# Patient Record
Sex: Male | Born: 1945 | Race: White | Hispanic: No | Marital: Married | Smoking: Never smoker
Health system: Southern US, Community
[De-identification: ages and names within clinical notes are randomized; demographics above are authoritative.]

## PROBLEM LIST (undated history)

## (undated) DIAGNOSIS — G4730 Sleep apnea, unspecified: Secondary | ICD-10-CM

## (undated) DIAGNOSIS — M1990 Unspecified osteoarthritis, unspecified site: Secondary | ICD-10-CM

## (undated) DIAGNOSIS — I499 Cardiac arrhythmia, unspecified: Secondary | ICD-10-CM

## (undated) DIAGNOSIS — K219 Gastro-esophageal reflux disease without esophagitis: Secondary | ICD-10-CM

## (undated) DIAGNOSIS — F419 Anxiety disorder, unspecified: Secondary | ICD-10-CM

## (undated) DIAGNOSIS — I480 Paroxysmal atrial fibrillation: Secondary | ICD-10-CM

## (undated) DIAGNOSIS — I10 Essential (primary) hypertension: Secondary | ICD-10-CM

## (undated) DIAGNOSIS — E119 Type 2 diabetes mellitus without complications: Secondary | ICD-10-CM

## (undated) DIAGNOSIS — Z86718 Personal history of other venous thrombosis and embolism: Secondary | ICD-10-CM

## (undated) DIAGNOSIS — IMO0001 Reserved for inherently not codable concepts without codable children: Secondary | ICD-10-CM

## (undated) HISTORY — PX: KNEE SURGERY: SHX244

## (undated) HISTORY — PX: 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 SWITCH): SHX6667

## (undated) HISTORY — PX: ROTATOR CUFF REPAIR: SHX139

## (undated) HISTORY — PX: CARDIAC CATHETERIZATION: SHX172

## (undated) HISTORY — PX: PENILE PROSTHESIS IMPLANT: SHX240

## (undated) HISTORY — PX: CHOLECYSTECTOMY: SHX55

---

## 2005-08-13 ENCOUNTER — Other Ambulatory Visit: Payer: Self-pay

## 2005-08-13 ENCOUNTER — Observation Stay: Payer: Self-pay | Admitting: Internal Medicine

## 2006-03-17 ENCOUNTER — Other Ambulatory Visit: Payer: Self-pay

## 2006-03-24 ENCOUNTER — Ambulatory Visit: Payer: Self-pay | Admitting: Specialist

## 2006-05-10 ENCOUNTER — Ambulatory Visit: Payer: Self-pay | Admitting: Specialist

## 2010-06-04 ENCOUNTER — Emergency Department: Payer: Self-pay | Admitting: Emergency Medicine

## 2012-05-16 ENCOUNTER — Ambulatory Visit: Payer: Self-pay | Admitting: Urology

## 2012-05-16 LAB — BASIC METABOLIC PANEL
Calcium, Total: 8.5 mg/dL (ref 8.5–10.1)
Chloride: 105 mmol/L (ref 98–107)
EGFR (African American): >60
EGFR (Non-African Amer.): >60
Glucose: 321 mg/dL — ABNORMAL HIGH (ref 65–99)
Osmolality: 293 (ref 275–301)

## 2012-05-16 LAB — HEMOGLOBIN: HGB: 15.3 g/dL (ref 13.0–18.0)

## 2012-05-20 ENCOUNTER — Encounter (HOSPITAL_COMMUNITY): Payer: Self-pay | Admitting: *Deleted

## 2012-05-20 ENCOUNTER — Emergency Department (HOSPITAL_COMMUNITY)
Admission: EM | Admit: 2012-05-20 | Discharge: 2012-05-20 | Disposition: A | Discharge status: Home / Self Care | Payer: BC Managed Care – PPO | Attending: Emergency Medicine | Admitting: Emergency Medicine

## 2012-05-20 DIAGNOSIS — R109 Unspecified abdominal pain: Secondary | ICD-10-CM

## 2012-05-20 DIAGNOSIS — E162 Hypoglycemia, unspecified: Secondary | ICD-10-CM

## 2012-05-20 DIAGNOSIS — N289 Disorder of kidney and ureter, unspecified: Secondary | ICD-10-CM | POA: Insufficient documentation

## 2012-05-20 DIAGNOSIS — I959 Hypotension, unspecified: Secondary | ICD-10-CM | POA: Insufficient documentation

## 2012-05-20 DIAGNOSIS — E119 Type 2 diabetes mellitus without complications: Secondary | ICD-10-CM | POA: Insufficient documentation

## 2012-05-20 DIAGNOSIS — M129 Arthropathy, unspecified: Secondary | ICD-10-CM | POA: Insufficient documentation

## 2012-05-20 HISTORY — DX: Type 2 diabetes mellitus without complications: E11.9

## 2012-05-20 HISTORY — DX: Unspecified osteoarthritis, unspecified site: M19.90

## 2012-05-20 LAB — CBC
HCT: 44.7 % (ref 39.0–52.0)
Hemoglobin: 15.4 g/dL (ref 13.0–17.0)
MCH: 31 pg (ref 26.0–34.0)
MCHC: 34.5 g/dL (ref 30.0–36.0)
MCV: 89.9 fL (ref 78.0–100.0)
Platelets: 197 10*3/uL (ref 150–400)
RBC: 4.97 MIL/uL (ref 4.22–5.81)
RDW: 13.3 % (ref 11.5–15.5)
WBC: 14.1 10*3/uL — ABNORMAL HIGH (ref 4.0–10.5)

## 2012-05-20 LAB — COMPREHENSIVE METABOLIC PANEL
Albumin: 4.1 g/dL (ref 3.5–5.2)
BUN: 19 mg/dL (ref 6–23)
Creatinine, Ser: 1.87 mg/dL — ABNORMAL HIGH (ref 0.50–1.35)
GFR calc Af Amer: 42 mL/min — ABNORMAL LOW (ref >90)
GFR calc non Af Amer: 36 mL/min — ABNORMAL LOW (ref >90)
Total Bilirubin: 0.6 mg/dL (ref 0.3–1.2)
Total Protein: 6.8 g/dL (ref 6.0–8.3)

## 2012-05-20 LAB — TROPONIN I: Troponin I: <0.3 ng/mL (ref <0.30)

## 2012-05-20 LAB — LIPASE, BLOOD: Lipase: 44 U/L (ref 11–59)

## 2012-05-20 LAB — POCT I-STAT TROPONIN I

## 2012-05-20 NOTE — ED Provider Notes (Signed)
  Physical Exam  BP 114/65  Pulse 85  Temp(Src) 97.1 F (36.2 C) (Oral)  Resp 15  Ht 5\' 8"  (1.727 m)  Wt 271 lb (122.925 kg)  BMI 41.22 kg/m2  SpO2 97%  Physical Exam  ED Course  Procedures  MDM Pt reevaluated - feeling good, has normal VS, labs show no elevation or rise in troponin.  Stable for d/c.        Vida Roller, MD 05/20/12 [phone]

## 2012-05-20 NOTE — ED Provider Notes (Addendum)
D6-year-old male with history of diabetes states that at about 11:30 AM, he had symptoms of hypoglycemia with dizziness and sweating. However, at the same time, he also had upper abdominal cramping pain which was mild. He rated the pain at 4/10. He took several glucose tablets and felt better but blood pressure at the scene was reported to be 82/45. Blood sugar was 238 after his glucose tablets. He has been pain-free since then. He denies chest pain, heaviness, tightness, pressure. Not nausea, vomiting. He has not had any recurrence of pain since his sugar has come back up. On exam, vital signs are significant for tachycardia with heart rate of 101. Oxygen saturation is 95% which is normal. Lungs are clear and heart has regular rate rhythm. Abdomen is soft and nontender without masses or hepatosplenomegaly. Extremities have no cyanosis or edema. He has received a liter of fluid and is blood pressures been stable as far. ECG is unremarkable. He will have cardiac markers checked and repeated in 3 hours and discharged if all negative.   Date: 05/20/2012  Rate: 100  Rhythm: normal sinus rhythm  QRS Axis: left  Intervals: normal  ST/T Wave abnormalities: nonspecific T wave changes  Conduction Disutrbances:none  Narrative Interpretation: Left axis deviation, nonspecific T-wave flattening. No prior ECG available for comparison.  Old EKG Reviewed: none available  Dustin Booze, MD 05/20/12 1338  He has continued to be pain-free in the ED. Troponin is negative and will be repeated. Mild renal insufficiency is noted on metabolic panel. He is not aware of prior history of elevated creatinine so he is referred back to PCP for further evaluation regarding this.  Results for orders placed during the hospital encounter of 05/20/12  CBC      Result Value Range   WBC 14.1 (*) 4.0 - 10.5 K/uL   RBC 4.97  4.22 - 5.81 MIL/uL   Hemoglobin 15.4  13.0 - 17.0 g/dL   HCT 82.9  56.2 - 13.0 %   MCV 89.9  78.0 - 100.0 fL    MCH 31.0  26.0 - 34.0 pg   MCHC 34.5  30.0 - 36.0 g/dL   RDW 86.5  78.4 - 69.6 %   Platelets 197  150 - 400 K/uL  LIPASE, BLOOD      Result Value Range   Lipase 44  11 - 59 U/L  COMPREHENSIVE METABOLIC PANEL      Result Value Range   Sodium 140  135 - 145 mEq/L   Potassium 4.3  3.5 - 5.1 mEq/L   Chloride 104  96 - 112 mEq/L   CO2 22  19 - 32 mEq/L   Glucose, Bld 249 (*) 70 - 99 mg/dL   BUN 19  6 - 23 mg/dL   Creatinine, Ser 2.95 (*) 0.50 - 1.35 mg/dL   Calcium 9.3  8.4 - 28.4 mg/dL   Total Protein 6.8  6.0 - 8.3 g/dL   Albumin 4.1  3.5 - 5.2 g/dL   AST 21  0 - 37 U/L   ALT 19  0 - 53 U/L   Alkaline Phosphatase 76  39 - 117 U/L   Total Bilirubin 0.6  0.3 - 1.2 mg/dL   GFR calc non Af Amer 36 (*) >90 mL/min   GFR calc Af Amer 42 (*) >90 mL/min  CG4 I-STAT (LACTIC ACID)      Result Value Range   Lactic Acid, Venous 1.31  0.5 - 2.2 mmol/L  POCT I-STAT TROPONIN I  Result Value Range   Troponin i, poc 0.01  0.00 - 0.08 ng/mL   Comment 3            Medical screening examination/treatment/procedure(s) were conducted as a shared visit with non-physician practitioner(s) and myself.  I personally evaluated the patient during the encounter    Dustin Booze, MD 05/20/12 [phone]

## 2012-05-20 NOTE — ED Notes (Signed)
Denies abd pain presently. States it was generalized mid abd area, very brief prior to taking instant glucose. Reports was feeling dizzy, lightheaded but now feels fine. Pt with no voiced complaints.  "I feel like I could run a marathon now".

## 2012-05-20 NOTE — ED Notes (Signed)
C/o sudden onset mid abd pain, felt like blood sugar was low, took 2 instant glucose tabs. Upon EMS arrival CBG 238, BP 82/45, HR 100-120 skin clammy, A&OX4. Denies abd pain presently

## 2012-05-20 NOTE — ED Notes (Signed)
Pt undressed, in gown, on monitor, continuous pulse oximetry and blood pressure cuff 

## 2012-05-20 NOTE — ED Provider Notes (Signed)
History     CSN: [id number]  Arrival date & time 05/20/12  1234   First MD Initiated Contact with Patient 05/20/12 1239      Chief Complaint  Patient presents with  . Abdominal Pain  . Hypoglycemia    (Consider location/radiation/quality/duration/timing/severity/associated sxs/prior treatment) Patient is a 67 y.o. male presenting with abdominal pain. The history is provided by the patient. No language interpreter was used.  Abdominal Pain Pain location:  Generalized Pain quality: dull   Pain radiates to:  Does not radiate Pain severity:  Mild Onset quality:  Gradual Timing:  Constant Progression:  Improving Chronicity:  New Context: diet changes   Relieved by: 2 instant glucose tabs. Associated symptoms: no belching, no chest pain, no chills, no diarrhea, no nausea and no vomiting   Pt brought in via EMS for abdominal pain and low blood glucose.  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  States he began experiencing sudden stomach pain around 1100 this morning.  Believed his blood glucose was low so took 2 glucose tabs.  Upon EMS arrival CBG 238, BP 82/45, HR 100-120, skin clammy. Pt denied abdominal pain upon arrival to ED.  States he feels much better "i can run a marathon." PMH: DM.    Past Medical History  Diagnosis Date  . Diabetes mellitus without complication   . Arthritis     History reviewed. No pertinent past surgical history.  No family history on file.  History  Substance Use Topics  . Smoking status: Not on file  . Smokeless tobacco: Not on file  . Alcohol Use: Not on file      Review of Systems  Constitutional: Negative for chills.  Cardiovascular: Negative for chest pain.  Gastrointestinal: Positive for abdominal pain. Negative for nausea, vomiting and diarrhea.    Allergies  Review of patient's allergies indicates no known allergies.  Home Medications   Current Outpatient Rx  Name  Route  Sig  Dispense   Refill  . Dextrose, Diabetic Use, (GLUCOSE PO)   Oral   Take 2 tablets by mouth as needed (for low blood sugar).            BP 111/57  Pulse 79  Temp(Src) 97.1 F (36.2 C) (Oral)  Resp 16  Ht 5\' 8"  (1.727 m)  Wt 271 lb (122.925 kg)  BMI 41.22 kg/m2  SpO2 97%  Physical Exam  Nursing note and vitals reviewed. Constitutional: He appears well-developed and well-nourished. No distress.  Morbidly obese male resting comfortably on exam bed  HENT:  Head: Normocephalic and atraumatic.  Eyes: Conjunctivae are normal.  Neck: Normal range of motion.  Cardiovascular: Normal rate and regular rhythm.   Pulmonary/Chest: Effort normal and breath sounds normal. No respiratory distress. He has no wheezes. He has no rales. He exhibits no tenderness.  Abdominal: Soft. Bowel sounds are normal. He exhibits no distension and no mass. There is tenderness ( mild TTP 3in left of umbilicous ). There is no rebound and no guarding.  Musculoskeletal: Normal range of motion.  Neurological: He is alert.  Skin: Skin is warm and dry. He is not diaphoretic.    ED Course  Procedures (including critical care time)  Labs Reviewed  CBC - Abnormal; Notable for the following:    WBC 14.1 (*)    All other components within normal limits  COMPREHENSIVE METABOLIC PANEL - Abnormal; Notable for the following:    Glucose, Bld 249 (*)  Creatinine, Ser 1.87 (*)    GFR calc non Af Amer 36 (*)    GFR calc Af Amer 42 (*)    All other components within normal limits  LIPASE, BLOOD  TROPONIN I  CG4 I-STAT (LACTIC ACID)  POCT I-STAT TROPONIN I  POCT I-STAT TROPONIN I   No results found.   1. Hypoglycemia   2. Hypotension   3. Abdominal pain   4. Renal insufficiency       MDM  Pt with DM II brought in via EMS for sudden onset abdominal pain. Pt felt his blood sugar was low so took 2 instant glucose tabs.  States by the time EMS arrived he was feeling better.  EMS arrival CBG-238, BP 82/45, HR 100-120 skin  clammy.    Consulted with Dr. Preston Fleeting.  Concerned about initial BP from EMS.  Will observe in ED and obtain labs.   EKG is neg for STEMI  10:31 AM Pt requesting something to eat and drink.  States he still feels just fine and ready to get out of here once we let him know everything is "okay"   Told pt we will be getting another lab (troponin) at 1700.    CBC: leukocytosis  Cr: 1.87  Filed Vitals:   05/20/12 1730  BP: 111/57  Pulse: 79  Temp:   Resp: 16   Will sign out to Dr. Hyacinth Meeker.  Pt will likely be discharge home, pending 2nd troponin.    Junius Finner, PA-C 05/21/12 1031

## 2012-05-22 ENCOUNTER — Ambulatory Visit: Payer: Self-pay | Admitting: Urology

## 2012-10-21 ENCOUNTER — Inpatient Hospital Stay: Payer: Self-pay | Admitting: Internal Medicine

## 2012-10-21 LAB — CBC
HCT: 51.9 % (ref 40.0–52.0)
MCH: 30.6 pg (ref 26.0–34.0)
MCHC: 33.5 g/dL (ref 32.0–36.0)
MCV: 91 fL (ref 80–100)
Platelet: 183 10*3/uL (ref 150–440)
RDW: 14.3 % (ref 11.5–14.5)

## 2012-10-21 LAB — COMPREHENSIVE METABOLIC PANEL
Chloride: 108 mmol/L — ABNORMAL HIGH (ref 98–107)
Creatinine: 2.35 mg/dL — ABNORMAL HIGH (ref 0.60–1.30)
EGFR (Non-African Amer.): 28 — ABNORMAL LOW
Glucose: 209 mg/dL — ABNORMAL HIGH (ref 65–99)
Osmolality: 281 (ref 275–301)
Potassium: 4.7 mmol/L (ref 3.5–5.1)
SGOT(AST): 39 U/L — ABNORMAL HIGH (ref 15–37)
SGPT (ALT): 33 U/L (ref 12–78)
Total Protein: 7.9 g/dL (ref 6.4–8.2)

## 2012-10-21 LAB — URINALYSIS, COMPLETE
Ketone: NEGATIVE
Leukocyte Esterase: NEGATIVE
Ph: 5 (ref 4.5–8.0)
RBC,UR: 1 /HPF (ref 0–5)
Specific Gravity: 1.018 (ref 1.003–1.030)

## 2012-10-21 LAB — TROPONIN I
Troponin-I: 0.03 ng/mL
Troponin-I: 0.05 ng/mL

## 2012-10-22 LAB — LIPID PANEL
Ldl Cholesterol, Calc: 77 mg/dL (ref 0–100)
Triglycerides: 166 mg/dL (ref 0–200)

## 2012-10-22 LAB — BASIC METABOLIC PANEL
Anion Gap: 6 — ABNORMAL LOW (ref 7–16)
Calcium, Total: 7.9 mg/dL — ABNORMAL LOW (ref 8.5–10.1)
Chloride: 110 mmol/L — ABNORMAL HIGH (ref 98–107)
EGFR (Non-African Amer.): 52 — ABNORMAL LOW
Osmolality: 288 (ref 275–301)
Sodium: 140 mmol/L (ref 136–145)

## 2012-10-22 LAB — TROPONIN I: Troponin-I: 0.06 ng/mL — ABNORMAL HIGH

## 2012-10-22 LAB — CBC WITH DIFFERENTIAL/PLATELET
Basophil #: 0.1 10*3/uL (ref 0.0–0.1)
Basophil %: 0.6 %
Eosinophil %: 2.5 %
Lymphocyte #: 0.8 10*3/uL — ABNORMAL LOW (ref 1.0–3.6)
Lymphocyte %: 9.6 %
MCHC: 33.5 g/dL (ref 32.0–36.0)
Monocyte #: 0.9 x10 3/mm (ref 0.2–1.0)
Neutrophil #: 6.7 10*3/uL — ABNORMAL HIGH (ref 1.4–6.5)
Platelet: 143 10*3/uL — ABNORMAL LOW (ref 150–440)
RBC: 4.97 10*6/uL (ref 4.40–5.90)
RDW: 14 % (ref 11.5–14.5)
WBC: 8.7 10*3/uL (ref 3.8–10.6)

## 2013-07-14 ENCOUNTER — Emergency Department: Payer: Self-pay | Admitting: Emergency Medicine

## 2013-07-14 LAB — COMPREHENSIVE METABOLIC PANEL
ALBUMIN: 4.7 g/dL (ref 3.4–5.0)
ALK PHOS: 82 U/L
Anion Gap: 7 (ref 7–16)
BUN: 33 mg/dL — ABNORMAL HIGH (ref 7–18)
Bilirubin,Total: 0.5 mg/dL (ref 0.2–1.0)
CALCIUM: 9.7 mg/dL (ref 8.5–10.1)
CO2: 23 mmol/L (ref 21–32)
CREATININE: 2.34 mg/dL — AB (ref 0.60–1.30)
Chloride: 109 mmol/L — ABNORMAL HIGH (ref 98–107)
EGFR (African American): 33 — ABNORMAL LOW
GFR CALC NON AF AMER: 28 — AB
GLUCOSE: 204 mg/dL — AB (ref 65–99)
Osmolality: 291 (ref 275–301)
POTASSIUM: 4.4 mmol/L (ref 3.5–5.1)
SGOT(AST): 40 U/L — ABNORMAL HIGH (ref 15–37)
SGPT (ALT): 38 U/L (ref 12–78)
SODIUM: 139 mmol/L (ref 136–145)
TOTAL PROTEIN: 8.1 g/dL (ref 6.4–8.2)

## 2013-07-14 LAB — CBC
HCT: 45.4 % (ref 40.0–52.0)
HGB: 15.1 g/dL (ref 13.0–18.0)
MCH: 30.8 pg (ref 26.0–34.0)
MCHC: 33.4 g/dL (ref 32.0–36.0)
MCV: 92 fL (ref 80–100)
Platelet: 212 10*3/uL (ref 150–440)
RBC: 4.92 10*6/uL (ref 4.40–5.90)
RDW: 13.3 % (ref 11.5–14.5)
WBC: 11.7 10*3/uL — AB (ref 3.8–10.6)

## 2013-07-14 LAB — CK: CK, Total: 481 U/L — ABNORMAL HIGH

## 2013-07-24 DIAGNOSIS — E785 Hyperlipidemia, unspecified: Secondary | ICD-10-CM | POA: Insufficient documentation

## 2013-07-24 DIAGNOSIS — E119 Type 2 diabetes mellitus without complications: Secondary | ICD-10-CM | POA: Insufficient documentation

## 2013-07-24 DIAGNOSIS — I10 Essential (primary) hypertension: Secondary | ICD-10-CM | POA: Insufficient documentation

## 2013-08-25 ENCOUNTER — Emergency Department: Payer: Self-pay | Admitting: Internal Medicine

## 2013-08-25 LAB — COMPREHENSIVE METABOLIC PANEL
ALBUMIN: 3.9 g/dL (ref 3.4–5.0)
AST: 11 U/L — AB (ref 15–37)
Alkaline Phosphatase: 86 U/L
Anion Gap: 9 (ref 7–16)
BUN: 34 mg/dL — ABNORMAL HIGH (ref 7–18)
Bilirubin,Total: 0.3 mg/dL (ref 0.2–1.0)
CHLORIDE: 102 mmol/L (ref 98–107)
Calcium, Total: 8.9 mg/dL (ref 8.5–10.1)
Co2: 24 mmol/L (ref 21–32)
Creatinine: 1.63 mg/dL — ABNORMAL HIGH (ref 0.60–1.30)
EGFR (African American): 50 — ABNORMAL LOW
EGFR (Non-African Amer.): 44 — ABNORMAL LOW
GLUCOSE: 415 mg/dL — AB (ref 65–99)
OSMOLALITY: 295 (ref 275–301)
Potassium: 4.2 mmol/L (ref 3.5–5.1)
SGPT (ALT): 31 U/L (ref 12–78)
SODIUM: 135 mmol/L — AB (ref 136–145)
Total Protein: 7.4 g/dL (ref 6.4–8.2)

## 2013-08-25 LAB — URINALYSIS, COMPLETE
BACTERIA: NONE SEEN
BILIRUBIN, UR: NEGATIVE
BLOOD: NEGATIVE
Glucose,UR: >=500 mg/dL (ref 0–75)
Ketone: NEGATIVE
Leukocyte Esterase: NEGATIVE
NITRITE: NEGATIVE
PH: 6 (ref 4.5–8.0)
PROTEIN: NEGATIVE
RBC, UR: NONE SEEN /HPF (ref 0–5)
Specific Gravity: 1.024 (ref 1.003–1.030)
Squamous Epithelial: NONE SEEN
WBC UR: <1 /HPF (ref 0–5)

## 2013-08-25 LAB — CBC WITH DIFFERENTIAL/PLATELET
Basophil #: 0.1 10*3/uL (ref 0.0–0.1)
Basophil %: 0.5 %
EOS ABS: 0 10*3/uL (ref 0.0–0.7)
Eosinophil %: 0.1 %
HCT: 46.7 % (ref 40.0–52.0)
HGB: 15.3 g/dL (ref 13.0–18.0)
Lymphocyte #: 1.6 10*3/uL (ref 1.0–3.6)
Lymphocyte %: 12.4 %
MCH: 30.7 pg (ref 26.0–34.0)
MCHC: 32.8 g/dL (ref 32.0–36.0)
MCV: 94 fL (ref 80–100)
Monocyte #: 0.7 x10 3/mm (ref 0.2–1.0)
Monocyte %: 5.7 %
NEUTROS PCT: 81.3 %
Neutrophil #: 10.5 10*3/uL — ABNORMAL HIGH (ref 1.4–6.5)
Platelet: 196 10*3/uL (ref 150–440)
RBC: 4.99 10*6/uL (ref 4.40–5.90)
RDW: 13.3 % (ref 11.5–14.5)
WBC: 13 10*3/uL — AB (ref 3.8–10.6)

## 2013-09-20 ENCOUNTER — Ambulatory Visit: Payer: Self-pay | Admitting: Internal Medicine

## 2013-10-16 ENCOUNTER — Ambulatory Visit: Payer: Self-pay | Admitting: Oncology

## 2013-10-27 ENCOUNTER — Inpatient Hospital Stay: Payer: Self-pay | Admitting: Specialist

## 2013-10-27 LAB — CBC WITH DIFFERENTIAL/PLATELET
BASOS ABS: 0.1 10*3/uL (ref 0.0–0.1)
Basophil %: 0.8 %
EOS ABS: 0.1 10*3/uL (ref 0.0–0.7)
Eosinophil %: 1 %
HCT: 45.3 % (ref 40.0–52.0)
HGB: 14.8 g/dL (ref 13.0–18.0)
Lymphocyte #: 1.2 10*3/uL (ref 1.0–3.6)
Lymphocyte %: 11 %
MCH: 30.8 pg (ref 26.0–34.0)
MCHC: 32.6 g/dL (ref 32.0–36.0)
MCV: 94 fL (ref 80–100)
MONO ABS: 0.8 x10 3/mm (ref 0.2–1.0)
MONOS PCT: 6.6 %
Neutrophil #: 9.1 10*3/uL — ABNORMAL HIGH (ref 1.4–6.5)
Neutrophil %: 80.6 %
Platelet: 160 10*3/uL (ref 150–440)
RBC: 4.79 10*6/uL (ref 4.40–5.90)
RDW: 13.8 % (ref 11.5–14.5)
WBC: 11.3 10*3/uL — AB (ref 3.8–10.6)

## 2013-10-27 LAB — BASIC METABOLIC PANEL
ANION GAP: 9 (ref 7–16)
BUN: 24 mg/dL — AB (ref 7–18)
CALCIUM: 8.9 mg/dL (ref 8.5–10.1)
CHLORIDE: 110 mmol/L — AB (ref 98–107)
CO2: 22 mmol/L (ref 21–32)
CREATININE: 1.32 mg/dL — AB (ref 0.60–1.30)
EGFR (African American): >60
GFR CALC NON AF AMER: 56 — AB
Glucose: 229 mg/dL — ABNORMAL HIGH (ref 65–99)
Osmolality: 293 (ref 275–301)
POTASSIUM: 4.1 mmol/L (ref 3.5–5.1)
Sodium: 141 mmol/L (ref 136–145)

## 2013-10-27 LAB — PROTIME-INR
INR: 1.1
Prothrombin Time: 13.9 secs (ref 11.5–14.7)

## 2013-10-27 LAB — TROPONIN I
TROPONIN-I: 0.9 ng/mL — AB
Troponin-I: 0.79 ng/mL — ABNORMAL HIGH
Troponin-I: 0.7 ng/mL — ABNORMAL HIGH

## 2013-10-27 LAB — APTT: Activated PTT: 29.3 secs (ref 23.6–35.9)

## 2013-10-27 LAB — CK-MB
CK-MB: 6.4 ng/mL — AB (ref 0.5–3.6)
CK-MB: 6 ng/mL — ABNORMAL HIGH (ref 0.5–3.6)
CK-MB: 6 ng/mL — ABNORMAL HIGH (ref 0.5–3.6)

## 2013-10-27 LAB — HEPARIN LEVEL (UNFRACTIONATED): Anti-Xa(Unfractionated): 0.24 IU/mL — ABNORMAL LOW (ref 0.30–0.70)

## 2013-10-28 LAB — BASIC METABOLIC PANEL
Anion Gap: 5 — ABNORMAL LOW (ref 7–16)
BUN: 20 mg/dL — ABNORMAL HIGH (ref 7–18)
CREATININE: 1.23 mg/dL (ref 0.60–1.30)
Calcium, Total: 8.7 mg/dL (ref 8.5–10.1)
Chloride: 110 mmol/L — ABNORMAL HIGH (ref 98–107)
Co2: 24 mmol/L (ref 21–32)
EGFR (African American): >60
Glucose: 224 mg/dL — ABNORMAL HIGH (ref 65–99)
OSMOLALITY: 287 (ref 275–301)
Potassium: 4.1 mmol/L (ref 3.5–5.1)
Sodium: 139 mmol/L (ref 136–145)

## 2013-10-28 LAB — URINALYSIS, COMPLETE
Bacteria: NONE SEEN
Bilirubin,UR: NEGATIVE
Blood: NEGATIVE
Ketone: NEGATIVE
Leukocyte Esterase: NEGATIVE
Nitrite: NEGATIVE
PROTEIN: NEGATIVE
Ph: 5 (ref 4.5–8.0)
RBC,UR: <1 /HPF (ref 0–5)
Specific Gravity: 1.02 (ref 1.003–1.030)
Squamous Epithelial: NONE SEEN

## 2013-10-28 LAB — CBC WITH DIFFERENTIAL/PLATELET
BASOS PCT: 0.6 %
Basophil #: 0.1 10*3/uL (ref 0.0–0.1)
EOS ABS: 0.2 10*3/uL (ref 0.0–0.7)
EOS PCT: 2.4 %
HCT: 43.4 % (ref 40.0–52.0)
HGB: 14.4 g/dL (ref 13.0–18.0)
Lymphocyte #: 1.5 10*3/uL (ref 1.0–3.6)
Lymphocyte %: 15.4 %
MCH: 31.2 pg (ref 26.0–34.0)
MCHC: 33.2 g/dL (ref 32.0–36.0)
MCV: 94 fL (ref 80–100)
Monocyte #: 0.7 x10 3/mm (ref 0.2–1.0)
Monocyte %: 7.2 %
NEUTROS PCT: 74.4 %
Neutrophil #: 7 10*3/uL — ABNORMAL HIGH (ref 1.4–6.5)
PLATELETS: 152 10*3/uL (ref 150–440)
RBC: 4.63 10*6/uL (ref 4.40–5.90)
RDW: 13.5 % (ref 11.5–14.5)
WBC: 9.5 10*3/uL (ref 3.8–10.6)

## 2013-10-28 LAB — PROTIME-INR
INR: 1.1
Prothrombin Time: 14 secs (ref 11.5–14.7)

## 2013-10-28 LAB — LIPID PANEL
Cholesterol: 153 mg/dL (ref 0–200)
HDL Cholesterol: 31 mg/dL — ABNORMAL LOW (ref 40–60)
Ldl Cholesterol, Calc: 79 mg/dL (ref 0–100)
Triglycerides: 213 mg/dL — ABNORMAL HIGH (ref 0–200)
VLDL Cholesterol, Calc: 43 mg/dL — ABNORMAL HIGH (ref 5–40)

## 2013-10-28 LAB — HEPARIN LEVEL (UNFRACTIONATED): ANTI-XA(UNFRACTIONATED): 0.48 [IU]/mL (ref 0.30–0.70)

## 2013-10-28 LAB — TSH: THYROID STIMULATING HORM: 2.55 u[IU]/mL

## 2013-10-28 LAB — HEMOGLOBIN A1C: Hemoglobin A1C: 7.8 % — ABNORMAL HIGH (ref 4.2–6.3)

## 2013-10-29 LAB — HEPARIN LEVEL (UNFRACTIONATED): ANTI-XA(UNFRACTIONATED): 0.51 [IU]/mL (ref 0.30–0.70)

## 2013-10-29 LAB — CBC WITH DIFFERENTIAL/PLATELET
BASOS PCT: 0.3 %
Basophil #: 0 10*3/uL (ref 0.0–0.1)
EOS ABS: 0.2 10*3/uL (ref 0.0–0.7)
EOS PCT: 1.9 %
HCT: 43.9 % (ref 40.0–52.0)
HGB: 14.3 g/dL (ref 13.0–18.0)
Lymphocyte #: 1.6 10*3/uL (ref 1.0–3.6)
Lymphocyte %: 15.6 %
MCH: 30.8 pg (ref 26.0–34.0)
MCHC: 32.7 g/dL (ref 32.0–36.0)
MCV: 94 fL (ref 80–100)
Monocyte #: 0.7 x10 3/mm (ref 0.2–1.0)
Monocyte %: 7.1 %
NEUTROS ABS: 7.7 10*3/uL — AB (ref 1.4–6.5)
Neutrophil %: 75.1 %
Platelet: 168 10*3/uL (ref 150–440)
RBC: 4.65 10*6/uL (ref 4.40–5.90)
RDW: 13.5 % (ref 11.5–14.5)
WBC: 10.3 10*3/uL (ref 3.8–10.6)

## 2013-10-29 LAB — PROTIME-INR
INR: 1.2
Prothrombin Time: 15.2 secs — ABNORMAL HIGH (ref 11.5–14.7)

## 2013-10-30 LAB — CBC WITH DIFFERENTIAL/PLATELET
BASOS ABS: 0.3 10*3/uL — AB (ref 0.0–0.1)
BASOS PCT: 3.3 %
Eosinophil #: 0.2 10*3/uL (ref 0.0–0.7)
Eosinophil %: 2.2 %
HCT: 44.4 % (ref 40.0–52.0)
HGB: 14.7 g/dL (ref 13.0–18.0)
LYMPHS ABS: 1.3 10*3/uL (ref 1.0–3.6)
LYMPHS PCT: 13.1 %
MCH: 31.2 pg (ref 26.0–34.0)
MCHC: 33.2 g/dL (ref 32.0–36.0)
MCV: 94 fL (ref 80–100)
MONO ABS: 0.8 x10 3/mm (ref 0.2–1.0)
MONOS PCT: 8.2 %
Neutrophil #: 7.3 10*3/uL — ABNORMAL HIGH (ref 1.4–6.5)
Neutrophil %: 73.2 %
Platelet: 165 10*3/uL (ref 150–440)
RBC: 4.72 10*6/uL (ref 4.40–5.90)
RDW: 13.5 % (ref 11.5–14.5)
WBC: 10 10*3/uL (ref 3.8–10.6)

## 2013-10-30 LAB — CANCER ANTIGEN 19-9: CA 19-9: 21 U/mL (ref 0–35)

## 2013-10-30 LAB — TSH: Thyroid Stimulating Horm: 3.41 u[IU]/mL

## 2013-10-30 LAB — CEA: CEA: 2.5 ng/mL (ref 0.0–4.7)

## 2013-10-30 LAB — PSA: PSA: 1.5 ng/mL (ref 0.0–4.0)

## 2013-11-15 ENCOUNTER — Ambulatory Visit: Payer: Self-pay | Admitting: Oncology

## 2013-12-16 ENCOUNTER — Ambulatory Visit: Payer: Self-pay | Admitting: Oncology

## 2013-12-26 ENCOUNTER — Ambulatory Visit: Payer: Self-pay | Admitting: Specialist

## 2014-02-25 ENCOUNTER — Ambulatory Visit: Payer: Self-pay | Admitting: Specialist

## 2014-02-25 LAB — CBC WITH DIFFERENTIAL/PLATELET
BASOS ABS: 0.1 10*3/uL (ref 0.0–0.1)
Basophil %: 0.5 %
EOS PCT: 2.1 %
Eosinophil #: 0.2 10*3/uL (ref 0.0–0.7)
HCT: 49.7 % (ref 40.0–52.0)
HGB: 16.1 g/dL (ref 13.0–18.0)
LYMPHS ABS: 1.7 10*3/uL (ref 1.0–3.6)
LYMPHS PCT: 15.5 %
MCH: 30.1 pg (ref 26.0–34.0)
MCHC: 32.4 g/dL (ref 32.0–36.0)
MCV: 93 fL (ref 80–100)
Monocyte #: 0.7 x10 3/mm (ref 0.2–1.0)
Monocyte %: 6.7 %
NEUTROS PCT: 75.2 %
Neutrophil #: 8.3 10*3/uL — ABNORMAL HIGH (ref 1.4–6.5)
PLATELETS: 258 10*3/uL (ref 150–440)
RBC: 5.36 10*6/uL (ref 4.40–5.90)
RDW: 14 % (ref 11.5–14.5)
WBC: 11.1 10*3/uL — ABNORMAL HIGH (ref 3.8–10.6)

## 2014-02-25 LAB — BASIC METABOLIC PANEL
Anion Gap: 9 (ref 7–16)
BUN: 17 mg/dL (ref 7–18)
CALCIUM: 9.6 mg/dL (ref 8.5–10.1)
CO2: 25 mmol/L (ref 21–32)
Chloride: 106 mmol/L (ref 98–107)
Creatinine: 1.12 mg/dL (ref 0.60–1.30)
EGFR (African American): >60
GLUCOSE: 103 mg/dL — AB (ref 65–99)
Osmolality: 281 (ref 275–301)
POTASSIUM: 3.9 mmol/L (ref 3.5–5.1)
Sodium: 140 mmol/L (ref 136–145)

## 2014-03-06 ENCOUNTER — Ambulatory Visit: Payer: Self-pay | Admitting: Specialist

## 2014-04-26 ENCOUNTER — Emergency Department: Payer: Self-pay | Admitting: Emergency Medicine

## 2014-04-28 ENCOUNTER — Emergency Department: Payer: Self-pay | Admitting: Emergency Medicine

## 2014-06-07 NOTE — H&P (Signed)
PATIENT NAME:  Dustin Estrada, Dustin Estrada MR#:  [id number] DATE OF BIRTH:  [birth year]  DATE OF ADMISSION:  05/22/2012  CHIEF COMPLAINT: Erectile dysfunction.   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 He has used Viagra in the past, but it is no longer effective. He has reviewed all current available treatment options and prefers placement of a penile prosthesis. He comes in today for placement of the AMS 3-piece inflatable penile prosthesis.   PAST MEDICAL HISTORY: No drug allergies.   CURRENT MEDICATIONS: AcipHex, AndroGel, Humalog insulin, and multivitamins.   PREVIOUS SURGICAL PROCEDURES: Include left knee surgery in 2006, left ankle surgery in 1996, and left Achilles tendon repair 2002.   SOCIAL HISTORY: He denied tobacco or alcohol use.   FAMILY HISTORY: Remarkable for diabetes.   PAST AND CURRENT MEDICAL CONDITIONS: 1. Diabetes.  2. GERD.  REVIEW OF SYSTEMS: The patient denied chest pain, shortness of breath, stroke, or hypertension.   PHYSICAL EXAMINATION:  GENERAL: Obese white male in no acute distress.  HEENT: Sclerae were clear. Pupils were equally round to light and accommodation. Extraocular movements were intact.  NECK: Supple. No palpable cervical adenopathy. No audible carotid bruits.  LUNGS: Clear to auscultation.  HEART: Regular rhythm and rate without audible murmurs.  ABDOMEN: Soft, nontender abdomen.  GENITOURINARY: The patient had a buried penis. He was circumcised. Testes smooth and nontender, 20 mL in size each.  RECTAL: 35 gram smooth nontender prostate.  NEUROMUSCULAR: Alert and oriented x 3.   IMPRESSION:  1. Erectile dysfunction.  2. Diabetes.  3. Obesity.   PLAN: Placement of AMS 3-piece inflatable penile prosthesis.    ____________________________ Otelia Limes. Yves Dill, MD mrw:es D: 05/16/2012 13:23:00 ET T: 05/16/2012 14:09:35 ET JOB#: [id number]  cc: Otelia Limes.  Yves Dill, MD, <Dictator> Royston Cowper MD ELECTRONICALLY SIGNED 05/17/2012 12:30

## 2014-06-07 NOTE — Discharge Summary (Signed)
PATIENT NAME:  Dustin Estrada, Dustin Estrada MR#:  [id number] DATE OF BIRTH:  18-Mar-1945  DATE OF ADMISSION:  10/21/2012 DATE OF DISCHARGE:  10/22/2012  PRIMARY CARE PHYSICIAN:  Dr. Brynda Greathouse.   DISCHARGE DIAGNOSES: 1.  Acute renal failure secondary to dehydration.  2.  Diabetes mellitus type 2.   3.  Hypertension.  4.  Hyperlipidemia.   DISCHARGE MEDICATIONS:  1.  Pantoprazole40   mg p.o. daily.  2.  Tradjenta  5 mg p.o. daily.  3.  AndroGel 20.25 mg transdermal gel once a day.  4.  Humulin 70/30 45 units b.i.d.  5.  Lantus 20 units at bedtime this was confirmed that the patient does take both.  6.  Simvastatin 20 mg daily.  7.  The patient was advised to take lisinopril until Tuesday, so he is advised to stop until tomorrow.     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 The patient was doing auctioning outside in hot weather, suddenly felt dizzy. Initially thought he had hypoglycemia, but sugars were fine and because of chest pain, he felt that he needed to come to ER and the patient had to go for chest x-Blayden and he felt severely dizzy upon standing up and the patient admitted for chest pain and also acute renal failure. The patient's troponins were negative. Chest was pain was thought to be secondary to musculoskeletal.  1.  EKG: Normal sinus rhythm. No ST-T changes.  2.  Acute renal failure.  BUN 22, creatinine 2.37 on admission. The patient thought to have dehydration due to hot weather and not drinking enough water. He was started on IV fluids and the patient received normal saline. He got a liter in the Emergency Room and was started at  150 mL an hour.  The patient's lisinopril was also stopped. His creatinine today is 1.41 down from 2.35. Advised him to stop the lisinopril until tomorrow and then restart it.  3.  Diabetes mellitus type 2. He takes 70/30 and also Lantus. Hemoglobin A1c is 8.1. Advised him to follow up with his  doctor, Dr. Brynda Greathouse. Patient's chest x-rays showed some atelectasis, but he has no cough and  urine cultures are negative. Initially white count 13.6, thought to be the cause oreactive leucocytosis. Repeat white count was 8.7. Discharge  blood pressure is 147/74 and pulse is 70. The patient's blood pressure initially was 86/50. The hypotension caused him to have some dizziness and the patient's symptoms improved with the fluids and also holding the lisinopril.   TIME SPENT ON PATIENT:  More than 30 minutes. ____________________________ Epifanio Lesches, MD sk:cc D: 10/22/2012 21:25:18 ET T: 10/22/2012 21:49:30 ET JOB#: [id number]  cc: Epifanio Lesches, MD, <Dictator> Mikeal Hawthorne. Brynda Greathouse, MD  Epifanio Lesches MD ELECTRONICALLY SIGNED 11/05/2012 16:04

## 2014-06-07 NOTE — Op Note (Signed)
PATIENT NAME:  Dustin Estrada, Dustin Estrada MR#:  [id number] DATE OF BIRTH:  [birth year]  DATE OF PROCEDURE:  05/22/2012  PREOPERATIVE DIAGNOSIS:  Erectile dysfunction.   POSTOPERATIVE DIAGNOSES:  1.  Erectile dysfunction. 2.  Buried penis.   PROCEDURES: 1.  Placement of inflatable penile prosthesis.  2.  Scrotoplasty.  SURGEON: Maryan Puls, MD   ANESTHETIST: Dr. Marcello Moores.  ANESTHETIC METHOD: General.   INDICATIONS: See the dictated history and physical. After informed consent, the patient requests the above procedure.   OPERATIVE SUMMARY: After adequate general anesthesia had been obtained, the perineum was prepped and draped in the usual fashion. A 16-French Foley catheter was inserted in sterile fashion. A Scott retractor was placed. A transverse penoscrotal incision was made and carried down sharply through the skin and also sharply through Dartos fascia.  Corpora cavernosum and urethra were fully exposed. Skin hooks were placed. At this point, an incision was made in the left corpus cavernosum. Left corpus cavernosum was sequentially dilated up to 13 mm with the Hegar dilators. Left corpus cavernosum was irrigated with GU irrigant. The procedure was then repeated on the right side in an identical fashion. The Scott retractor was then removed. The proximal corporal length was noted to be 14 cm with a distal length of 4 cm. This was equal bilaterally. Therefore, the 12 cm cylinders with 6 cm rear tip extenders was selected. The 65 mL reservoir was selected. The right inguinal ring was palpated just above the pubic symphysis on the right side and transversalis fascia punctured with curved Mayo scissors. Space of Retzius was developed using finger dissection. Space of Retzius was then irrigated with GU irrigant. The 65 mL reservoir was then placed into this space. Reservoir was filled to 65 mL and no back pressure was noted. 60 mL was then left in the reservoir. The cylinders were then inserted using the  [facility] inserter. Surrogate reservoir test was performed and the patient was noted to have excellent rigid state as well as flaccid state without SST deformity or buckling. The corporotomies were then closed with running 2-0 Vicryl suture. The prosthesis was then again cycled and again excellent flaccid and rigid states were noted. A pocket was created in the right lower hemiscrotum using blunt dissection for the pump. The pump space was irrigated with GU irrigant and the pump was then anchored into position with a Babcock clamp. The cylinder assembly was then connected to the reservoir with a straight connector. The prosthesis was again cycled and excellent function was noted. Throughout the procedure the surgical field was intermittently irrigated with GU irrigant. The Dartos fascia was then reapproximated with a 2-0 Vicryl suture. The subcutaneous fascia was reapproximated with interrupted 4-0 Vicryl suture. The skin was reapproximated vertically in scrotoplasty fashion. There was some tension in the ventral foreskin so a relaxing foreskin incision was made and this was closed horizontally with 3-0 chromic. At this point, Vaseline gauze and sterile dressing were applied. Fluffs and scrotal supporter were applied. Sponge, needle and instrument counts were noted be correct. The procedure was then terminated and the patient was transferred to the recovery room in stable condition.  ____________________________ Otelia Limes. Yves Dill, MD mrw:sb D: 05/22/2012 10:27:36 ET T: 05/22/2012 11:02:44 ET JOB#: [id number]  cc: Otelia Limes. Yves Dill, MD, <Dictator> Royston Cowper MD ELECTRONICALLY SIGNED 05/22/2012 13:19

## 2014-06-07 NOTE — H&P (Signed)
PATIENT NAME:  Dustin Estrada, Dustin Estrada MR#:  [id number] DATE OF BIRTH:  1945-12-16  DATE OF ADMISSION:  10/21/2012  PRIMARY CARE PHYSICIAN: Dr. Nicky Pugh.   REFERRING PHYSICIAN:  Dr. Ferman Hamming.    CHIEF COMPLAINT:  Dizziness and left arm pain.   HISTORY OF PRESENT ILLNESS: A 69 year old male who was in his usual health and taking insulin injections for his diabetes until this morning. He took his insulin as scheduled and went to work.  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 His blood sugar level was fine and his troponin level was also 0.03 but he was found having acute renal failure and he had severe dizziness while standing up to give urine sample and while standing up to go to x-Chaim department. He was found having tachycardia and hypotension when arriving to the ER. So he was for admission for further work-up of his acute renal failure and severe dehydration. On further questioning, the patient denies any complaint, but he admits that he did not drink enough liquids today. He denies any similar complaint in the past of chest pain or arm pain and there is no pain in the chest currently but the pain is in the arm where it was in the morning.  REVIEW OF SYSTEMS:  CONSTITUTIONAL: Negative for fever, fatigue, weakness. Pain in the left arm which was present in the morning but currently the pain is gone.  No weight loss or weight gain.  EYES: No blurring, double vision or discharge.  EARS, NOSE, THROAT: No tinnitus, ear pain or hearing loss.  RESPIRATORY: No cough, wheezing, hemoptysis or shortness of breath.  CARDIOVASCULAR: No chest pain, orthopnea edema, arrhythmia, but was feeling dizzy.  GASTROINTESTINAL: No nausea, vomiting, diarrhea or abdominal pain.  GENITOURINARY: No dysuria, hematuria or increased frequency of urination.  ENDOCRINE: No increased  sweating. No heat or cold intolerance.  SKIN: No acne or rashes or lesions on the skin.  MUSCULOSKELETAL: No pain swelling on the joints.  NEUROLOGICAL: No numbness, weakness, dysarthria or tremors.  PSYCHIATRIC: No anxiety, insomnia or bipolar disorder.   PAST MEDICAL HISTORY: Diabetes.   PAST SURGICAL HISTORY: Penile implant for erectile dysfunction in April 2014 and had hip and knee surgeries in remote past.   SOCIAL HISTORY: He denies smoking. No drinking alcohol and no illegal drug use. He is a Teacher, music and works in Education officer, community.    FAMILY HISTORY: Coronary artery disease in multiple family members on mother's side and father had some kind of cancer. He does not know but he was undergoing radiation in the past, that is all he knows.  Father died long ago.     HOME MEDICATIONS:  The patient only take insulin which is 2 types, one he is taking 20 units 2 times a day and the other is 45 units 2 times a day. He does not remember the name of those insulins.    PHYSICAL EXAMINATION: VITAL SIGNS:  On presentation, temperature 97.1, pulse rate 101, respiration was 22, with 98% on room air oxygen saturation. His blood pressure was 86/50 on presentation.  After IV fluid his blood pressure on lying down came up to 135/63 and pulse rate 76, but during my examination, I  made him stand up and checked his vitals on standing up.  His pressure dropped to 100/57 and his  pulse came up to 94.  GENERAL: The patient is obese, fully alert and oriented to time, place and person and does not have any acute distress at this time. He is cooperative with history taking and physical examination.  HEAD AND NECK: Atraumatic. Conjunctivae pink. Oral mucosa dry.  NECK: Supple. No JVD.  RESPIRATORY: Bilateral clear and equal air entry.  CARDIOVASCULAR: S1, S2 present, regular. No murmur.  ABDOMEN: Soft, nontender.  Bowel sounds present. No organomegaly.  SKIN: No rashes.  LEGS: No edema.  NEUROLOGICAL: Power 5 out of 5.  Follows commands. No tremor or rigidity.  PSYCHIATRIC: Does not appear in any acute psychiatric illness at this time.   LABORATORY RESULTS:  Glucose 209, BUN 22, creatinine 2.35. In the past, in 05/2012, creatinine was 1.08. Sodium 136, potassium is not reported, chloride 108, CO2 21, calcium is 9.6. Total protein 7.9, albumin 4.4, bilirubin 0.8, alkaline phosphate 96. His troponin is 0.03. Total white cell count 13.6, hemoglobin 17.4, and platelets 183, MCV 91. Urinalysis is grossly negative. X-Rishan of the chest is done but not reviewed yet. I reviewed the chest x-Danthony.  There is not any congestion or infiltrate. Official report still pending.   EKG: Normal sinus rhythm with some sinus arrhythmia.   ASSESSMENT AND PLAN: A 69 year old male with past medical history of diabetes, who presented after standing long hours in the heat without drinking enough water, feeling left upper arm pain and found having severe dehydration, renal failure and orthostatic drop in vital.  1.  Acute renal failure. We will give IV fluid hydration. The patient is already feeling better. We will continue following renal function.  2.  Orthostatic drop in vitals.  The patient is feeling better now compared to the time he came. We will give IV fluids and continue monitoring. Most likely it appears due to dehydration that happened secondary to standing in hot weather without drinking enough water for long time.  3.  Chest pain. Had complaint of left upper arm pain and mild, dull pain in the chest, which is gone now. Most likely it was due to dehydration and stress, but we will check his troponin 3 times.  The times first troponin is negative.  4.  Elevated white cell count. Most likely it is due to stress. Will continue following chest x-Bradlee and urinalysis is negative. No fever.  5.  Diabetes. We will give him insulin sliding scale coverage.   Total Time Spent: 50 minutes.   CODE STATUS:      ____________________________ Ceasar Lund Anselm Jungling, MD [id number] D: 10/21/2012 14:00:18 ET T: 10/21/2012 14:26:19 ET JOB#: [id number]  cc: Ceasar Lund. Anselm Jungling, MD, <Dictator> Vaughan Basta MD ELECTRONICALLY SIGNED 10/21/2012 15:50

## 2014-06-08 NOTE — H&P (Signed)
PATIENT NAME:  Dustin Estrada, Dustin Estrada MR#:  [id number] DATE OF BIRTH:  06/21/1945  DATE OF ADMISSION:  10/27/2013  PRIMARY CARE PHYSICIAN: Jaquelyn Bitter B. Brynda Greathouse, MD  PRIMARY CARDIOLOGY: Dwayne D. Callwood, MD  PULMONARY: Herbon E. Raul Del, MD  CHIEF COMPLAINT: Near-syncope and shortness of breath.   HISTORY OF PRESENT ILLNESS: The patient is a 69 year old Caucasian male with past medical history of insulin-requiring diabetes mellitus, on Lantus, who has been experiencing intermittent episodes of shortness of breath for the past several months. Slowly, it has been getting worse. He was referred to cardiology, Dr. Clayborn Bigness. The patient was seen by Dr. Clayborn Bigness and had cardiac catheterization done on August 6th, which has revealed no blockages. The patient was also seen by his pulmonologist, Dr. Raul Del, as an outpatient. Today, the patient was feeling lightheaded and short of breath and came into the ED. CT angiogram of the chest was done which has revealed right submassive pulmonary embolism with right-sided strain. The ER physician has discussed this with on-call vascular surgeon, Dr. Delana Meyer, who has recommended medical management at this time. The patient is hemodynamically stable, and he is started on a heparin drip per protocol. Hospitalist team is called to admit the patient. During my examination, the patient denies any chest pain. The patient's troponin is elevated, which is probably from the underlying pulmonary embolism. The patient's wife and sister are at bedside. He denies any hormonal use or immobility in the past several months. He did not have any past medical history of pulmonary embolism or DVT.   PAST MEDICAL HISTORY:  1. Insulin-requiring diabetes mellitus.  2. Hyperlipidemia.  PAST SURGICAL HISTORY: 1. Penile implant for erectile dysfunction in August 2014.  2. Hip and knee surgery.  3. Recent history of cardiac catheterization in August, which was normal.   ALLERGIES: No known drug  allergies.   PSYCHOSOCIAL HISTORY: Lives at home with wife. Remote history of smoking as a teenager, but denies any current history of smoking, alcohol or illicit drug usage.   FAMILY HISTORY: Father had history of lymphoma, and diabetes runs in his family.   HOME MEDICATIONS:  1. Lantus 50 units twice a day.  2. Lisinopril 20 mg p.o. once daily.  3. Simvastatin 20 mg p.o. once daily. 4. Pantoprazole 20 mg p.o. once daily.  5. Ibuprofen 1 tablet p.o. 4 times a day.  REVIEW OF SYSTEMS: CONSTITUTIONAL: Denies any fever or fatigue. Denies any weakness.  EYES: Denies blurry vision, double vision, redness.  ENT: Denies epistaxis, discharge, tinnitus.  RESPIRATION: Denies cough, COPD. Has intermittent episodes of shortness of breath for the past several months.  CARDIOVASCULAR: Denies any chest pain, orthopnea, palpitations.  GASTROINTESTINAL: Denies nausea, vomiting, diarrhea, abdominal pain.  GENITOURINARY: No dysuria or hematuria.  ENDOCRINE: Denies polyuria, nocturia, thyroid problems. Has a chronic history of insulin-requiring diabetes mellitus. HEMATOLOGIC AND LYMPHATIC: No anemia, easy bruising or bleeding.  INTEGUMENTARY: No acne, rash, lesions.  MUSCULOSKELETAL: No joint pain in the neck and back. Denies any gout.  NEUROLOGIC: Denies vertigo, ataxia, dementia, TIA or CVA. PSYCHIATRIC: No ADD or OCD.   PHYSICAL EXAMINATION: VITAL SIGNS: Temperature 97.7, pulse 100, respirations 18, blood pressure 150/92, pulse oximetry is 96%.  GENERAL APPEARANCE: Not under acute distress. Moderately built and nourished.  HEENT: Normocephalic, atraumatic. Pupils are equally reactive to light and accommodation. No scleral icterus. No conjunctival injection. No sinus tenderness. No postnasal drip. Moist mucous membranes.  NECK: Supple. No JVD. No thyromegaly. Range of motion is intact.  LUNGS: Clear to auscultation  bilaterally. No accessory muscle usage. No anterior chest wall tenderness on  palpation.  CARDIAC: S1 and S2 normal. Regular rate and rhythm. No murmurs.  GASTROINTESTINAL: Soft. Bowel sounds are positive in all 4 quadrants. Nontender, nondistended. No hepatosplenomegaly. No masses. NEUROLOGICAL: Awake, alert and oriented x3. Cranial nerves II through XII are grossly intact. Motor and sensory are intact. Reflexes are 2+.  EXTREMITIES: The left knee surgery scar is healing well. He has minimal edema from that, but no erythema or calf tenderness. No cyanosis. No clubbing.  MUSCULOSKELETAL: No joint effusion, tenderness or erythema.  PSYCHIATRIC: Normal mood and affect.   LABORATORY AND IMAGING STUDIES: CT angiogram of the chest positive for acute PE with CT evidence for right heart strain, consistent with at least submassive, intermediate risk pulmonary embolism, presence of right heart strain has been associated with increased risk of morbidity and mortality. Cholelithiasis. CPK-MB 6.4. Troponin 0.79. WBC 11.3, hemoglobin, hematocrit and platelets are normal. PT, INR at 13.9 and 1.1, activated PTT is 29.3. Glucose 229, BUN 24, creatinine 1.32, sodium and potassium are normal, CO2 22, chloride 110, GFR greater than 60, serum osmolality 293, calcium 8.9. Current EKG: Normal sinus rhythm, incomplete right bundle branch block, left ventricular hypertrophy.   ASSESSMENT AND PLAN: A 69 year old male presenting to the ED with a chief complaint of dizziness associated with shortness of breath. Will be admitted with the following assessment and plan.   1. Near-syncope with chronic history of intermittent episodes of shortness of breath: for more than 3 months. This is most likely from acute right side submassive pulmonary embolism. Will admit him to telemetry. Heparin drip is initiated per protocol. Will start the patient on Coumadin. Will get a coagulation workup. Hematology/oncology consult is placed. Pulmonology consult is placed to Dr. Raul Del as requested by the family.  2. Elevated  troponin. This is probably from underlying acute pulmonary embolism. Recent cardiac catheterization in August was normal. Will just monitor troponin trend.  3. Acute kidney injury, probably prerenal. Will provide IV fluids.  4. Insulin-requiring diabetes mellitus. Resume home dose of Lantus at 50 units subcutaneous q.12 hours and provide insulin sliding scale. The patient is following up with endocrinology as an outpatient and also with diabetes clinic.  5. Hyperlipidemia. Continue statin, which is his home medication.  6. Will provide gastrointestinal prophylaxis.  7. Deep vein thrombosis prophylaxis with heparin drip.   CODE STATUS: The patient is full code. Wife is the medical power of attorney.   Diagnosis and plan of care were discussed in detail with the patient and his wife and sister at bedside. They all verbalized understanding of the plan.   TOTAL TIME SPENT: 50 minutes.  ____________________________ Nicholes Mango, MD ag:lb D: 10/27/2013 14:29:36 ET T: 10/27/2013 14:46:15 ET JOB#: [id number]  cc: Nicholes Mango, MD, <Dictator> Mikeal Hawthorne. Brynda Greathouse, MD Herbon E. Raul Del, MD Ahmed Prima, MD Nicholes Mango MD ELECTRONICALLY SIGNED 10/27/2013 15:41

## 2014-06-08 NOTE — Discharge Summary (Signed)
PATIENT NAME:  Dustin Estrada, Dustin Estrada MR#:  [id number] DATE OF BIRTH:  March 24, 1945  DATE OF ADMISSION:  10/27/2013 DATE OF DISCHARGE:  10/30/2013  For a detailed note, please take a look at the history and physical done on admission by Dr. Nicholes Mango, MD   DIAGNOSES AT DISCHARGE:  1.  Acute pulmonary embolism.  2.  Hypertension.  3.  Hyperlipidemia.  4.  Diabetes.    DIET: The patient is being discharged on a low-sodium, low-fat, carbohydrate-controlled diet.   ACTIVITY: As tolerated.   FOLLOWUP: Dr. Brynda Greathouse in the next 1-2 weeks. Also follow up with Dr. Delight Hoh in the next 2 weeks.   DISCHARGE MEDICATIONS: Protonix 20 mg daily, Simvastatin 20 mg daily, lisinopril 20 mg daily, Lantus 50 units b.i.d. Xarelto 15 mg b.i.d. x 20 days and then Xarelto 20 mg daily thereafter.   [place] COURSE: Katha Cabal, MD from vascular surgery, Javier Docker. Ubaldo Glassing, MD from cardiology, Kathlene November. Grayland Ormond, MD from hematology/oncology.  PERTINENT STUDIES DONE DURING HOSPITAL COURSE: A CT scan of the chest done with contrast showing positive for acute PE with CT evidence of right heart strain, consistently with at least submassive PE. Doppler of the lower extremities showing nonocclusive thrombus of the right mid popliteal vein and right proximal femoral vein.   HOSPITAL COURSE: This is a 69 year old male who presented to the hospital with a presyncopal episode and was noted to have an acute pulmonary embolism.  1.  Acute pulmonary embolism. This was likely the cause of the patient's worsening shortness of breath and presyncope. The patient's CT scan of the chest with contrast did confirm that. The   patient was noted to have a submassive; initially started on heparin and Coumadin. A vascular surgery consult and hematology/oncology consult were also obtained. The patient did undergo a thrombophilia workup, the results of which are still pending. The patient was seen by vascular surgery;  they did not think that the patient needed an IVC filter at this time, even though the patient had some mild clot burden on his leg. He was switched over from heparin and Coumadin to oral Xarelto, which he is currently tolerating well. He is being discharged on oral Xarelto with close followup with hematology/oncology as an outpatient, along with his primary care physician. He was ambulated and he did not desaturate, and did not qualify for any home oxygen.  2.  Elevated troponin. This was likely in a setting of demand ischemia from the acute pulmonary embolism. He had no acute chest pain. He did have an echocardiogram done, the results of which are still pending, and should be followed up.  3.  Diabetes. The patient's blood sugars remained stable. He will continue his Lantus and sliding scale insulin.  4.  Hypertension. The patient remained hemodynamically stable. He was continued on his metoprolol.  5.  Hyperlipidemia. The patient was maintained on his simvastatin; he would also resume it upon discharge.   CODE STATUS: The patient is a Full Code.   TIME SPENT ON DISCHARGE: 40 minutes    ____________________________ Belia Heman. Verdell Carmine, MD vjs:MT D: 10/30/2013 14:53:05 ET T: 10/30/2013 15:05:40 ET JOB#: [id number]  cc: Belia Heman. Verdell Carmine, MD, <Dictator> Mikeal Hawthorne. Brynda Greathouse, MD Kathlene November. Grayland Ormond, MD  Henreitta Leber MD ELECTRONICALLY SIGNED 11/05/2013 9:42

## 2014-06-08 NOTE — Consult Note (Signed)
   Present Illness 69 yo male with history of recent diagnosis of pulmonary emboli wiht dvt. He is currently being treated wiht heparin and warfarin. He was evaluated by Dr. Clayborn Bigness approximately one month ago with a cardiac cath which revealed normal coronary arteries and normal ef. He was diagnosed with bilateral  pulmonary embolus with chest ct. He was admitted and placed on iv hepairn and is being loaded wiht warfarin. Today was noted ot have transeint LOC during a coughing episode witness by his son. Telemetry during this event revealed sr in the 60's . He recovered after af few seconds. He is currently resting comfortabley and is hemodynamically stable with normal telemtry. Etiology of PE is unclear. He denies any prolonged air or road trips, recent lower extremety trauma of surgeries. He is very acitve. Hypercoagulable workup is underway.   Physical Exam:  GEN no acute distress   HEENT PERRL   NECK No masses   RESP normal resp effort  clear BS   CARD Regular rate and rhythm  Normal, S1, S2   ABD denies tenderness  normal BS   LYMPH negative neck   EXTR negative cyanosis/clubbing, negative edema   SKIN normal to palpation   NEURO cranial nerves intact, motor/sensory function intact   PSYCH A+O to time, place, person   Review of Systems:  Subjective/Chief Complaint shortness of breath with exertion and ocadional coughing   General: Fatigue   Skin: No Complaints   ENT: No Complaints   Eyes: No Complaints   Neck: No Complaints   Respiratory: Frequent cough  Short of breath   Cardiovascular: Dyspnea   Gastrointestinal: No Complaints   Genitourinary: No Complaints   Vascular: No Complaints   Musculoskeletal: No Complaints   Neurologic: No Complaints   Hematologic: No Complaints   Endocrine: No Complaints   Psychiatric: No Complaints   Review of Systems: All other systems were reviewed and found to be negative   Medications/Allergies Reviewed  Medications/Allergies reviewed   Family & Social History:  Family and Social History:  Family History Non-Contributory   Place of Living Home   EKG:  EKG NSR    No Known Allergies:    Impression 69 yo male who is admitted after being diagnosed wiht bilateral pulmonary emboli. He is currently on heparin and being loaded with warfarin. He had a coughing episode today during which he had transient loss of ocnsciousness. His heart rate was greater than 60 during the event and he is currently stable and without sympotms. He had a normal cardiac cath approxmately one month ago. He is currently stable. He has diabetes mellitus. He is being worked up for [employer] state due to lack of other obvious etiology for his dvt/pe. Etiology of the syncopal event is likely cough syncope vs vasovagal. Does not appear to have had a primary arrythma event.   Plan 1. Continue to treat with hepairn/warfarin 2. FOllow on tlemetry 3. Hydration 4. Conitnue hypercoagulable workup 5. Will follow with you   Electronic Signatures: Teodoro Spray (MD)  (Signed 13-Sep-15 16:25)  Authored: General Aspect/Present Illness, History and Physical Exam, Review of System, Family & Social History, EKG , Allergies, Impression/Plan   Last Updated: 13-Sep-15 16:25 by Teodoro Spray (MD)

## 2014-06-08 NOTE — Consult Note (Signed)
Patient with bilateral "submassive" PE with evidence of right heart strain. Full hypercoagulable workup has been initiated. Agree with anticoagulation. Consider vascular consult for embolectomy if no contraindications. Full consult to follow. consult.  Electronic Signatures: Delight Hoh (MD)  (Signed on 12-Sep-15 15:00)  Authored  Last Updated: 12-Sep-15 15:00 by Delight Hoh (MD)

## 2014-06-08 NOTE — Consult Note (Signed)
Brief Consult Note: Diagnosis: bilateral PEright leg DVT.   Patient was seen by consultant.   Discussed with Attending MD.   Comments: The patient has not been on anticoagulation prior to this admission.  Thus far he is tollerating heparin and therefore an IVC filter is not needed urgently  He does have profound pulmonary compromise (he states he becomes short of breath walking across a room) and the CT did show evidence of right heart strain.  Therefore a  filter is still a stong consideration.  I have discussed this with the family and will talk to pulmonary before making a final decision.  Electronic Signatures: Hortencia Pilar (MD)  (Signed 13-Sep-15 23:02)  Authored: Brief Consult Note   Last Updated: 13-Sep-15 23:02 by Hortencia Pilar (MD)

## 2014-06-08 NOTE — Consult Note (Signed)
Note Type Consult   HPI: This 69 year old Male patient presents to the clinic for initial evaluation of  sub-massive bilateral PE, D sub-massive bilateral PE, DVT.  Subjective: Chief Complaint/Diagnosis:   Sub-massive bilateral PE, DVT. HPI:   Patient is a 69 year old male who recently became increasing shortness of breath and had a syncopal episode. Social workup, bilateral submassive pulmonary embolism. His family reports that he had been in and out of the hospital over the past several weeks with problems with diabetes and dehydration. Patient had another vagovagal episode earlier today, currently feels well and is back to baseline. He has no other neurologic complaints. He is good appetite and denies weight loss. He denies any fevers. He denies any chest pain. He denies any nausea, vomiting, constipation, or diarrhea. He has no urinary complaints. Patient otherwise feels well and offers no further specific complaints.   Review of Systems:  Performance Status (ECOG): 0  Pain ?: No complaints (0, none)  Emotional well-being: None  Review of Systems:   As per HPI. Otherwise, 10 point system review was negative.   Allergies:  No Known Allergies:   PFSH: Additional Past Medical and Surgical History: diabetes, hyperlipidemia, EGD, hip and knee surgery.    Family history: Lymphoma, diabetes.     Social history: Distant tobacco use approximately 50 years. No report of alcohol use.   Home Medications: Medication Instructions Last Modified Date/Time  pantoprazole 20 mg oral delayed release tablet 1 tab(s) orally once a day. 12-Sep-15 10:40  simvastatin 20 mg oral tablet 1 tab(s) orally once a day 12-Sep-15 10:40  lisinopril 20 mg oral tablet 1 tab(s) orally once a day 12-Sep-15 10:40  ibuprofen 1 tab(s) orally 4 times a day, As Needed 12-Sep-15 10:40  Lantus Solostar Pen 100 units/mL subcutaneous solution 50 unit(s) subcutaneous 2 times a day 12-Sep-15 10:40  insulin glargine   subcutaneous  slidding scale 12-Sep-15 10:40   Vital Signs:  :: vital signs stable, patient afebrile.   Physical Exam:  General: well developed, well nourished, and in no acute distress  Mental Status: normal affect  Eyes: anicteric sclera  Head, Ears, Nose,Throat: Normocephalic, moist mucous membranes, clear oropharynx without erythema or thrush.  Neck, Thyroid: No palpable lymphadenopathy, thyroid midline without nodules.  Respiratory: clear to auscultation bilaterally  Cardiovascular: regular rate and rhythm, no murmur, rub, or gallop  Gastrointestinal: soft, nondistended, nontender, no organomegaly.  normal active bowel sounds  Musculoskeletal: No edema  Skin: No rash or petechiae noted  Neurological: alert, answering all questions appropriately.  Cranial nerves grossly intact   Lab Results Review:  Lab Results   BMP  :  28-Oct-2013 04:08:  Last Resulted Date/Time.139 K+: 4.1 CO2: 24 Chl: 110(H) BUN: 20(H) Cre: 1.23 Glu:224(H) Calcium: 8.7 .  Time  :  28-Oct-2013 04:08:  Last Resulted Date/Time.14.0 INR: 1.1 .   Medical Imaging Results:   Review Medical Imaging   CT ANGIOGRAPHY Chest with for PE 27-Oct-2013 12:18:00: IMPRESSION:  1. Positive for acute PE with CT evidence of right heart strain  (RV/LV Ratio = 1.57 ) consistent with at least submassive  (intermediate risk) PE. The presence of right heart strain has been  associated with an increased risk of morbidity and mortality.  Critical Value/emergent results were called by telephone at the time  of interpretation on 10/27/2013 at 12:50 pm to Dr. Francene Castle ,  who verbally acknowledged these results.  2. Cholelithiasis.      Electronically Signed    By: Quillian Quince  Vernard Gambles M.D.    On: 10/27/2013 12:51         Verified By: Kandis Cocking, M.D., Color Flow Doppler Low Extrem Bilat (Legs) 27-Oct-2013 16:30:00: IMPRESSION:  1. Nonocclusive thrombus in the right mid popliteal vein and right  proximal femoral vein.  2. No evidence of  deep venous thrombosis in the left lower  extremity.      Electronically Signed    By: Vinnie Langton M.D.    On: 10/27/2013 17:24         Verified By: Etheleen Mayhew, M.D.,  Assessment and Plan: Impression:   Sub-massive bilateral PE, DVT. Plan:   1. PE/DVT: Agree with anticoagulation. Hypercoagulable workup has been ordered and is currently pending. Patient will require a minimum of 6 months of anticoagulation. Plan is to discharge on Xarelto.  Appreciate vascular surgery input. No indication for embolectomy or IVC filter at this time.  Patient will followup in the [place] in approximately 2 weeks to discuss the results hypercoagulable workup. Underlying malignancy is also possibility and tumor markers have been ordered for completeness. Patient's family expressed understanding and were in agreement with this plan. consult, call with questions.  Electronic Signatures: Delight Hoh (MD)  (Signed 13-Sep-15 14:00)  Authored: Note Type, History of Present Illness, CC/HPI, Review of Systems, ALLERGIES, Patient Family Social History, HOME MEDICATIONS, Vital Signs, Physical Exam, Lab Results Review, Rad Results Review, Assessment and Plan   Last Updated: 13-Sep-15 14:00 by Delight Hoh (MD)

## 2014-06-08 NOTE — Consult Note (Signed)
Present Illness The patient is a 69 year old male with past medical history of insulin-requiring diabetes mellitus, on Lantus, who has been experiencing intermittent episodes of shortness of breath and chest pain for the past several months. Over time it has been getting worse.  The patient was seen by Dr. Clayborn Bigness and had cardiac catheterization done on August 6th, which has revealed no blockages. The patient was also seen by his pulmonologist, Dr. Raul Del, as an outpatient. yesterday, the patient was feeling lightheaded and short of breath and came into the ED. CT angiogram of the chest was done which has revealed right submassive pulmonary embolism with right-sided strain.  Duplex ultrasound shows partially occlusive thrombus in the right popliteal vein  PAST MEDICAL HISTORY:  1. Insulin-requiring diabetes mellitus.  2. Hyperlipidemia.  PAST SURGICAL HISTORY: 1. Penile implant for erectile dysfunction in August 2014.  2. Hip and knee surgery.  3. Recent history of cardiac catheterization in August, which was normal.   Home Medications: Medication Instructions Status  lisinopril 20 mg oral tablet 1 tab(s) orally once a day Active  ibuprofen 1 tab(s) orally 4 times a day, As Needed Active  Lantus Solostar Pen 100 units/mL subcutaneous solution 50 unit(s) subcutaneous 2 times a day Active  insulin glargine  subcutaneous  slidding scale Active  simvastatin 20 mg oral tablet 1 tab(s) orally once a day Active  pantoprazole 20 mg oral delayed release tablet 1 tab(s) orally once a day. Active    No Known Allergies:   Case History:  Family History Non-Contributory   Social History negative tobacco, negative ETOH, negative Illicit drugs   Review of Systems:  Fever/Chills No   Cough No   Sputum No   Abdominal Pain No   Diarrhea No   Constipation No   Nausea/Vomiting No   SOB/DOE Yes  with walking   Chest Pain Yes   Telemetry Reviewed NSR   Dysuria No   Physical  Exam:  GEN well developed, well nourished, obese   HEENT hearing intact to voice, moist oral mucosa   NECK supple  trachea midline   RESP normal resp effort  no use of accessory muscles   CARD regular rate  no JVD   ABD denies tenderness  soft   EXTR negative cyanosis/clubbing, negative edema   SKIN No rashes, No ulcers   NEURO cranial nerves intact, follows commands, motor/sensory function intact   PSYCH alert, A+O to time, place, person   Nursing/Ancillary Notes: **Vital Signs.:   13-Sep-15 08:33  Vital Signs Type Routine  Temperature Temperature (F) 98.6  Celsius 37  Pulse Pulse 85  Respirations Respirations 16  Systolic BP Systolic BP 431  Diastolic BP (mmHg) Diastolic BP (mmHg) 86  Mean BP 101  Pulse Ox % Pulse Ox % 94  Pulse Ox Activity Level  At rest  Oxygen Delivery Room Air/ 21 %  Nurse Fingerstick (mg/dL) FSBS (fasting range 65-99 mg/dL) 182   Thyroid:  13-Sep-15 04:08   Thyroid Stimulating Hormone 2.55 (0.45-4.50 (IU = International Unit)  ----------------------- Pregnant patients have  different reference  ranges for TSH:  - - - - - - - - - -  Pregnant, first trimetser:  0.36 - 2.50 uIU/mL)  LabObservation:  12-Sep-15 16:30   OBSERVATION Reason for Test Positive Known Pulmonary Embolus  Cardiology:  12-Sep-15 10:35   Ventricular Rate 100  Atrial Rate 100  P-R Interval 136  QRS Duration 92  QT 392  QTc 505  P Axis 35  R Axis -  50  T Axis 44  ECG interpretation Normal sinus rhythm Incomplete right bundle branch block Left ventricular hypertrophy with repolarization abnormality Prolonged QT Abnormal ECG When compared with ECG of 21-Oct-2012 11:49, Incomplete right bundle branch block is now Present STnow depressed in Anterior leads Nonspecific T wave abnormality now evident in Anterior leads QT has lengthened ----------unconfirmed---------- Confirmed by OVERREAD, NOT ([id number]), editor PEARSON, BARBARA ([id number]) on 10/29/2013 9:06:17 AM  Routine  Chem:  12-Sep-15 10:38   Glucose, Serum  229  BUN  24  Creatinine (comp)  1.32  Sodium, Serum 141  Potassium, Serum 4.1  Chloride, Serum  110  CO2, Serum 22  Calcium (Total), Serum 8.9  Anion Gap 9  Osmolality (calc) 293  eGFR (African American) >60  eGFR (Non-African American)  56 (eGFR values <64mL/min/1.73 m2 may be an indication of chronic kidney disease (CKD). Calculated eGFR is useful in patients with stable renal function. The eGFR calculation will not be reliable in acutely ill patients when serum creatinine is changing rapidly. It is not useful in  patients on dialysis. The eGFR calculation may not be applicable to patients at the low and high extremes of body sizes, pregnant women, and vegetarians.)  Result Comment TROPONIN - RESULTS VERIFIED BY REPEAT TESTING.  - RESULTS CALLED TO VALERIE CHANDLER AT  - 1122 ON 10/27/13.[place] KitchenMarland KitchenRoslyn Heights  - READ-BACK PROCESS PERFORMED.  Result(s) reported on 27 Oct 2013 at 11:04AM.    14:30   Result Comment TROPONIN - RESULTS VERIFIED BY REPEAT TESTING.  - PREVIOUSLY CALLED AT 1122 10/27/13 BY Stephenson  Result(s) reported on 27 Oct 2013 at 03:40PM.    18:37   Result Comment TROPONIN - RESULTS VERIFIED BY REPEAT TESTING.  - ELEVATED TROPONIN PREVIOUSLY CALLED AT  - 1122 10/27/13.PMH  Result(s) reported on 27 Oct 2013 at 07:23PM.  13-Sep-15 04:08   Glucose, Serum  224  BUN  20  Creatinine (comp) 1.23  Sodium, Serum 139  Potassium, Serum 4.1  Chloride, Serum  110  CO2, Serum 24  Calcium (Total), Serum 8.7  Anion Gap  5  Osmolality (calc) 287  eGFR (African American) >60  eGFR (Non-African American) >60 (eGFR values <59mL/min/1.73 m2 may be an indication of chronic kidney disease (CKD). Calculated eGFR is useful in patients with stable renal function. The eGFR calculation will not be reliable in acutely ill patients when serum creatinine is changing rapidly. It is not useful in  patients on dialysis. The eGFR calculation may not be  applicable to patients at the low and high extremes of body sizes, pregnant women, and vegetarians.)  Hemoglobin A1c ([facility])  7.8 (The American Diabetes Association recommends that a primary goal of therapy should be <7% and that physicians should reevaluate the treatment regimen in patients with HbA1c values consistently >8%.)  Cholesterol, Serum 153  Triglycerides, Serum  213  HDL (INHOUSE)  31  VLDL Cholesterol Calculated  43  LDL Cholesterol Calculated 79 (Result(s) reported on 28 Oct 2013 at 04:52AM.)  Cardiac:  12-Sep-15 10:38   Troponin I  0.79 (0.00-0.05 0.05 ng/mL or less: NEGATIVE  Repeat testing in 3-6 hrs  if clinically indicated. >0.05 ng/mL: POTENTIAL  MYOCARDIAL INJURY. Repeat  testing in 3-6 hrs if  clinically indicated. NOTE: An increase or decrease  of 30% or more on serial  testing suggests a  clinically important change)  CPK-MB, Serum  6.4 (Result(s) reported on 27 Oct 2013 at 11:52AM.)    14:30   Troponin I  0.70 (0.00-0.05 0.05 ng/mL or less:  NEGATIVE  Repeat testing in 3-6 hrs  if clinically indicated. >0.05 ng/mL: POTENTIAL  MYOCARDIAL INJURY. Repeat  testing in 3-6 hrs if  clinically indicated. NOTE: An increase or decrease  of 30% or more on serial  testing suggests a  clinically important change)  CPK-MB, Serum  6.0 (Result(s) reported on 27 Oct 2013 at 03:38PM.)    18:37   Troponin I  0.90 (0.00-0.05 0.05 ng/mL or less: NEGATIVE  Repeat testing in 3-6 hrs  if clinically indicated. >0.05 ng/mL: POTENTIAL  MYOCARDIAL INJURY. Repeat  testing in 3-6 hrs if  clinically indicated. NOTE: An increase or decrease  of 30% or more on serial  testing suggests a  clinically important change)  CPK-MB, Serum  6.0 (Result(s) reported on 27 Oct 2013 at 07:21PM.)  Routine UA:  13-Sep-15 08:16   Color (UA) Yellow  Clarity (UA) Clear  Glucose (UA) 150 mg/dL  Bilirubin (UA) Negative  Ketones (UA) Negative  Specific Gravity (UA) 1.020  Blood (UA)  Negative  pH (UA) 5.0  Protein (UA) Negative  Nitrite (UA) Negative  Leukocyte Esterase (UA) Negative (Result(s) reported on 28 Oct 2013 at 08:31AM.)  RBC (UA) <1 /HPF  WBC (UA) 1 /HPF  Bacteria (UA) NONE SEEN  Epithelial Cells (UA) NONE SEEN  Mucous (UA) PRESENT (Result(s) reported on 28 Oct 2013 at 08:31AM.)  Routine Coag:  12-Sep-15 10:38   Prothrombin 13.9  INR 1.1 (INR reference interval applies to patients on anticoagulant therapy. A single INR therapeutic range for coumarins is not optimal for all indications; however, the suggested range for most indications is 2.0 - 3.0. Exceptions to the INR Reference Range may include: Prosthetic heart valves, acute myocardial infarction, prevention of myocardial infarction, and combinations of aspirin and anticoagulant. The need for a higher or lower target INR must be assessed individually. Reference: The Pharmacology and Management of the Vitamin K  antagonists: the seventh ACCP Conference on Antithrombotic and Thrombolytic Therapy. [id number].5400 Sept:126 (3suppl): N9146842. A HCT value >55% may artifactually increase the PT.  In one study,  the increase was an average of 25%. Reference:  "Effect on Routine and Special Coagulation Testing Values of Citrate Anticoagulant Adjustment in Patients with High HCT Values." American Journal of Clinical Pathology 2006;126:400-405.)  Activated PTT (APTT) 29.3 (A HCT value >55% may artifactually increase the APTT. In one study, the increase was an average of 19%. Reference: "Effect on Routine and Special Coagulation Testing Values of Citrate Anticoagulant Adjustment in Patients with High HCT Values." American Journal of Clinical Pathology 2006;126:400-405.)  13-Sep-15 04:08   Prothrombin 14.0  INR 1.1 (INR reference interval applies to patients on anticoagulant therapy. A single INR therapeutic range for coumarins is not optimal for all indications; however, the suggested range for most  indications is 2.0 - 3.0. Exceptions to the INR Reference Range may include: Prosthetic heart valves, acute myocardial infarction, prevention of myocardial infarction, and combinations of aspirin and anticoagulant. The need for a higher or lower target INR must be assessed individually. Reference: The Pharmacology and Management of the Vitamin K  antagonists: the seventh ACCP Conference on Antithrombotic and Thrombolytic Therapy. QQPYP.9509 Sept:126 (3suppl): N9146842. A HCT value >55% may artifactually increase the PT.  In one study,  the increase was an average of 25%. Reference:  "Effect on Routine and Special Coagulation Testing Values of Citrate Anticoagulant Adjustment in Patients with High HCT Values." American Journal of Clinical Pathology 2006;126:400-405.)  Routine Hem:  12-Sep-15 10:38   WBC (CBC)  11.3  RBC (  CBC) 4.79  Hemoglobin (CBC) 14.8  Hematocrit (CBC) 45.3  Platelet Count (CBC) 160  MCV 94  MCH 30.8  MCHC 32.6  RDW 13.8  Neutrophil % 80.6  Lymphocyte % 11.0  Monocyte % 6.6  Eosinophil % 1.0  Basophil % 0.8  Neutrophil #  9.1  Lymphocyte # 1.2  Monocyte # 0.8  Eosinophil # 0.1  Basophil # 0.1 (Result(s) reported on 27 Oct 2013 at 10:56AM.)  13-Sep-15 04:08   WBC (CBC) 9.5  RBC (CBC) 4.63  Hemoglobin (CBC) 14.4  Hematocrit (CBC) 43.4  Platelet Count (CBC) 152  MCV 94  MCH 31.2  MCHC 33.2  RDW 13.5  Neutrophil % 74.4  Lymphocyte % 15.4  Monocyte % 7.2  Eosinophil % 2.4  Basophil % 0.6  Neutrophil #  7.0  Lymphocyte # 1.5  Monocyte # 0.7  Eosinophil # 0.2  Basophil # 0.1 (Result(s) reported on 28 Oct 2013 at 04:52AM.)   [place]:    12-Sep-15 16:30, [place] Color Flow Doppler Low Extrem Bilat (Legs)  [place] Color Flow Doppler Low Extrem Bilat (Legs)   REASON FOR EXAM:    Positive Known Pulmonary Embolus  COMMENTS:       PROCEDURE: [place]  - US DOPPLER LOW EXTR BILATERAL  - Oct 27 2013  4:30PM     CLINICAL DATA:  Known pulmonary  embolism.    EXAM:  BILATERAL LOWER EXTREMITY VENOUS DOPPLER ULTRASOUND    TECHNIQUE:  Gray-scale sonography with graded compression, as well as color  Doppler and duplex ultrasound were performed to evaluate the lower  extremity deep venous systems from the level of the common femoral  vein and including the common femoral, femoral, profunda femoral,  popliteal and calf veins including the posterior tibial, peroneal  and gastrocnemius veins when visible. The superficial great  saphenous vein was also interrogated. Spectral Doppler was utilized  to evaluate flow at restand with distal augmentation maneuvers in  the common femoral, femoral and popliteal veins.    COMPARISON:  No priors.    FINDINGS:  RIGHT LOWER EXTREMITY    Common Femoral Vein: No evidence of thrombus. Normal  compressibility, respiratory phasicityand response to augmentation.    Saphenofemoral Junction: No evidence of thrombus. Normal  compressibility and flow on color Doppler imaging.    Profunda Femoral Vein: No evidence of thrombus. Normal  compressibility and flow on color Doppler imaging.    Femoral Vein: Nonocclusive thrombus.  Incomplete compressibility.    Popliteal Vein: Nonocclusive thrombus.  Incomplete compressibility.    Calf Veins: No evidence of thrombus. Normal compressibility and flow  on color Doppler imaging.    Superficial Great Saphenous Vein: No evidence of thrombus. Normal  compressibility and flow on color Doppler imaging.  Venous Reflux:  None.    Other Findings:  None.    LEFT LOWER EXTREMITY    Common Femoral Vein: No evidence of thrombus. Normal  compressibility, respiratory phasicity and response to augmentation.    Saphenofemoral Junction: No evidence of thrombus. Normal  compressibility and flow on color Doppler imaging.    Profunda Femoral Vein: No evidence of thrombus. Normal  compressibility and flow on color Doppler imaging.  Femoral Vein: No evidence of  thrombus. Normal compressibility,  respiratory phasicity and response to augmentation.    Popliteal Vein: No evidence of thrombus. Normal compressibility,  respiratory phasicity and response to augmentation.    Calf Veins: No evidence of thrombus. Normal compressibility and flow  on color Doppler imaging.    Superficial Great  Saphenous Vein: No evidence of thrombus. Normal  compressibility and flow on color Doppler imaging.    Other Findings:  None.     IMPRESSION:  1. Nonocclusive thrombus in the right mid popliteal vein and right  proximal femoral vein.  2. No evidence of deep venous thrombosis in the left lower  extremity.      Electronically Signed    By: Vinnie Langton M.D.    On: 10/27/2013 17:24         Verified By: Etheleen Mayhew, M.D.,    Impression 1. PE associated with DVT.  The patient has been started on heparin and will need long term anticoagulation.  whether he needs an IVC filter will depend on how well he does on anticoagulation.   2. Elevated troponin. This is probably from underlying acute pulmonary embolism. Recent cardiac catheterization in August was normal. Will just monitor troponin trend.  3. Acute kidney injury, probably prerenal. Will provide IV fluids.  4. Insulin-requiring diabetes mellitus. Resume home dose of Lantus at 50 units subcutaneous q.12 hours and provide insulin sliding scale. The patient is following up with endocrinology as an outpatient and also with diabetes clinic.  5. Hyperlipidemia. Continue statin, which is his home medication.   Electronic Signatures: Hortencia Pilar (MD)  (Signed 14-Sep-15 21:05)  Authored: General Aspect/Present Illness, Home Medications, Allergies, History and Physical Exam, Vital Signs, Labs, Radiology, Impression/Plan   Last Updated: 14-Sep-15 21:05 by Hortencia Pilar (MD)

## 2014-06-16 NOTE — Op Note (Signed)
PATIENT NAME:  Dustin Estrada, Dustin Estrada MR#:  [id number] DATE OF BIRTH:  [birth year]  DATE OF PROCEDURE:  03/06/2014  PREOPERATIVE DIAGNOSES:  1.  Right rotator cuff tear.  2.  Arthritis right acromioclavicular joint.  3.  Severe impingement right shoulder subacromial space.   POSTOPERATIVE DIAGNOSES:  1.  Right rotator cuff tear.  2.  Arthritis right acromioclavicular joint.  3.  Severe impingement right shoulder subacromial space.   OPERATIONS:  1.  Arthroscopic right rotator cuff repair.  2.  Arthroscopic right distal clavicle excision.  3.  Arthroscopic subacromial decompression right shoulder.   SURGEON: Park Breed, M.D.   ANESTHESIA: General endotracheal plus interscalene block.   COMPLICATIONS: None.   ESTIMATED BLOOD LOSS: Minimal.   REPLACEMENT: None.   OPERATIVE PROCEDURE: The patient was brought to the operating room where he underwent satisfactory general endotracheal anesthesia. Interscalene block was not able to be done preoperatively. This was done after the procedure was completed when the patient was asleep. The patient was turned in the left lateral decubitus position and padded appropriately on the beanbag. The right shoulder was prepped and draped in sterile fashion and placed in 12 pounds of traction. Arthroscopy was carried out from posterior, anterior, and lateral portals. The glenohumeral joint showed mild fraying of the labrum. The biceps tendon was intact. The articular surfaces were good. The undersurface of the cuff showed a thin circular area anteriorly. A PDS suture was passed through the spinal needle through this area of the cuff and into the joint. After debriding the frayed soft tissues in the joint itself the arthroscope was redirected into the subacromial space where there was extensive bursitis. This was removed using the motorized resector and the ArthroCare wand. The tendon in the area in question where the PDS suture passed was seen to be severely frayed  and torn on the bursal. The PDS suture was removed. The remaining few fibers of tendon were debrided leave leaving a 1 cm defect in the cuff. The soft tissues were removed from the tuberosity and a burr used to freshen this up gently. The acromioplasty was carried out from a posterior approach using a large burr and the distal clavicle was completely resected from anterior portal for about 10 mm width. Once this was all accomplished a Magnum wire was passed through the rotator cuff using a Perfect Passer. The suture was brought out through the front and then a 5.5 mm tap was introduced in the lateral aspect of the tuberosity and advanced up. This was removed and the sutures were brought out through the lateral portal. The 5.5 mm Speedscrew was then inserted and the Magnum wire sutures were threaded through this device. These were then tightened down and traction was reduced to 5 pounds. The Magnum wire and tendon were advanced laterally as far as possible using the Speedscrew device, which was then removed and the suture was cut. The defect was completely covered. The final debridement was carried out and irrigation was carried out and the stab wounds were closed with 3-0 nylon suture. Dry sterile dressing with TENS pads were applied. Padded sling was applied. Following this the patient was placed in the supine position on the operating table and an interscalene block was carried out. He was then awakened and taken to recovery in good condition.     ____________________________ Park Breed, MD hem:bu D: 03/06/2014 13:23:57 ET T: 03/06/2014 14:37:47 ET JOB#: [id number]  cc: Park Breed, MD, <Dictator> Park Breed MD ELECTRONICALLY  SIGNED 03/08/2014 11:49

## 2014-12-17 DIAGNOSIS — Z86718 Personal history of other venous thrombosis and embolism: Secondary | ICD-10-CM

## 2014-12-17 HISTORY — DX: Personal history of other venous thrombosis and embolism: Z86.718

## 2015-01-15 DIAGNOSIS — Z86711 Personal history of pulmonary embolism: Secondary | ICD-10-CM | POA: Insufficient documentation

## 2015-01-20 ENCOUNTER — Other Ambulatory Visit: Payer: Self-pay | Admitting: Bariatrics

## 2015-01-23 ENCOUNTER — Ambulatory Visit
Admission: RE | Admit: 2015-01-23 | Discharge: 2015-01-23 | Disposition: A | Discharge status: Home / Self Care | Payer: Medicare Other | Source: Ambulatory Visit | Attending: Bariatrics | Admitting: Bariatrics

## 2015-01-23 DIAGNOSIS — M47814 Spondylosis without myelopathy or radiculopathy, thoracic region: Secondary | ICD-10-CM | POA: Diagnosis not present

## 2015-01-23 DIAGNOSIS — K760 Fatty (change of) liver, not elsewhere classified: Secondary | ICD-10-CM | POA: Diagnosis not present

## 2015-01-23 DIAGNOSIS — K8020 Calculus of gallbladder without cholecystitis without obstruction: Secondary | ICD-10-CM | POA: Insufficient documentation

## 2015-06-25 DIAGNOSIS — G4733 Obstructive sleep apnea (adult) (pediatric): Secondary | ICD-10-CM | POA: Insufficient documentation

## 2015-07-21 ENCOUNTER — Other Ambulatory Visit: Payer: Self-pay

## 2015-07-21 DIAGNOSIS — K219 Gastro-esophageal reflux disease without esophagitis: Secondary | ICD-10-CM | POA: Insufficient documentation

## 2015-07-21 DIAGNOSIS — E1165 Type 2 diabetes mellitus with hyperglycemia: Secondary | ICD-10-CM | POA: Insufficient documentation

## 2015-07-21 DIAGNOSIS — M1990 Unspecified osteoarthritis, unspecified site: Secondary | ICD-10-CM | POA: Insufficient documentation

## 2015-07-21 DIAGNOSIS — E119 Type 2 diabetes mellitus without complications: Secondary | ICD-10-CM | POA: Insufficient documentation

## 2015-07-23 ENCOUNTER — Encounter: Payer: Self-pay | Admitting: *Deleted

## 2015-07-23 ENCOUNTER — Ambulatory Visit: Payer: Medicare Other | Admitting: Anesthesiology

## 2015-07-23 ENCOUNTER — Encounter
Admission: RE | Disposition: A | Discharge status: Home / Self Care | Payer: Self-pay | Source: Ambulatory Visit | Attending: Gastroenterology

## 2015-07-23 ENCOUNTER — Ambulatory Visit
Admission: RE | Admit: 2015-07-23 | Discharge: 2015-07-23 | Disposition: A | Discharge status: Home / Self Care | Payer: Medicare Other | Source: Ambulatory Visit | Attending: Gastroenterology | Admitting: Gastroenterology

## 2015-07-23 DIAGNOSIS — M1990 Unspecified osteoarthritis, unspecified site: Secondary | ICD-10-CM | POA: Insufficient documentation

## 2015-07-23 DIAGNOSIS — Z01818 Encounter for other preprocedural examination: Secondary | ICD-10-CM | POA: Insufficient documentation

## 2015-07-23 DIAGNOSIS — E119 Type 2 diabetes mellitus without complications: Secondary | ICD-10-CM | POA: Insufficient documentation

## 2015-07-23 DIAGNOSIS — Z794 Long term (current) use of insulin: Secondary | ICD-10-CM | POA: Insufficient documentation

## 2015-07-23 DIAGNOSIS — R0602 Shortness of breath: Secondary | ICD-10-CM | POA: Insufficient documentation

## 2015-07-23 DIAGNOSIS — K449 Diaphragmatic hernia without obstruction or gangrene: Secondary | ICD-10-CM | POA: Insufficient documentation

## 2015-07-23 DIAGNOSIS — K2950 Unspecified chronic gastritis without bleeding: Secondary | ICD-10-CM | POA: Diagnosis not present

## 2015-07-23 DIAGNOSIS — K2970 Gastritis, unspecified, without bleeding: Secondary | ICD-10-CM | POA: Diagnosis not present

## 2015-07-23 DIAGNOSIS — K219 Gastro-esophageal reflux disease without esophagitis: Secondary | ICD-10-CM | POA: Diagnosis not present

## 2015-07-23 DIAGNOSIS — Z79899 Other long term (current) drug therapy: Secondary | ICD-10-CM | POA: Diagnosis not present

## 2015-07-23 HISTORY — PX: ESOPHAGOGASTRODUODENOSCOPY (EGD) WITH PROPOFOL: SHX5813

## 2015-07-23 LAB — GLUCOSE, CAPILLARY: Glucose-Capillary: 133 mg/dL — ABNORMAL HIGH (ref 65–99)

## 2015-07-23 SURGERY — ESOPHAGOGASTRODUODENOSCOPY (EGD) WITH PROPOFOL
Anesthesia: General

## 2015-07-23 MED ORDER — GLYCOPYRROLATE 0.2 MG/ML IJ SOLN
INTRAMUSCULAR | Status: DC | PRN
  Administered 2015-07-23: 0.2 mg via INTRAVENOUS

## 2015-07-23 MED ORDER — LIDOCAINE HCL (CARDIAC) 20 MG/ML IV SOLN
INTRAVENOUS | Status: DC | PRN
  Administered 2015-07-23: 100 mg via INTRAVENOUS

## 2015-07-23 MED ORDER — SODIUM CHLORIDE 0.9 % IV SOLN
INTRAVENOUS | Status: DC
  Administered 2015-07-23: 13:00:00 via INTRAVENOUS

## 2015-07-23 MED ORDER — PROPOFOL 10 MG/ML IV BOLUS
INTRAVENOUS | Status: DC | PRN
  Administered 2015-07-23: 120 mg via INTRAVENOUS

## 2015-07-23 MED ORDER — FENTANYL CITRATE (PF) 100 MCG/2ML IJ SOLN
INTRAMUSCULAR | Status: DC | PRN
  Administered 2015-07-23: 50 ug via INTRAVENOUS

## 2015-07-23 NOTE — Anesthesia Preprocedure Evaluation (Signed)
Anesthesia Evaluation  Patient identified by MRN, date of birth, ID band Patient awake    Reviewed: Allergy & Precautions, H&P , NPO status , Patient's Chart, lab work & pertinent test results, reviewed documented beta blocker date and time   Airway Mallampati: IV  TM Distance: >3 FB Neck ROM: full    Dental no notable dental hx. (+) Implants, Missing, Chipped, Poor Dentition   Pulmonary shortness of breath and with exertion, sleep apnea and Continuous Positive Airway Pressure Ventilation , neg COPD, neg recent URI,    Pulmonary exam normal breath sounds clear to auscultation       Cardiovascular Exercise Tolerance: Poor hypertension, (-) angina+ DOE  (-) CAD, (-) Past MI, (-) Cardiac Stents and (-) CABG Normal cardiovascular exam(-) dysrhythmias (-) Valvular Problems/Murmurs Rhythm:regular Rate:Normal     Neuro/Psych negative neurological ROS  negative psych ROS   GI/Hepatic Neg liver ROS, GERD  ,  Endo/Other  diabetes, Insulin DependentMorbid obesity  Renal/GU negative Renal ROS  negative genitourinary   Musculoskeletal   Abdominal   Peds  Hematology negative hematology ROS (+)   Anesthesia Other Findings Past Medical History:   Diabetes mellitus without complication (HCC)                 Arthritis                                                    Reproductive/Obstetrics negative OB ROS                             Anesthesia Physical Anesthesia Plan  ASA: III  Anesthesia Plan: General   Post-op Pain Management:    Induction:   Airway Management Planned:   Additional Equipment:   Intra-op Plan:   Post-operative Plan:   Informed Consent: I have reviewed the patients History and Physical, chart, labs and discussed the procedure including the risks, benefits and alternatives for the proposed anesthesia with the patient or authorized representative who has indicated his/her  understanding and acceptance.   Dental Advisory Given  Plan Discussed with: Anesthesiologist, CRNA and Surgeon  Anesthesia Plan Comments:         Anesthesia Quick Evaluation

## 2015-07-23 NOTE — H&P (Signed)
[facility]  [address] [place], [place] [address] Phone: [phone] Fax : [phone]  Primary Care Physician:  Marden Noble, MD Primary Gastroenterologist:  Dr. Allen Norris  Pre-Procedure History & Physical: HPI:  Dustin Estrada is a 70 y.o. male is here for an endoscopy.   Past Medical History  Diagnosis Date  . Diabetes mellitus without complication ([place])   . Arthritis     Past Surgical History  Procedure Laterality Date  . Knee surgery Left   . Tonsillectomy      Prior to Admission medications   Medication Sig Start Date End Date Taking? Authorizing Provider  Dextrose, Diabetic Use, (GLUCOSE PO) Take 2 tablets by mouth as needed (for low blood sugar).    Yes Historical Provider, MD  Exenatide ER (BYDUREON) 2 MG PEN  09/18/14  Yes Historical Provider, MD  Insulin Glargine (LANTUS SOLOSTAR) 100 UNIT/ML Solostar Pen  01/15/15  Yes Historical Provider, MD  insulin lispro (HUMALOG KWIKPEN) 100 UNIT/ML KiwkPen  10/23/14  Yes Historical Provider, MD  lisinopril (PRINIVIL,ZESTRIL) 20 MG tablet Take 20 mg by mouth.   Yes Historical Provider, MD  losartan (COZAAR) 100 MG tablet Take 1 tablet by mouth. 10/14/14 10/14/15 Yes Historical Provider, MD  canagliflozin (INVOKANA) 100 MG TABS tablet Take 100 mg by mouth. Reported on 07/23/2015 08/20/14   Historical Provider, MD  glucose blood (BAYER CONTOUR NEXT TEST) test strip 1 each. 11/08/14   Historical Provider, MD  insulin regular (NOVOLIN R,HUMULIN R) 100 units/mL injection Inject into the skin.    Historical Provider, MD  meloxicam (MOBIC) 15 MG tablet Take 15 mg by mouth.    Historical Provider, MD  nystatin (MYCOSTATIN) powder  09/12/14 09/12/15  Historical Provider, MD  pantoprazole (PROTONIX) 20 MG tablet Take 20 mg by mouth.    Historical Provider, MD  rivaroxaban (XARELTO) 20 MG TABS tablet Take 1 tablet by mouth. 11/14/13   Historical Provider, MD  simvastatin (ZOCOR) 20 MG tablet Take by mouth.    Historical  Provider, MD  simvastatin (ZOCOR) 40 MG tablet  09/27/14   Historical Provider, MD    Allergies as of 07/21/2015  . (No Known Allergies)    History reviewed. No pertinent family history.  Social History   Social History  . Marital Status: Married    Spouse Name: N/A  . Number of Children: N/A  . Years of Education: N/A   Occupational History  . Not on file.   Social History Main Topics  . Smoking status: Not on file  . Smokeless tobacco: Not on file  . Alcohol Use: Not on file  . Drug Use: Not on file  . Sexual Activity: Not on file   Other Topics Concern  . Not on file   Social History Narrative    Review of Systems: See HPI, otherwise negative ROS  Physical Exam: BP 155/72 mmHg  Pulse 71  Temp(Src) 96.8 F (36 C) (Tympanic)  Resp 17  Ht 5\' 7"  (1.702 m)  Wt 280 lb (127.007 kg)  BMI 43.84 kg/m2  SpO2 98% General:   Alert,  pleasant and cooperative in NAD Head:  Normocephalic and atraumatic. Neck:  Supple; no masses or thyromegaly. Lungs:  Clear throughout to auscultation.    Heart:  Regular rate and rhythm. Abdomen:  Soft, nontender and nondistended. Normal bowel sounds, without guarding, and without rebound.   Neurologic:  Alert and  oriented x4;  grossly normal neurologically.  Impression/Plan: Dustin Estrada is here for  an endoscopy to be performed for pre op gastric bypass  Risks, benefits, limitations, and alternatives regarding  endoscopy have been reviewed with the patient.  Questions have been answered.  All parties agreeable.   Lucilla Lame, MD  07/23/2015, 2:01 PM

## 2015-07-23 NOTE — Transfer of Care (Signed)
Immediate Anesthesia Transfer of Care Note  Patient: Dustin Estrada  Procedure(s) Performed: Procedure(s): ESOPHAGOGASTRODUODENOSCOPY (EGD) WITH PROPOFOL (N/A)  Patient Location: PACU  Anesthesia Type:General  Level of Consciousness: awake, alert , oriented and patient cooperative  Airway & Oxygen Therapy: Patient Spontanous Breathing and Patient connected to nasal cannula oxygen  Post-op Assessment: Report given to RN, Post -op Vital signs reviewed and stable and Patient moving all extremities  Post vital signs: Reviewed and stable  Last Vitals:  Filed Vitals:   07/23/15 1306 07/23/15 1402  BP: 155/72 129/57  Pulse: 71 74  Temp: 36 C 35.8 C  Resp: 17 13    Last Pain: There were no vitals filed for this visit.       Complications: No apparent anesthesia complications

## 2015-07-23 NOTE — Op Note (Signed)
[facility] Patient Name: Dustin Estrada Procedure Date: 07/23/2015 1:45 PM MRN: [id number] Account #: [id number] Date of Birth: November 27, 1945 Admit Type: Outpatient Age: 70 Room: [facility] ENDO ROOM 4 Gender: Male Note Status: Finalized Procedure:            Upper GI endoscopy Indications:          Morbid obesity, Preoperative assessment for bariatric                        surgery to treat morbid obesity Providers:            Lucilla Lame, MD Referring MD:         Mikeal Hawthorne. Brynda Greathouse, MD (Referring MD) Medicines:            Propofol per Anesthesia Complications:        No immediate complications. Procedure:            Pre-Anesthesia Assessment:                       - Prior to the procedure, a History and Physical was                        performed, and patient medications and allergies were                        reviewed. The patient's tolerance of previous                        anesthesia was also reviewed. The risks and benefits of                        the procedure and the sedation options and risks were                        discussed with the patient. All questions were                        answered, and informed consent was obtained. Prior                        Anticoagulants: The patient has taken no previous                        anticoagulant or antiplatelet agents. ASA Grade                        Assessment: II - A patient with mild systemic disease.                        After reviewing the risks and benefits, the patient was                        deemed in satisfactory condition to undergo the                        procedure.                       After obtaining informed consent, the endoscope was  passed under direct vision. Throughout the procedure,                        the patient's blood pressure, pulse, and oxygen                        saturations were monitored continuously. The Endoscope            was introduced through the mouth, and advanced to the                        second part of duodenum. The upper GI endoscopy was                        accomplished without difficulty. The patient tolerated                        the procedure well. Findings:      A 3 cm hiatal hernia was present.      Localized moderate inflammation characterized by erythema was found in       the gastric antrum. Biopsies were taken with a cold forceps for       histology.      The examined duodenum was normal. Impression:           - 3 cm hiatal hernia.                       - Gastritis. Biopsied.                       - Normal examined duodenum. Recommendation:       - Await pathology results.                       - Await pathology results.                       - If pathology result do not show anything worrisome                        then the patient is cleared for his Bariatric surgery. Procedure Code(s):    --- Professional ---                       [phone], Esophagogastroduodenoscopy, flexible, transoral;                        with biopsy, single or multiple Diagnosis Code(s):    --- Professional ---                       [id number], Encounter for other preprocedural examination                       E66.01, Morbid (severe) obesity due to excess calories                       K44.9, Diaphragmatic hernia without obstruction or                        gangrene  K29.70, Gastritis, unspecified, without bleeding CPT copyright 2016 American Medical Association. All rights reserved. The codes documented in this report are preliminary and upon coder review may  be revised to meet current compliance requirements. Lucilla Lame, MD 07/23/2015 1:58:10 PM This report has been signed electronically. Number of Addenda: 0 Note Initiated On: 07/23/2015 1:45 PM      [facility]

## 2015-07-24 ENCOUNTER — Telehealth: Payer: Self-pay

## 2015-07-24 ENCOUNTER — Encounter: Payer: Self-pay | Admitting: Gastroenterology

## 2015-07-24 ENCOUNTER — Other Ambulatory Visit: Payer: Self-pay

## 2015-07-24 MED ORDER — PEG 3350-KCL-NABCB-NACL-NASULF 236 G PO SOLR: 4000.0000 mL | Freq: Once | ORAL | Status: DC

## 2015-07-24 NOTE — Telephone Encounter (Signed)
Gastroenterology Pre-Procedure Review  Request Date: 07/28/15 Requesting Physician: Dr. Brynda Greathouse  PATIENT REVIEW QUESTIONS: The patient responded to the following health history questions as indicated:    1. Are you having any GI issues? no 2. Do you have a personal history of Polyps? no 3. Do you have a family history of Colon Cancer or Polyps? no 4. Diabetes Mellitus? yes (Type 2) 5. Joint replacements in the past 12 months?no 6. Major health problems in the past 3 months?no 7. Any artificial heart valves, MVP, or defibrillator?no    MEDICATIONS & ALLERGIES:    Patient reports the following regarding taking any anticoagulation/antiplatelet therapy:   Plavix, Coumadin, Eliquis, Xarelto, Lovenox, Pradaxa, Brilinta, or Effient? no Aspirin? no  Patient confirms/reports the following medications:  Current Outpatient Prescriptions  Medication Sig Dispense Refill  . canagliflozin (INVOKANA) 100 MG TABS tablet Take 100 mg by mouth. Reported on 07/23/2015    . Dextrose, Diabetic Use, (GLUCOSE PO) Take 2 tablets by mouth as needed (for low blood sugar).     . Exenatide ER (BYDUREON) 2 MG PEN     . glucose blood (BAYER CONTOUR NEXT TEST) test strip 1 each.    . Insulin Glargine (LANTUS SOLOSTAR) 100 UNIT/ML Solostar Pen     . insulin lispro (HUMALOG KWIKPEN) 100 UNIT/ML KiwkPen     . insulin regular (NOVOLIN R,HUMULIN R) 100 units/mL injection Inject into the skin.    Marland Kitchen lisinopril (PRINIVIL,ZESTRIL) 20 MG tablet Take 20 mg by mouth.    . losartan (COZAAR) 100 MG tablet Take 1 tablet by mouth.    . meloxicam (MOBIC) 15 MG tablet Take 15 mg by mouth.    . nystatin (MYCOSTATIN) powder     . pantoprazole (PROTONIX) 20 MG tablet Take 20 mg by mouth.    . rivaroxaban (XARELTO) 20 MG TABS tablet Take 1 tablet by mouth.    . simvastatin (ZOCOR) 20 MG tablet Take by mouth.    . simvastatin (ZOCOR) 40 MG tablet      No current facility-administered medications for this visit.    Patient  confirms/reports the following allergies:  No Known Allergies  No orders of the defined types were placed in this encounter.    AUTHORIZATION INFORMATION Primary Insurance: 1D#: Group #:  Secondary Insurance: 1D#: Group #:  SCHEDULE INFORMATION: Date: 07/28/15 Time: Location: [place]

## 2015-07-25 ENCOUNTER — Encounter: Payer: Self-pay | Admitting: Anesthesiology

## 2015-07-25 LAB — SURGICAL PATHOLOGY

## 2015-07-25 NOTE — Anesthesia Postprocedure Evaluation (Signed)
Anesthesia Post Note  Patient: [name]  Procedure(s) Performed: Procedure(s) (LRB): ESOPHAGOGASTRODUODENOSCOPY (EGD) WITH PROPOFOL (N/A)  Patient location during evaluation: Endoscopy Anesthesia Type: General Level of consciousness: awake and alert Pain management: pain level controlled Vital Signs Assessment: post-procedure vital signs reviewed and stable Respiratory status: spontaneous breathing, nonlabored ventilation, respiratory function stable and patient connected to nasal cannula oxygen Cardiovascular status: blood pressure returned to baseline and stable Postop Assessment: no signs of nausea or vomiting Anesthetic complications: no    Last Vitals:  Filed Vitals:   07/23/15 1421 07/23/15 1431  BP: 124/54 116/66  Pulse: 77 74  Temp:    Resp: 15 19    Last Pain: There were no vitals filed for this visit.               Martha Clan

## 2015-07-25 NOTE — Discharge Instructions (Signed)

## 2015-07-28 ENCOUNTER — Encounter: Payer: Self-pay | Admitting: Gastroenterology

## 2015-07-28 ENCOUNTER — Ambulatory Visit: Payer: Medicare Other | Admitting: Anesthesiology

## 2015-07-28 ENCOUNTER — Encounter
Admission: RE | Disposition: A | Discharge status: Home / Self Care | Payer: Self-pay | Source: Ambulatory Visit | Attending: Gastroenterology

## 2015-07-28 ENCOUNTER — Ambulatory Visit
Admission: RE | Admit: 2015-07-28 | Discharge: 2015-07-28 | Disposition: A | Discharge status: Home / Self Care | Payer: Medicare Other | Source: Ambulatory Visit | Attending: Gastroenterology | Admitting: Gastroenterology

## 2015-07-28 ENCOUNTER — Encounter: Payer: Self-pay | Admitting: *Deleted

## 2015-07-28 DIAGNOSIS — Z6841 Body Mass Index (BMI) 40.0 and over, adult: Secondary | ICD-10-CM | POA: Insufficient documentation

## 2015-07-28 DIAGNOSIS — K641 Second degree hemorrhoids: Secondary | ICD-10-CM | POA: Insufficient documentation

## 2015-07-28 DIAGNOSIS — M1990 Unspecified osteoarthritis, unspecified site: Secondary | ICD-10-CM | POA: Insufficient documentation

## 2015-07-28 DIAGNOSIS — E119 Type 2 diabetes mellitus without complications: Secondary | ICD-10-CM | POA: Insufficient documentation

## 2015-07-28 DIAGNOSIS — K219 Gastro-esophageal reflux disease without esophagitis: Secondary | ICD-10-CM | POA: Diagnosis not present

## 2015-07-28 DIAGNOSIS — Z79899 Other long term (current) drug therapy: Secondary | ICD-10-CM | POA: Diagnosis not present

## 2015-07-28 DIAGNOSIS — R0602 Shortness of breath: Secondary | ICD-10-CM | POA: Diagnosis not present

## 2015-07-28 DIAGNOSIS — G4730 Sleep apnea, unspecified: Secondary | ICD-10-CM | POA: Insufficient documentation

## 2015-07-28 DIAGNOSIS — D122 Benign neoplasm of ascending colon: Secondary | ICD-10-CM | POA: Insufficient documentation

## 2015-07-28 DIAGNOSIS — Z1211 Encounter for screening for malignant neoplasm of colon: Secondary | ICD-10-CM | POA: Diagnosis not present

## 2015-07-28 DIAGNOSIS — Z86711 Personal history of pulmonary embolism: Secondary | ICD-10-CM | POA: Insufficient documentation

## 2015-07-28 DIAGNOSIS — I10 Essential (primary) hypertension: Secondary | ICD-10-CM | POA: Insufficient documentation

## 2015-07-28 DIAGNOSIS — K5730 Diverticulosis of large intestine without perforation or abscess without bleeding: Secondary | ICD-10-CM | POA: Diagnosis not present

## 2015-07-28 DIAGNOSIS — Z794 Long term (current) use of insulin: Secondary | ICD-10-CM | POA: Insufficient documentation

## 2015-07-28 DIAGNOSIS — D123 Benign neoplasm of transverse colon: Secondary | ICD-10-CM | POA: Insufficient documentation

## 2015-07-28 HISTORY — PX: COLONOSCOPY WITH PROPOFOL: SHX5780

## 2015-07-28 HISTORY — PX: POLYPECTOMY: SHX5525

## 2015-07-28 HISTORY — DX: Reserved for inherently not codable concepts without codable children: IMO0001

## 2015-07-28 HISTORY — DX: Gastro-esophageal reflux disease without esophagitis: K21.9

## 2015-07-28 HISTORY — DX: Personal history of other venous thrombosis and embolism: Z86.718

## 2015-07-28 HISTORY — DX: Essential (primary) hypertension: I10

## 2015-07-28 HISTORY — DX: Sleep apnea, unspecified: G47.30

## 2015-07-28 LAB — GLUCOSE, CAPILLARY
GLUCOSE-CAPILLARY: 132 mg/dL — AB (ref 65–99)
Glucose-Capillary: 126 mg/dL — ABNORMAL HIGH (ref 65–99)

## 2015-07-28 SURGERY — COLONOSCOPY WITH PROPOFOL
Anesthesia: Monitor Anesthesia Care | Wound class: Contaminated

## 2015-07-28 MED ORDER — STERILE WATER FOR IRRIGATION IR SOLN
Status: DC | PRN
  Administered 2015-07-28: 08:00:00

## 2015-07-28 MED ORDER — LACTATED RINGERS IV SOLN
INTRAVENOUS | Status: DC
  Administered 2015-07-28: 07:00:00 via INTRAVENOUS

## 2015-07-28 MED ORDER — LIDOCAINE HCL (CARDIAC) 20 MG/ML IV SOLN
INTRAVENOUS | Status: DC | PRN
  Administered 2015-07-28: 50 mg via INTRAVENOUS

## 2015-07-28 MED ORDER — PROPOFOL 10 MG/ML IV BOLUS
INTRAVENOUS | Status: DC | PRN
  Administered 2015-07-28: 10 mg via INTRAVENOUS
  Administered 2015-07-28: 30 mg via INTRAVENOUS
  Administered 2015-07-28: 20 mg via INTRAVENOUS
  Administered 2015-07-28: 30 mg via INTRAVENOUS
  Administered 2015-07-28: 20 mg via INTRAVENOUS
  Administered 2015-07-28: 10 mg via INTRAVENOUS
  Administered 2015-07-28: 100 mg via INTRAVENOUS
  Administered 2015-07-28: 10 mg via INTRAVENOUS
  Administered 2015-07-28: 20 mg via INTRAVENOUS

## 2015-07-28 SURGICAL SUPPLY — 22 items
CANISTER SUCT 1200ML W/VALVE (MISCELLANEOUS) ×4 IMPLANT
CLIP HMST 235XBRD CATH ROT (MISCELLANEOUS) IMPLANT
CLIP RESOLUTION 360 11X235 (MISCELLANEOUS)
FCP ESCP3.2XJMB 240X2.8X (MISCELLANEOUS)
FORCEPS BIOP RAD 4 LRG CAP 4 (CUTTING FORCEPS) IMPLANT
FORCEPS BIOP RJ4 240 W/NDL (MISCELLANEOUS)
FORCEPS ESCP3.2XJMB 240X2.8X (MISCELLANEOUS) IMPLANT
GOWN CVR UNV OPN BCK APRN NK (MISCELLANEOUS) ×4 IMPLANT
GOWN ISOL THUMB LOOP REG UNIV (MISCELLANEOUS) ×4
INJECTOR VARIJECT VIN23 (MISCELLANEOUS) IMPLANT
KIT DEFENDO VALVE AND CONN (KITS) IMPLANT
KIT ENDO PROCEDURE OLY (KITS) ×4 IMPLANT
MARKER SPOT ENDO TATTOO 5ML (MISCELLANEOUS) IMPLANT
PAD GROUND ADULT SPLIT (MISCELLANEOUS) IMPLANT
PROBE APC STR FIRE (PROBE) IMPLANT
SNARE SHORT THROW 13M SML OVAL (MISCELLANEOUS) IMPLANT
SNARE SHORT THROW 30M LRG OVAL (MISCELLANEOUS) IMPLANT
SNARE SNG USE RND 15MM (INSTRUMENTS) IMPLANT
SPOT EX ENDOSCOPIC TATTOO (MISCELLANEOUS)
TRAP ETRAP POLY (MISCELLANEOUS) ×4 IMPLANT
VARIJECT INJECTOR VIN23 (MISCELLANEOUS)
WATER STERILE IRR 250ML POUR (IV SOLUTION) ×4 IMPLANT

## 2015-07-28 NOTE — Transfer of Care (Signed)
Immediate Anesthesia Transfer of Care Note  Patient: Dustin Estrada  Procedure(s) Performed: Procedure(s) with comments: COLONOSCOPY WITH PROPOFOL (N/A) - Insulin dependent diabetic and CPAP POLYPECTOMY  Patient Location: PACU  Anesthesia Type: MAC  Level of Consciousness: awake, alert  and patient cooperative  Airway and Oxygen Therapy: Patient Spontanous Breathing and Patient connected to supplemental oxygen  Post-op Assessment: Post-op Vital signs reviewed, Patient's Cardiovascular Status Stable, Respiratory Function Stable, Patent Airway and No signs of Nausea or vomiting  Post-op Vital Signs: Reviewed and stable  Complications: No apparent anesthesia complications

## 2015-07-28 NOTE — Anesthesia Procedure Notes (Signed)
Procedure Name: MAC Date/Time: 07/28/2015 7:35 AM Performed by: Cameron Ali Pre-anesthesia Checklist: Patient identified, Emergency Drugs available, Suction available, Timeout performed and Patient being monitored Patient Re-evaluated:Patient Re-evaluated prior to inductionOxygen Delivery Method: Nasal cannula Placement Confirmation: positive ETCO2

## 2015-07-28 NOTE — Anesthesia Preprocedure Evaluation (Signed)
Anesthesia Evaluation  Patient identified by MRN, date of birth, ID band Patient awake    Reviewed: Allergy & Precautions, H&P , NPO status , Patient's Chart, lab work & pertinent test results, reviewed documented beta blocker date and time   Airway Mallampati: IV  TM Distance: >3 FB Neck ROM: full    Dental no notable dental hx. (+) Implants, Missing, Chipped, Poor Dentition   Pulmonary shortness of breath and with exertion, sleep apnea and Continuous Positive Airway Pressure Ventilation , neg COPD, neg recent URI,    Pulmonary exam normal breath sounds clear to auscultation       Cardiovascular Exercise Tolerance: Poor hypertension, (-) angina+ DOE  (-) CAD, (-) Past MI, (-) Cardiac Stents and (-) CABG Normal cardiovascular exam(-) dysrhythmias (-) Valvular Problems/Murmurs Rhythm:regular Rate:Normal     Neuro/Psych negative neurological ROS  negative psych ROS   GI/Hepatic Neg liver ROS, GERD  Medicated and Controlled,  Endo/Other  diabetes, Well Controlled, Type 2, Insulin DependentMorbid obesity  Renal/GU negative Renal ROS  negative genitourinary   Musculoskeletal   Abdominal   Peds  Hematology negative hematology ROS (+)   Anesthesia Other Findings Past Medical History:   Diabetes mellitus without complication (H[facility])                 Arthritis                                                    Reproductive/Obstetrics negative OB ROS                             Anesthesia Physical Anesthesia Plan  ASA: III  Anesthesia Plan: MAC   Post-op Pain Management:    Induction: Intravenous  Airway Management Planned:   Additional Equipment:   Intra-op Plan:   Post-operative Plan:   Informed Consent: I have reviewed the patients History and Physical, chart, labs and discussed the procedure including the risks, benefits and alternatives for the proposed anesthesia with the patient or  authorized representative who has indicated his/her understanding and acceptance.     Plan Discussed with: Anesthesiologist, CRNA and Surgeon  Anesthesia Plan Comments:         Anesthesia Quick Evaluation

## 2015-07-28 NOTE — Op Note (Signed)
[facility] Gastroenterology Patient Name: Dustin Estrada Procedure Date: 07/28/2015 7:33 AM MRN: [id number] Account #: [id number] Date of Birth: 08-Oct-1945 Admit Type: Outpatient Age: 70 Room: [facility] OR ROOM 01 Gender: Male Note Status: Finalized Procedure:            Colonoscopy Indications:          Screening for colorectal malignant neoplasm Providers:            Lucilla Lame, MD Referring MD:         Mikeal Hawthorne. Brynda Greathouse MD, MD (Referring MD) Medicines:            Propofol per Anesthesia Complications:        No immediate complications. Procedure:            Pre-Anesthesia Assessment:                       - Prior to the procedure, a History and Physical was                        performed, and patient medications and allergies were                        reviewed. The patient's tolerance of previous                        anesthesia was also reviewed. The risks and benefits of                        the procedure and the sedation options and risks were                        discussed with the patient. All questions were                        answered, and informed consent was obtained. Prior                        Anticoagulants: The patient has taken no previous                        anticoagulant or antiplatelet agents. ASA Grade                        Assessment: II - A patient with mild systemic disease.                        After reviewing the risks and benefits, the patient was                        deemed in satisfactory condition to undergo the                        procedure.                       After obtaining informed consent, the colonoscope was                        passed under direct vision. Throughout the procedure,  the patient's blood pressure, pulse, and oxygen                        saturations were monitored continuously. The Olympus                        CF-HQ190L Colonoscope (S#. [id number]) was introduced                         through the anus and advanced to the the cecum,                        identified by appendiceal orifice and ileocecal valve.                        The colonoscopy was performed without difficulty. The                        patient tolerated the procedure well. The quality of                        the bowel preparation was good. Findings:      The perianal and digital rectal examinations were normal.      A 10 mm polyp was found in the ascending colon. The polyp was sessile.       The polyp was removed with a hot snare. Resection and retrieval were       complete. To prevent bleeding after the polypectomy, one hemostatic clip       was successfully placed (MR conditional). There was no bleeding at the       end of the procedure.      A 5 mm polyp was found in the transverse colon. The polyp was sessile.       The polyp was removed with a cold snare. Resection and retrieval were       complete.      Multiple small-mouthed diverticula were found in the sigmoid colon.      Non-bleeding internal hemorrhoids were found during retroflexion. The       hemorrhoids were Grade II (internal hemorrhoids that prolapse but reduce       spontaneously). Impression:           - One 10 mm polyp in the ascending colon, removed with                        a hot snare. Resected and retrieved. Clip (MR                        conditional) was placed.                       - One 5 mm polyp in the transverse colon, removed with                        a cold snare. Resected and retrieved.                       - Diverticulosis in the sigmoid colon.                       - Non-bleeding internal hemorrhoids.  Recommendation:       - Await pathology results.                       - Repeat colonoscopy in 5 years if polyp adenoma and 10                        years if hyperplastic Procedure Code(s):    --- Professional ---                       [phone], Colonoscopy, flexible; with removal of tumor(s),                         polyp(s), or other lesion(s) by snare technique Diagnosis Code(s):    --- Professional ---                       Z12.11, Encounter for screening for malignant neoplasm                        of colon                       D12.2, Benign neoplasm of ascending colon                       D12.3, Benign neoplasm of transverse colon (hepatic                        flexure or splenic flexure) CPT copyright 2016 American Medical Association. All rights reserved. The codes documented in this report are preliminary and upon coder review may  be revised to meet current compliance requirements. Lucilla Lame, MD 07/28/2015 7:58:31 AM This report has been signed electronically. Number of Addenda: 0 Note Initiated On: 07/28/2015 7:33 AM Scope Withdrawal Time: 0 hours 10 minutes 15 seconds  Total Procedure Duration: 0 hours 17 minutes 2 seconds       [facility]

## 2015-07-28 NOTE — Anesthesia Postprocedure Evaluation (Signed)
Anesthesia Post Note  Patient: Dustin Estrada  Procedure(s) Performed: Procedure(s) (LRB): COLONOSCOPY WITH PROPOFOL (N/A) POLYPECTOMY  Patient location during evaluation: PACU Anesthesia Type: General Level of consciousness: awake and alert Pain management: pain level controlled Vital Signs Assessment: post-procedure vital signs reviewed and stable Respiratory status: spontaneous breathing, nonlabored ventilation, respiratory function stable and patient connected to nasal cannula oxygen Cardiovascular status: blood pressure returned to baseline and stable Postop Assessment: no signs of nausea or vomiting Anesthetic complications: no    Marshell Levan

## 2015-07-28 NOTE — H&P (Signed)
Dustin Lame, MD [address] [place], [place] [address] Phone: [phone] Fax : [phone]  Primary Care Physician:  Dustin Noble, MD Primary Gastroenterologist:  Dr. Allen Estrada  Pre-Procedure History & Physical: HPI:  Dustin Estrada is a 70 y.o. male is here for a screening colonoscopy.   Past Medical History  Diagnosis Date  . Arthritis   . Hypertension   . Sleep apnea     cpap  . Shortness of breath dyspnea     on exertion  . Diabetes mellitus without complication ([place])     type 2  . GERD (gastroesophageal reflux disease)   . Hx of blood clots Nov 2016    lung    Past Surgical History  Procedure Laterality Date  . Knee surgery Left   . Esophagogastroduodenoscopy (egd) with propofol N/A 07/23/2015    Procedure: ESOPHAGOGASTRODUODENOSCOPY (EGD) WITH PROPOFOL;  Surgeon: Dustin Lame, MD;  Location: [facility] ENDOSCOPY;  Service: Endoscopy;  Laterality: N/A;    Prior to Admission medications   Medication Sig Start Date End Date Taking? Authorizing Provider  b complex vitamins tablet Take 1 tablet by mouth daily.   Yes Historical Provider, MD  Dextrose, Diabetic Use, (GLUCOSE PO) Take 2 tablets by mouth as needed (for low blood sugar).    Yes Historical Provider, MD  Exenatide ER (BYDUREON) 2 MG PEN Inject into the skin. 2 mg /week Tuesday   Yes Historical Provider, MD  glucose blood (BAYER CONTOUR NEXT TEST) test strip 1 each. 11/08/14  Yes Historical Provider, MD  Insulin Glargine (LANTUS SOLOSTAR) 100 UNIT/ML Solostar Pen 60 Units 2 (two) times daily. Am and pm 01/15/15  Yes Historical Provider, MD  insulin lispro (HUMALOG KWIKPEN) 100 UNIT/ML KiwkPen 4 times a day on a sliding scale 10/23/14  Yes Historical Provider, MD  losartan (COZAAR) 50 MG tablet Take 50 mg by mouth daily. am   Yes Historical Provider, MD  pantoprazole (PROTONIX) 40 MG tablet Take 40 mg by mouth daily. am   Yes Historical Provider, MD  polyethylene glycol (GOLYTELY) 236 g solution Take 4,000  mLs by mouth once. Drink one 8 oz glass every 20 mins until stools are clear. 07/24/15  Yes Dustin Lame, MD  rosuvastatin (CRESTOR) 40 MG tablet 40 mg daily. am 06/03/15  Yes Historical Provider, MD    Allergies as of 07/24/2015  . (No Known Allergies)    History reviewed. No pertinent family history.  Social History   Social History  . Marital Status: Married    Spouse Name: N/A  . Number of Children: N/A  . Years of Education: N/A   Occupational History  . Not on file.   Social History Main Topics  . Smoking status: Never Smoker   . Smokeless tobacco: Never Used  . Alcohol Use: No  . Drug Use: No  . Sexual Activity: Not on file   Other Topics Concern  . Not on file   Social History Narrative    Review of Systems: See HPI, otherwise negative ROS  Physical Exam: BP 146/67 mmHg  Pulse 66  Temp(Src) 98.1 F (36.7 C)  Resp 16  Ht 5\' 7"  (1.702 m)  Wt 287 lb (130.182 kg)  BMI 44.94 kg/m2  SpO2 98% General:   Alert,  pleasant and cooperative in NAD Head:  Normocephalic and atraumatic. Neck:  Supple; no masses or thyromegaly. Lungs:  Clear throughout to auscultation.    Heart:  Regular rate and rhythm. Abdomen:  Soft, nontender and nondistended.  Normal bowel sounds, without guarding, and without rebound.   Neurologic:  Alert and  oriented x4;  grossly normal neurologically.  Impression/Plan: Dustin Estrada is now here to undergo a screening colonoscopy.  Risks, benefits, and alternatives regarding colonoscopy have been reviewed with the patient.  Questions have been answered.  All parties agreeable.

## 2015-07-29 ENCOUNTER — Encounter: Payer: Self-pay | Admitting: Gastroenterology

## 2015-08-04 ENCOUNTER — Encounter: Payer: Self-pay | Admitting: Gastroenterology

## 2015-11-14 IMAGING — CT CT ANGIO CHEST
2 of 6 series · 18 of 36 positions shown · IV contrast (isovue)
Comparison: None.

CLINICAL DATA: pt with episode of sob, chest pain, and chest
pressure. states he has been having these episodes more and more. no
hx of surgery to chest or any hx of ca. 100ml of Isovue 370 used

EXAM:
CT ANGIOGRAPHY CHEST WITH CONTRAST
TECHNIQUE: Multidetector CT imaging of the chest was performed using the
standard protocol during bolus administration of intravenous
contrast. Multiplanar CT image reconstructions and MIPs were
obtained to evaluate the vascular anatomy.
CONTRAST:  100 mL Isovue 370 IV

[Series 6: pe 1.0 thins · axial · 0.73mm/px · z∈[-454,-192]mm · 17 of 297 slices shown]
[im 17/297  lung]
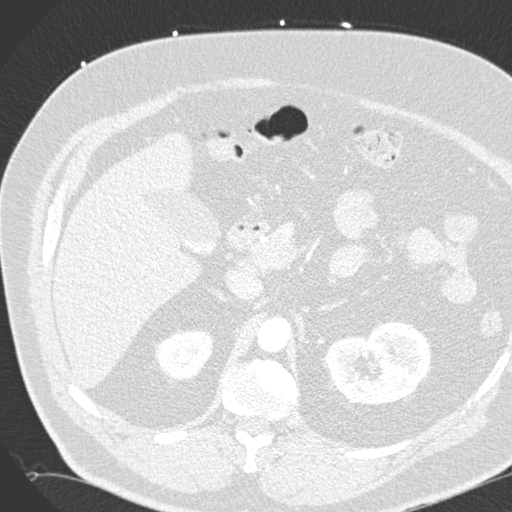
[im 33/297  mediastinal]
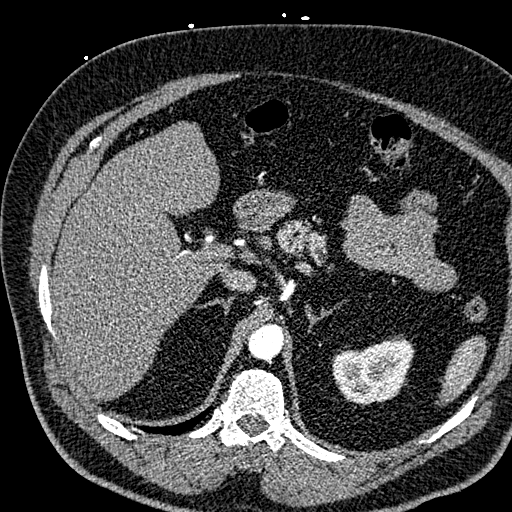
[im 50/297  lung]
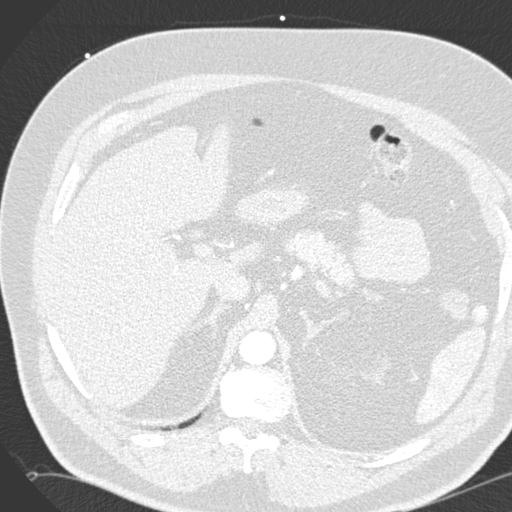
[im 66/297  mediastinal]
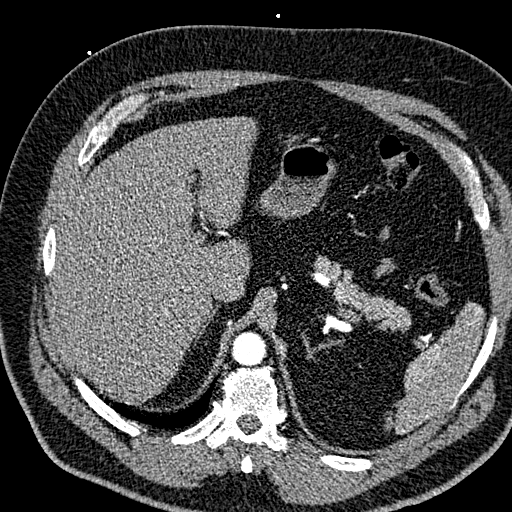
[im 83/297  lung]
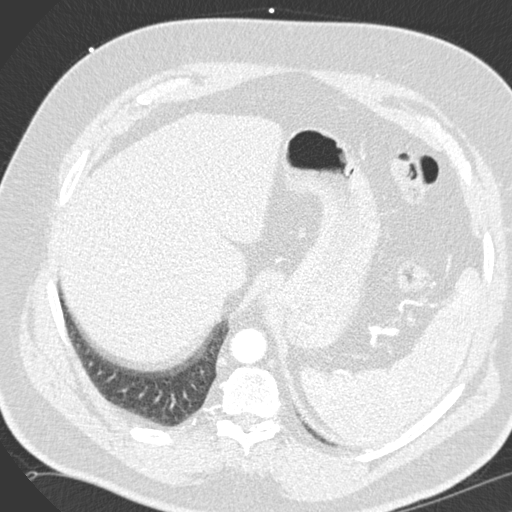
[im 99/297  mediastinal]
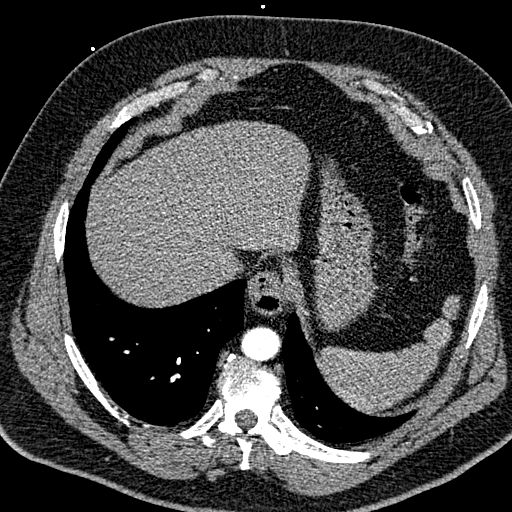
[im 116/297  lung]
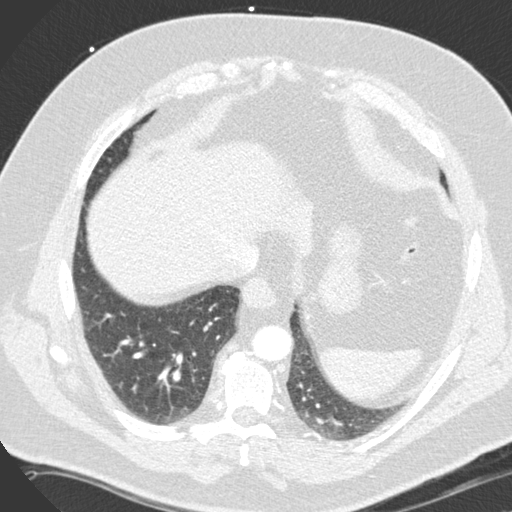
[im 132/297  mediastinal]
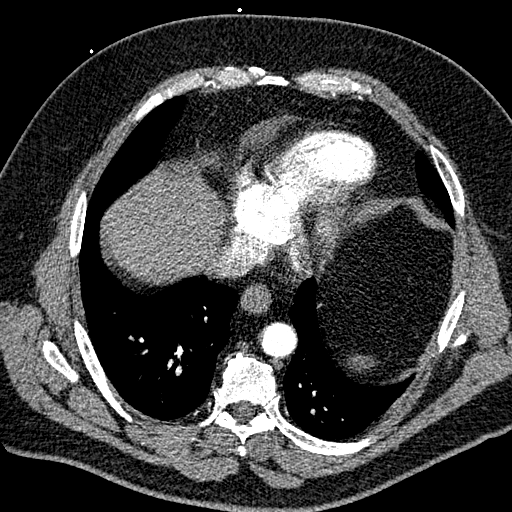
[im 149/297  lung]
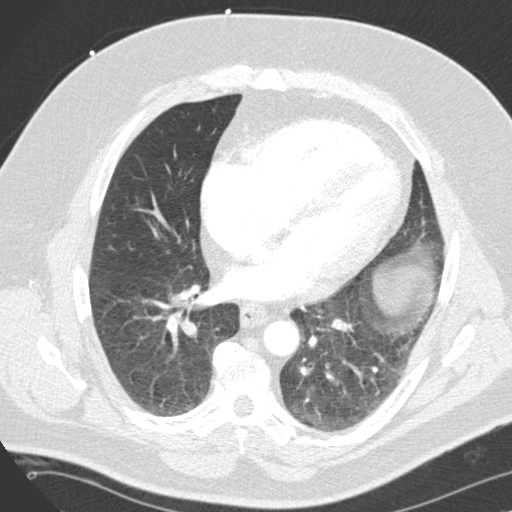
[im 165/297  mediastinal]
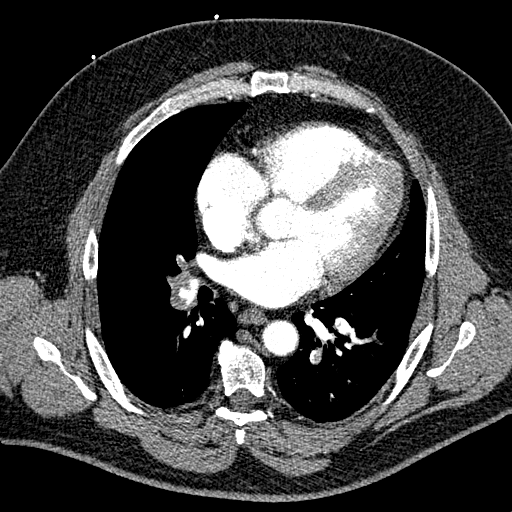
[im 181/297  lung]
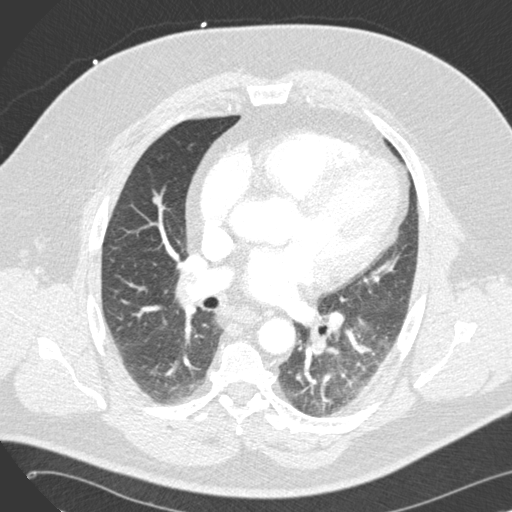
[im 198/297  mediastinal]
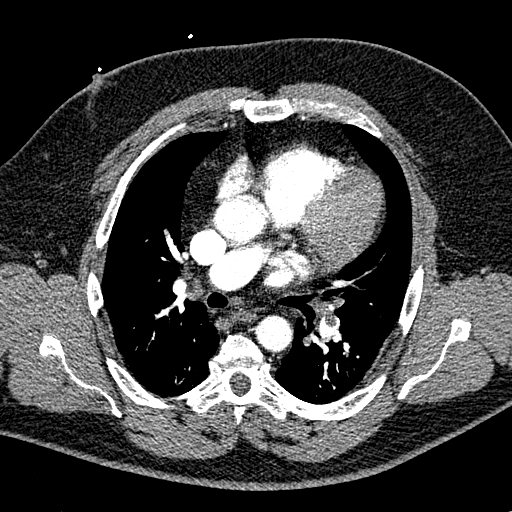
[im 214/297  lung]
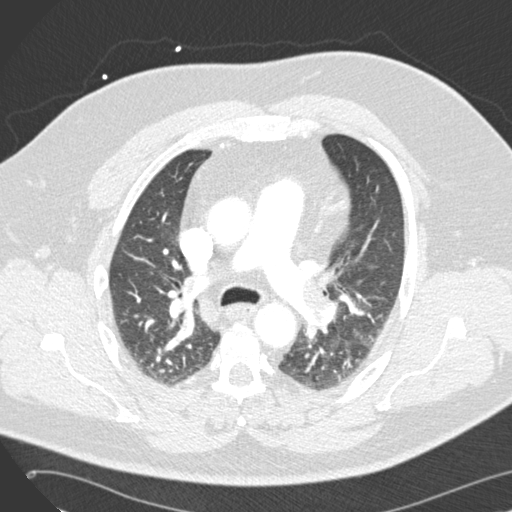
[im 231/297  mediastinal]
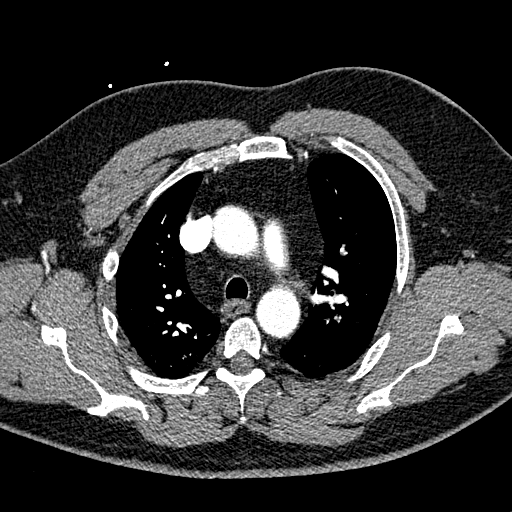
[im 247/297  lung]
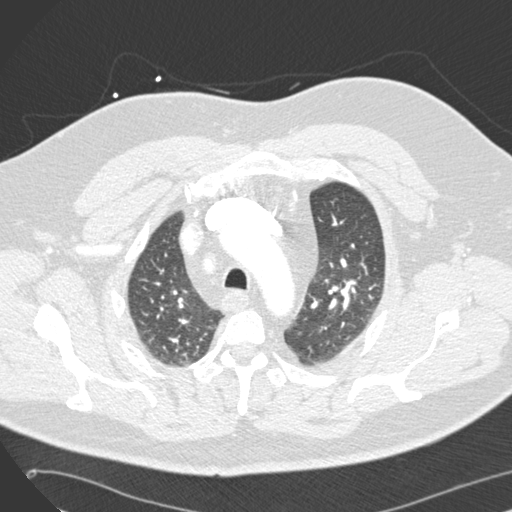
[im 264/297  mediastinal]
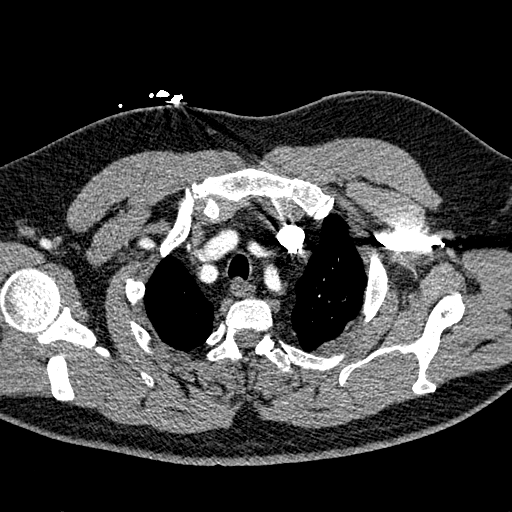
[im 280/297  lung]
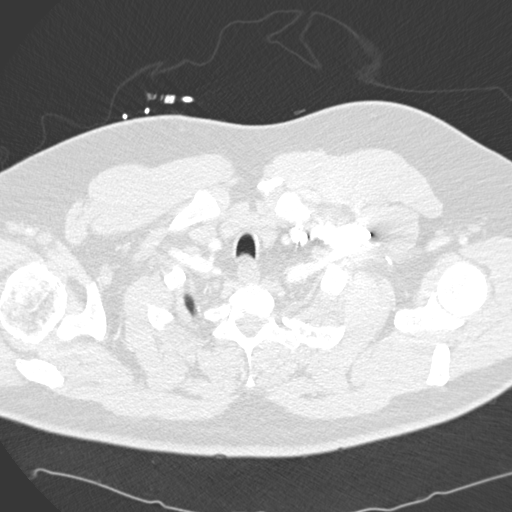

[Series 8: cor pe 2.0 mpr · coronal · 0.58mm/px · 1 of 176 slices shown]
[im 88/176  mediastinal]
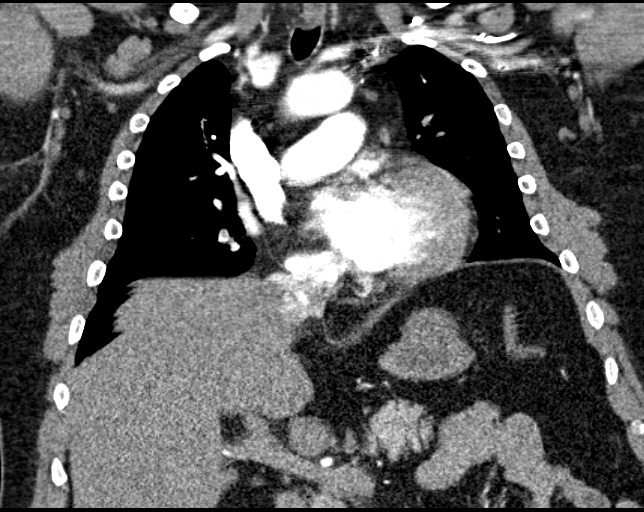

[18 of 36 positions shown; findings below may reference images not displayed]

FINDINGS: Moderate bilateral central and segmental occlusive pulmonary emboli.
Elevated RV/LV ratio of 1.57. No pleural or pericardial effusion.
Minimal dependent atelectasis posteriorly in both lower lobes. Lungs
otherwise clear.

Adequate contrast opacification of the thoracic aorta with no
evidence of dissection, aneurysm, or stenosis. Classic 3 vessel
Brachiocephalic arch anatomy without proximal stenosis. There is no
hilar or mediastinal adenopathy. Anterior spurring at multiple
levels in the mid and lower thoracic spine. Sternum intact. Multiple
right and small partially calcified stones in the dependent aspect
of the gallbladder. Remainder visualized upper abdomen unremarkable.

Review of the MIP images confirms the above findings.
IMPRESSION: 1. Positive for acute PE with CT evidence of right heart strain
(RV/LV Ratio = 1.57 ) consistent with at least submassive
(intermediate risk) PE. The presence of right heart strain has been
associated with an increased risk of morbidity and mortality.
Critical Value/emergent results were called by telephone at the time
of interpretation on 10/27/2013 at [DATE] to Dr. DARRYL WEATHERS ,
who verbally acknowledged these results.
2. Cholelithiasis.

## 2016-10-19 ENCOUNTER — Other Ambulatory Visit: Payer: Self-pay | Admitting: Internal Medicine

## 2016-10-19 ENCOUNTER — Ambulatory Visit
Admission: RE | Admit: 2016-10-19 | Discharge: 2016-10-19 | Disposition: A | Discharge status: Home / Self Care | Payer: Medicare Other | Source: Ambulatory Visit | Attending: Internal Medicine | Admitting: Internal Medicine

## 2016-10-19 DIAGNOSIS — I82401 Acute embolism and thrombosis of unspecified deep veins of right lower extremity: Secondary | ICD-10-CM | POA: Insufficient documentation

## 2016-10-29 ENCOUNTER — Encounter: Payer: Self-pay | Admitting: Medical Oncology

## 2016-10-29 ENCOUNTER — Emergency Department: Payer: Medicare Other

## 2016-10-29 ENCOUNTER — Emergency Department
Admission: EM | Admit: 2016-10-29 | Discharge: 2016-10-29 | Disposition: A | Discharge status: Home / Self Care | Payer: Medicare Other | Attending: Emergency Medicine | Admitting: Emergency Medicine

## 2016-10-29 DIAGNOSIS — E119 Type 2 diabetes mellitus without complications: Secondary | ICD-10-CM | POA: Diagnosis not present

## 2016-10-29 DIAGNOSIS — I10 Essential (primary) hypertension: Secondary | ICD-10-CM | POA: Diagnosis not present

## 2016-10-29 DIAGNOSIS — M79604 Pain in right leg: Secondary | ICD-10-CM | POA: Diagnosis present

## 2016-10-29 DIAGNOSIS — I82431 Acute embolism and thrombosis of right popliteal vein: Secondary | ICD-10-CM | POA: Diagnosis not present

## 2016-10-29 LAB — CBC WITH DIFFERENTIAL/PLATELET
BASOS PCT: 0 %
Basophils Absolute: 0 10*3/uL (ref 0–0.1)
Eosinophils Absolute: 0.2 10*3/uL (ref 0–0.7)
Eosinophils Relative: 2 %
HEMATOCRIT: 36.2 % — AB (ref 40.0–52.0)
Hemoglobin: 12.5 g/dL — ABNORMAL LOW (ref 13.0–18.0)
LYMPHS ABS: 1.2 10*3/uL (ref 1.0–3.6)
Lymphocytes Relative: 13 %
MCH: 32.9 pg (ref 26.0–34.0)
MCHC: 34.6 g/dL (ref 32.0–36.0)
MCV: 94.9 fL (ref 80.0–100.0)
MONO ABS: 0.6 10*3/uL (ref 0.2–1.0)
MONOS PCT: 7 %
NEUTROS ABS: 7.3 10*3/uL — AB (ref 1.4–6.5)
Neutrophils Relative %: 78 %
Platelets: 239 10*3/uL (ref 150–440)
RBC: 3.82 MIL/uL — ABNORMAL LOW (ref 4.40–5.90)
RDW: 14.2 % (ref 11.5–14.5)
WBC: 9.4 10*3/uL (ref 3.8–10.6)

## 2016-10-29 LAB — COMPREHENSIVE METABOLIC PANEL
ALBUMIN: 4 g/dL (ref 3.5–5.0)
ALT: 19 U/L (ref 17–63)
ANION GAP: 7 (ref 5–15)
AST: 20 U/L (ref 15–41)
Alkaline Phosphatase: 72 U/L (ref 38–126)
BILIRUBIN TOTAL: 1 mg/dL (ref 0.3–1.2)
BUN: 17 mg/dL (ref 6–20)
CO2: 23 mmol/L (ref 22–32)
Calcium: 9.3 mg/dL (ref 8.9–10.3)
Chloride: 110 mmol/L (ref 101–111)
Creatinine, Ser: 1.01 mg/dL (ref 0.61–1.24)
Glucose, Bld: 188 mg/dL — ABNORMAL HIGH (ref 65–99)
POTASSIUM: 4.3 mmol/L (ref 3.5–5.1)
Sodium: 140 mmol/L (ref 135–145)
TOTAL PROTEIN: 6.9 g/dL (ref 6.5–8.1)

## 2016-10-29 LAB — PROTIME-INR
INR: 1.31
Prothrombin Time: 16.2 seconds — ABNORMAL HIGH (ref 11.4–15.2)

## 2016-10-29 MED ORDER — ENOXAPARIN SODIUM 100 MG/ML ~~LOC~~ SOLN
1.0000 mg/kg | Freq: Once | SUBCUTANEOUS | Status: AC
  Administered 2016-10-29: 95 mg via SUBCUTANEOUS
  Filled 2016-10-29: qty 1

## 2016-10-29 MED ORDER — OXYCODONE-ACETAMINOPHEN 5-325 MG PO TABS: 1.0000 | ORAL_TABLET | Freq: Three times a day (TID) | ORAL | 0 refills | Status: DC | PRN

## 2016-10-29 NOTE — ED Provider Notes (Signed)
[facility] Emergency Department Provider Note       Time seen: ----------------------------------------- 11:55 AM on 10/29/2016 -----------------------------------------     I have reviewed the triage vital signs and the nursing notes.   HISTORY   Chief Complaint Leg Pain    HPI Dustin Estrada is a 71 y.o. male who presents to the ED for reevaluation for right leg pain. Patient reports 2 weeks ago he was diagnosed with blood, behind his right knee. Patient states he's been having increasing pressure and redness around the ankle since then. He has been seen by his cardiologist in place on antibiotic for possible cellulitis. Patient reports he's been taking Coumadin for years.   Past Medical History:  Diagnosis Date  . Arthritis   . Diabetes mellitus without complication ([place])    type 2  . GERD (gastroesophageal reflux disease)   . Hx of blood clots Nov 2016   lung  . Hypertension   . Shortness of breath dyspnea    on exertion  . Sleep apnea    cpap    Patient Active Problem List   Diagnosis Date Noted  . Special screening for malignant neoplasms, colon   . Benign neoplasm of ascending colon   . Benign neoplasm of transverse colon   . Preop examination   . Morbid obesity ([place])   . Hiatal hernia   . Gastritis   . Type 2 diabetes mellitus ([place]) 07/21/2015  . Acid reflux 07/21/2015  . Morbid (severe) obesity due to excess calories ([place]) 07/21/2015  . Arthritis, degenerative 07/21/2015  . Diabetes mellitus ([place]) 07/24/2013  . BP (high blood pressure) 07/24/2013  . HLD (hyperlipidemia) 07/24/2013    Past Surgical History:  Procedure Laterality Date  . COLONOSCOPY WITH PROPOFOL N/A 07/28/2015   Procedure: COLONOSCOPY WITH PROPOFOL;  Surgeon: Lucilla Lame, MD;  Location: [place];  Service: Endoscopy;  Laterality: N/A;  Insulin dependent diabetic and CPAP  . ESOPHAGOGASTRODUODENOSCOPY (EGD) WITH PROPOFOL N/A 07/23/2015   Procedure:  ESOPHAGOGASTRODUODENOSCOPY (EGD) WITH PROPOFOL;  Surgeon: Lucilla Lame, MD;  Location: [facility] END[facility]COPY;  Service: Endoscopy;  Laterality: N/A;  . KNEE SURGERY Left   . POLYPECTOMY  07/28/2015   Procedure: POLYPECTOMY;  Surgeon: Lucilla Lame, MD;  Location: [place];  Service: Endoscopy;;    Allergies Patient has no known allergies.  Social History Social History  Substance Use Topics  . Smoking status: Never Smoker  . Smokeless tobacco: Never Used  . Alcohol use No    Review of Systems Constitutional: Negative for fever. Cardiovascular: Negative for chest pain. Respiratory: Negative for shortness of breath. Gastrointestinal: Negative for abdominal pain, vomiting and diarrhea. Genitourinary: Negative for dysuria. Musculoskeletal: Positive for right leg pain Skin: Negative for rash. Neurological: Negative for headaches, focal weakness or numbness.  All systems negative/normal/unremarkable except as stated in the HPI  ____________________________________________   PHYSICAL EXAM:  VITAL SIGNS: ED Triage Vitals  Enc Vitals Group     BP 10/29/16 1023 134/81     Pulse Rate 10/29/16 1023 68     Resp 10/29/16 1023 18     Temp 10/29/16 1023 98.3 F (36.8 C)     Temp Source 10/29/16 1023 Oral     SpO2 10/29/16 1023 98 %     Weight 10/29/16 1024 210 lb (95.3 kg)     Height 10/29/16 1024 5\' 8"  (1.727 m)     Head Circumference --      Peak Flow --  Pain Score 10/29/16 1023 0     Pain Loc --      Pain Edu? --      Excl. in [place]? --     Constitutional: Alert and oriented. Well appearing and in no distress. Eyes: Conjunctivae are normal. Normal extraocular movements. ENT   Head: Normocephalic and atraumatic.   Nose: No congestion/rhinnorhea.   Mouth/Throat: Mucous membranes are moist.   Neck: No stridor. Cardiovascular: Normal rate, regular rhythm. No murmurs, rubs, or gallops. Respiratory: Normal respiratory effort without tachypnea nor retractions.  Breath sounds are clear and equal bilaterally. No wheezes/rales/rhonchi. Gastrointestinal: Soft and nontender. Normal bowel sounds Musculoskeletal: Nontender with normal range of motion in extremities. No lower extremity tenderness nor edema. Neurologic:  Right lower extremity is edematous and tender, particular a below the knee to the ankle. There is erythema and ecchymosis around his foot and ankle Skin:  Erythema and ecchymosis as dictated above Psychiatric: Mood and affect are normal. Speech and behavior are normal.  ____________________________________________  ED COURSE:  Pertinent labs & imaging results that were available during my care of the patient were reviewed by me and considered in my medical decision making (see chart for details). Patient presents for leg pain, we will assess with labs and imaging as indicated.   Procedures ____________________________________________   LABS (pertinent positives/negatives)  Labs Reviewed  CBC WITH DIFFERENTIAL/PLATELET - Abnormal; Notable for the following:       Result Value   RBC 3.82 (*)    Hemoglobin 12.5 (*)    HCT 36.2 (*)    Neutro Abs 7.3 (*)    All other components within normal limits  COMPREHENSIVE METABOLIC PANEL - Abnormal; Notable for the following:    Glucose, Bld 188 (*)    All other components within normal limits  PROTIME-INR - Abnormal; Notable for the following:    Prothrombin Time 16.2 (*)    All other components within normal limits    RADIOLOGY Images were viewed by me  Right lower extremity ultrasound IMPRESSION: 1. 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 No other evidence of deep venous thrombosis in the right lower extremity. 2. Multiple fluid collections in the soft tissues of the right lateral thigh are of uncertain etiology and significance, but could represent old chronic hematomas (reportedly the patient is  on Coumadin). ____________________________________________  FINAL ASSESSMENT AND PLAN  DVT   Plan: Patient's labs and imaging were dictated above. Patient had presented for Recheck of his right leg with a known diagnosis of DVT in the past. He does have an older appearing thrombus in the right popliteal vein but this was artery known. I will give a dose of Lovenox because he is subtherapeutic on Coumadin. He has likely had some subcutaneous bleeding due to recent elevated INR. He is stable for outpatient follow-up.   Earleen Newport, MD   Note: This note was generated in part or whole with voice recognition software. Voice recognition is usually quite accurate but there are transcription errors that can and very often do occur. I apologize for any typographical errors that were not detected and corrected.     Earleen Newport, MD 10/29/16 [phone]

## 2016-10-29 NOTE — ED Notes (Addendum)
Was on coumadin and was supra-therapeutic and stopped it.  Then it became subtherapeutic.  He did received VIT K at time when INR was high

## 2016-10-29 NOTE — ED Triage Notes (Signed)
Pt reports 2 weeks ago he was diagnosed with blood clot behind his rt knee. Pt states that he has been having incresing pressure and redness around ankle area since then. Pt has been seen by his cardiologist and placed on abx for possible cellulitis. Pt reports that he has been taking coumadin for years.

## 2016-11-02 ENCOUNTER — Ambulatory Visit (INDEPENDENT_AMBULATORY_CARE_PROVIDER_SITE_OTHER): Payer: Medicare Other | Admitting: Vascular Surgery

## 2016-11-02 ENCOUNTER — Encounter (INDEPENDENT_AMBULATORY_CARE_PROVIDER_SITE_OTHER): Payer: Self-pay | Admitting: Vascular Surgery

## 2016-11-02 VITALS — BP 139/76 | HR 68 | Resp 16 | Ht 67.5 in | Wt 217.0 lb

## 2016-11-02 DIAGNOSIS — S8010XA Contusion of unspecified lower leg, initial encounter: Secondary | ICD-10-CM | POA: Insufficient documentation

## 2016-11-02 DIAGNOSIS — E785 Hyperlipidemia, unspecified: Secondary | ICD-10-CM

## 2016-11-02 DIAGNOSIS — Z794 Long term (current) use of insulin: Secondary | ICD-10-CM | POA: Diagnosis not present

## 2016-11-02 DIAGNOSIS — I10 Essential (primary) hypertension: Secondary | ICD-10-CM | POA: Diagnosis not present

## 2016-11-02 DIAGNOSIS — S8011XA Contusion of right lower leg, initial encounter: Secondary | ICD-10-CM | POA: Diagnosis not present

## 2016-11-02 DIAGNOSIS — E118 Type 2 diabetes mellitus with unspecified complications: Secondary | ICD-10-CM | POA: Diagnosis not present

## 2016-11-02 NOTE — Assessment & Plan Note (Signed)
blood glucose control important in reducing the progression of atherosclerotic disease. Also, involved in wound healing. On appropriate medications.  

## 2016-11-02 NOTE — Assessment & Plan Note (Signed)
lipid control important in reducing the progression of atherosclerotic disease. Continue statin therapy  

## 2016-11-02 NOTE — Progress Notes (Signed)
Patient ID: Dustin Estrada, male   DOB: 05-Dec-1945, 71 y.o.   MRN: [id number]  Chief Complaint  Patient presents with  . Leg Pain    HPI Dustin Estrada is a 71 y.o. male.  I am asked to see the patient by Dr. Jimmye Norman in the ER for evaluation of leg pain and a hematoma.  The patient reports DVT and pulmonary embolus history several years ago. He has been maintained on Coumadin. He had what sounds like a reasonably minor trauma to the right thigh about 2 weeks ago and a resultant very large goose egg that developed after the injury. 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 He is now developing worsening pain and discoloration in the lower part of the leg. This is associated with significant swelling. He is back on his anticoagulation but not running supratherapeutic. The bruising and tenderness on the leg have gradually been improving. He has limited his activity since this happened.   Past Medical History:  Diagnosis Date  . Arthritis   . Diabetes mellitus without complication ([place])    type 2  . GERD (gastroesophageal reflux disease)   . Hx of blood clots Nov 2016   lung  . Hypertension   . Shortness of breath dyspnea    on exertion  . Sleep apnea    cpap    Past Surgical History:  Procedure Laterality Date  . COLONOSCOPY WITH PROPOFOL N/A 07/28/2015   Procedure: COLONOSCOPY WITH PROPOFOL;  Surgeon: Lucilla Lame, MD;  [facility];  Service: Endoscopy;  Laterality: N/A;  Insulin dependent diabetic and CPAP  . ESOPHAGOGASTRODUODENOSCOPY (EGD) WITH PROPOFOL N/A 07/23/2015   Procedure: ESOPHAGOGASTRODUODENOSCOPY (EGD) WITH PROPOFOL;  Surgeon: Lucilla Lame, MD;  Location: [facility];  Service: Endoscopy;  Laterality: N/A;  . KNEE SURGERY Left   . POLYPECTOMY  07/28/2015   Procedure: POLYPECTOMY;  Surgeon: Lucilla Lame, MD;  Location: [facility];  Service:  Endoscopy;;    Family History  Problem Relation Age of Onset  . Diabetes Father   . Heart attack Maternal Uncle   . Heart attack Maternal Grandfather   No bleeding or clotting disorders  Social History Social History  Substance Use Topics  . Smoking status: Never Smoker  . Smokeless tobacco: Never Used  . Alcohol use No  No IVDU  No Known Allergies  Current Outpatient Prescriptions  Medication Sig Dispense Refill  . atorvastatin (LIPITOR) 20 MG tablet   11  . cephALEXin (KEFLEX) 500 MG capsule   0  . losartan (COZAAR) 50 MG tablet Take 50 mg by mouth daily. am    . TOUJEO SOLOSTAR 300 UNIT/ML SOPN   1  . warfarin (COUMADIN) 5 MG tablet TAKE ONE TABLET BY MOUTH ONCE DAILY    . b complex vitamins tablet Take 1 tablet by mouth daily.    Marland Kitchen Dextrose, Diabetic Use, (GLUCOSE PO) Take 2 tablets by mouth as needed (for low blood sugar).     . Exenatide ER (BYDUREON) 2 MG PEN Inject into the skin. 2 mg /week Tuesday    . glucose blood (BAYER CONTOUR NEXT TEST) test strip 1 each.    . Insulin Glargine (LANTUS SOLOSTAR) 100 UNIT/ML Solostar Pen 60 Units 2 (two) times daily. Am and pm    . insulin lispro (HUMALOG KWIKPEN) 100 UNIT/ML KiwkPen 4 times a day on a sliding scale    .  oxyCODONE-acetaminophen (PERCOCET) 5-325 MG tablet Take 1-2 tablets by mouth every 8 (eight) hours as needed. (Patient not taking: Reported on 11/02/2016) 20 tablet 0  . pantoprazole (PROTONIX) 40 MG tablet Take 40 mg by mouth daily. am    . polyethylene glycol (GOLYTELY) 236 g solution Take 4,000 mLs by mouth once. Drink one 8 oz glass every 20 mins until stools are clear. (Patient not taking: Reported on 11/02/2016) 4000 mL 0  . rosuvastatin (CRESTOR) 40 MG tablet 40 mg daily. am     No current facility-administered medications for this visit.       REVIEW OF SYSTEMS (Negative unless checked)  Constitutional: '[]'$ Weight loss  '[]'$ Fever  '[]'$ Chills Cardiac: '[]'$ Chest pain   '[]'$ Chest pressure   '[x]'$ Palpitations    '[]'$ Shortness of breath when laying flat   '[]'$ Shortness of breath at rest   '[]'$ Shortness of breath with exertion. Vascular:  '[]'$ Pain in legs with walking   '[x]'$ Pain in legs at rest   '[]'$ Pain in legs when laying flat   '[]'$ Claudication   '[]'$ Pain in feet when walking  '[]'$ Pain in feet at rest  '[]'$ Pain in feet when laying flat   '[x]'$ History of DVT   '[x]'$ Phlebitis   '[x]'$ Swelling in legs   '[]'$ Varicose veins   '[]'$ Non-healing ulcers Pulmonary:   '[]'$ Uses home oxygen   '[]'$ Productive cough   '[]'$ Hemoptysis   '[]'$ Wheeze  '[]'$ COPD   '[]'$ Asthma Neurologic:  '[]'$ Dizziness  '[]'$ Blackouts   '[]'$ Seizures   '[]'$ History of stroke   '[]'$ History of TIA  '[]'$ Aphasia   '[]'$ Temporary blindness   '[]'$ Dysphagia   '[]'$ Weakness or numbness in arms   '[]'$ Weakness or numbness in legs Musculoskeletal:  '[]'$ Arthritis   '[]'$ Joint swelling   '[]'$ Joint pain   '[]'$ Low back pain Hematologic:  '[x]'$ Easy bruising  '[]'$ Easy bleeding   '[]'$ Hypercoagulable state   '[]'$ Anemic  '[]'$ Hepatitis Gastrointestinal:  '[]'$ Blood in stool   '[]'$ Vomiting blood  '[]'$ Gastroesophageal reflux/heartburn   '[]'$ Abdominal pain Genitourinary:  '[]'$ Chronic kidney disease   '[]'$ Difficult urination  '[]'$ Frequent urination  '[]'$ Burning with urination   '[]'$ Hematuria Skin:  '[]'$ Rashes   '[]'$ Ulcers   '[]'$ Wounds Psychological:  '[]'$ History of anxiety   '[]'$  History of major depression.    Physical Exam BP 139/76   Pulse 68   Resp 16   Ht 5' 7.5" (1.715 m)   Wt 217 lb (98.4 kg)   BMI 33.49 kg/m  Gen:  WD/WN, NAD Head: [place]/AT, No temporalis wasting. Prominent temp pulse not noted. Ear/Nose/Throat: Hearing grossly intact, nares w/o erythema or drainage, oropharynx w/o Erythema/Exudate Eyes: Conjunctiva clear, sclera non-icteric  Neck: trachea midline.  No JVD.  Pulmonary:  Good air movement, respirations not labored, no use of accessory muscles Cardiac: RRR, no JVD Vascular:  Vessel Right Left  Radial Palpable Palpable                          PT Not Palpable 1+ Palpable  DP 1+ Palpable Palpable    Musculoskeletal: M/S 5/5 throughout.   Extremities without ischemic changes.  No deformity or atrophy. 2+ right lower extremity edema, trace left lower extremity edema. Bruising and stasis changes are present in the right lower leg  Neurologic: Sensation grossly intact in extremities.  Symmetrical.  Speech is fluent. Motor exam as listed above. Psychiatric: Judgment intact, Mood & affect appropriate for pt's clinical situation. Dermatologic: No rashes or ulcers noted.  No cellulitis or open wounds.  Radiology US Venous Img Lower Unilateral Right  Result Date: 10/29/2016 CLINICAL DATA:  71 year old male with  history of right leg swelling for the past 2 weeks. EXAM: RIGHT LOWER EXTREMITY VENOUS DOPPLER ULTRASOUND TECHNIQUE: Gray-scale sonography with graded compression, as well as color Doppler and duplex ultrasound were performed to evaluate the lower extremity deep venous systems from the level of the common femoral vein and including the common femoral, femoral, profunda femoral, popliteal and calf veins including the posterior tibial, peroneal and gastrocnemius veins when visible. The superficial great saphenous vein was also interrogated. Spectral Doppler was utilized to evaluate flow at rest and with distal augmentation maneuvers in the common femoral, femoral and popliteal veins. COMPARISON:  10/19/2016. FINDINGS: Contralateral Common Femoral Vein: Respiratory phasicity is normal and symmetric with the symptomatic side. No evidence of thrombus. Normal compressibility. Common Femoral Vein: No evidence of thrombus. Normal compressibility, respiratory phasicity and response to augmentation. Saphenofemoral Junction: No evidence of thrombus. Normal compressibility and flow on color Doppler imaging. Profunda Femoral Vein: No evidence of thrombus. Normal compressibility and flow on color Doppler imaging. Femoral Vein: No evidence of thrombus. Normal compressibility, respiratory phasicity and response to augmentation. Popliteal Vein: Small amount of  echogenic material in the anterior branch of the right popliteal vein suspicious for chronic thrombus. This is nonocclusive. The vein does compress with normal phasicity and augmentation. Calf Veins: No evidence of thrombus. Normal compressibility and flow on color Doppler imaging. Superficial Great Saphenous Vein: No evidence of thrombus. Normal compressibility and flow on color Doppler imaging. Venous Reflux:  None. Other Findings: Several hypoechoic collections in the right lateral thigh which demonstrate increased through transmission and no internal blood flow. IMPRESSION: 1. 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 No other evidence of deep venous thrombosis in the right lower extremity. 2. Multiple fluid collections in the soft tissues of the right lateral thigh are of uncertain etiology and significance, but could represent old chronic hematomas (reportedly the patient is on Coumadin). Electronically Signed   By: Vinnie Langton M.D.   On: 10/29/2016 12:31   US Venous Img Lower Unilateral Right  Result Date: 10/19/2016 CLINICAL DATA:  Right lower extremity edema EXAM: RIGHT LOWER EXTREMITY VENOUS DUPLEX ULTRASOUND TECHNIQUE: Gray-scale sonography with graded compression, as well as color Doppler and duplex ultrasound were performed to evaluate the right lower extremity deep venous system from the level of the common femoral vein and including the common femoral, femoral, profunda femoral, popliteal and calf veins including the posterior tibial, peroneal and gastrocnemius veins when visible. The superficial great saphenous vein was also interrogated. Spectral Doppler was utilized to evaluate flow at rest and with distal augmentation maneuvers in the common femoral, femoral and popliteal veins. COMPARISON:  October 27, 2013 FINDINGS: Contralateral Common Femoral Vein: Respiratory phasicity is normal and symmetric with the symptomatic side. No  evidence of thrombus. Normal compressibility. Common Femoral Vein: No evidence of thrombus. Normal compressibility, respiratory phasicity and response to augmentation. Saphenofemoral Junction: No evidence of thrombus. Normal compressibility and flow on color Doppler imaging. Profunda Femoral Vein: No evidence of thrombus. Normal compressibility and flow on color Doppler imaging. Femoral Vein: No evidence of thrombus. Normal compressibility, respiratory phasicity and response to augmentation. Popliteal Vein: There is incompletely obstructing thrombus in the right popliteal vein which is somewhat echogenic and may be chronic. There is diminished Doppler signal in this area with diminished compression and augmentation. Calf Veins: No evidence of thrombus. Normal compressibility and flow on color Doppler imaging. Superficial Great Saphenous Vein: No evidence of thrombus. Normal compressibility and flow on color  Doppler imaging. Venous Reflux:  None. Other Findings: There is a mildly complex focus of decreased attenuation in the lateral right thigh at the site of skin bruising measuring 1.6 x 0.7 x 1.8 cm. 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 No other evidence of deep venous thrombosis in the right lower extremity. Left common femoral vein patent. Probable liquefying hematoma lateral right thigh. These results will be called to the ordering clinician or representative by the Radiology Department at the imaging location. Electronically Signed   By: Lowella Grip III M.D.   On: 10/19/2016 13:32    Labs Recent Results (from the past 2160 hour(s))  CBC with Differential     Status: Abnormal   Collection Time: 10/29/16 10:38 AM  Result Value Ref Range   WBC 9.4 3.8 - 10.6 K/uL   RBC 3.82 (L) 4.40 - 5.90 MIL/uL   Hemoglobin 12.5 (L) 13.0 - 18.0 g/dL   HCT 36.2 (L) 40.0 - 52.0 %   MCV 94.9 80.0 - 100.0 fL   MCH 32.9 26.0 - 34.0 pg   MCHC 34.6 32.0 -  36.0 g/dL   RDW 14.2 11.5 - 14.5 %   Platelets 239 150 - 440 K/uL   Neutrophils Relative % 78 %   Neutro Abs 7.3 (H) 1.4 - 6.5 K/uL   Lymphocytes Relative 13 %   Lymphs Abs 1.2 1.0 - 3.6 K/uL   Monocytes Relative 7 %   Monocytes Absolute 0.6 0.2 - 1.0 K/uL   Eosinophils Relative 2 %   Eosinophils Absolute 0.2 0 - 0.7 K/uL   Basophils Relative 0 %   Basophils Absolute 0.0 0 - 0.1 K/uL  Comprehensive metabolic panel     Status: Abnormal   Collection Time: 10/29/16 10:38 AM  Result Value Ref Range   Sodium 140 135 - 145 mmol/L   Potassium 4.3 3.5 - 5.1 mmol/L   Chloride 110 101 - 111 mmol/L   CO2 23 22 - 32 mmol/L   Glucose, Bld 188 (H) 65 - 99 mg/dL   BUN 17 6 - 20 mg/dL   Creatinine, Ser 1.01 0.61 - 1.24 mg/dL   Calcium 9.3 8.9 - 10.3 mg/dL   Total Protein 6.9 6.5 - 8.1 g/dL   Albumin 4.0 3.5 - 5.0 g/dL   AST 20 15 - 41 U/L   ALT 19 17 - 63 U/L   Alkaline Phosphatase 72 38 - 126 U/L   Total Bilirubin 1.0 0.3 - 1.2 mg/dL   GFR calc non Af Amer >60 >60 mL/min   GFR calc Af Amer >60 >60 mL/min    Comment: (NOTE) The eGFR has been calculated using the CKD EPI equation. This calculation has not been validated in all clinical situations. eGFR's persistently <60 mL/min signify possible Chronic Kidney Disease.    Anion gap 7 5 - 15  Protime-INR     Status: Abnormal   Collection Time: 10/29/16 10:38 AM  Result Value Ref Range   Prothrombin Time 16.2 (H) 11.4 - 15.2 seconds   INR 1.31     Assessment/Plan:  BP (high blood pressure) blood pressure control important in reducing the progression of atherosclerotic disease. On appropriate oral medications.   Type 2 diabetes mellitus (HCC) blood glucose control important in reducing the progression of atherosclerotic disease. Also, involved in wound healing. On appropriate medications.   HLD (hyperlipidemia) lipid control important in reducing the progression of atherosclerotic disease. Continue statin therapy   Hematoma of  leg  The patient had a hematoma from a mild injury supratherapeutic on heparin in the right leg. This is gradually improving, but we'll certainly take several weeks to completely improved. The fact that he has had a previous DVT on that side likely increases his venous pressure is making the recovery slower. If he can tolerate compression stockings, I think that will help the swelling and discoloration. He should elevate his legs and try to remain active. I will see him back in about a month for reassessment.      Leotis Pain 11/02/2016, 4:33 PM   This note was created with Dragon medical transcription system.  Any errors from dictation are unintentional.

## 2016-11-02 NOTE — Assessment & Plan Note (Signed)
blood pressure control important in reducing the progression of atherosclerotic disease. On appropriate oral medications.  

## 2016-11-02 NOTE — Patient Instructions (Signed)
Edema Edema is when you have too much fluid in your body or under your skin. Edema may make your legs, feet, and ankles swell up. Swelling is also common in looser tissues, like around your eyes. This is a common condition. It gets more common as you get older. There are many possible causes of edema. Eating too much salt (sodium) and being on your feet or sitting for a long time can cause edema in your legs, feet, and ankles. Hot weather may make edema worse. Edema is usually painless. Your skin may look swollen or shiny. Follow these instructions at home:  Keep the swollen body part raised (elevated) above the level of your heart when you are sitting or lying down.  Do not sit still or stand for a long time.  Do not wear tight clothes. Do not wear garters on your upper legs.  Exercise your legs. This can help the swelling go down.  Wear elastic bandages or support stockings as told by your doctor.  Eat a low-salt (low-sodium) diet to reduce fluid as told by your doctor.  Depending on the cause of your swelling, you may need to limit how much fluid you drink (fluid restriction).  Take over-the-counter and prescription medicines only as told by your doctor. Contact a doctor if:  Treatment is not working.  You have heart, liver, or kidney disease and have symptoms of edema.  You have sudden and unexplained weight gain. Get help right away if:  You have shortness of breath or chest pain.  You cannot breathe when you lie down.  You have pain, redness, or warmth in the swollen areas.  You have heart, liver, or kidney disease and get edema all of a sudden.  You have a fever and your symptoms get worse all of a sudden. Summary  Edema is when you have too much fluid in your body or under your skin.  Edema may make your legs, feet, and ankles swell up. Swelling is also common in looser tissues, like around your eyes.  Raise (elevate) the swollen body part above the level of your  heart when you are sitting or lying down.  Follow your doctor's instructions about diet and how much fluid you can drink (fluid restriction). This information is not intended to replace advice given to you by your health care provider. Make sure you discuss any questions you have with your health care provider. Document Released: 07/21/2007 Document Revised: 02/20/2016 Document Reviewed: 02/20/2016 Elsevier Interactive Patient Education  2017 [employer]  

## 2016-11-02 NOTE — Assessment & Plan Note (Signed)
The patient had a hematoma from a mild injury supratherapeutic on heparin in the right leg. This is gradually improving, but we'll certainly take several weeks to completely improved. The fact that he has had a previous DVT on that side likely increases his venous pressure is making the recovery slower. If he can tolerate compression stockings, I think that will help the swelling and discoloration. He should elevate his legs and try to remain active. I will see him back in about a month for reassessment.

## 2016-12-10 ENCOUNTER — Ambulatory Visit (INDEPENDENT_AMBULATORY_CARE_PROVIDER_SITE_OTHER): Payer: Medicare Other | Admitting: Vascular Surgery

## 2018-01-19 ENCOUNTER — Ambulatory Visit: Payer: Self-pay | Admitting: General Surgery

## 2018-01-19 NOTE — H&P (Signed)
PATIENT PROFILE: Dustin Estrada is a 72 y.o. male who presents to the Clinic for consultation at the request of Dr. Edwina Estrada for evaluation of a large left thigh skin lesion.  PCP:  Dustin Ochs, MD  HISTORY OF PRESENT ILLNESS: Dustin Estrada reports having a large skin lesion on his posterior thigh since more than 15 years ago. The patient refers that the skin lesion is getting bigger with time. There is no significant pain. No prior history of infection. No pain radiation. No aggravator or improving factors. The wife had uterine cancer and his is worried that this could be or convert to cancer. Patient has not personal history of skin cancer.    PROBLEM LIST:         Problem List  Date Reviewed: 01/04/2018         Noted   Class 1 obesity due to excess calories without serious comorbidity with body mass index (BMI) of 30.0 to 30.9 in adult 07/26/2017   Overview    S/P bariatric surgery      Atrial fibrillation, chronic 07/26/2017   Hematoma of leg 11/02/2016   Overview    Last Assessment & Plan:  The patient had a hematoma from a mild injury supratherapeutic on heparin in the right leg. This is gradually improving, but we'll certainly take several weeks to completely improved. The fact that he has had a previous DVT on that side likely increases his venous pressure is making the recovery slower. If he can tolerate compression stockings, I think that will help the swelling and discoloration. He should elevate his legs and try to remain active. I will see him back in about a month for reassessment.      Type 2 diabetes mellitus (CMS-HCC) 07/21/2015   Overview    Last Assessment & Plan:  blood glucose control important in reducing the progression of atherosclerotic disease. Also, involved in wound healing. On appropriate medications.      Obstructive sleep apnea 06/25/2015   Gastro-esophageal reflux disease without esophagitis 04/16/2015   HTN (hypertension)  07/24/2013   Hyperlipidemia 07/24/2013      GENERAL REVIEW OF SYSTEMS:   General ROS: negative for - chills, fatigue, fever, weight gain or weight loss Allergy and Immunology ROS: negative for - hives  Hematological and Lymphatic ROS: negative for - bleeding problems or bruising, negative for palpable nodes Endocrine ROS: negative for - heat or cold intolerance, hair changes Respiratory ROS: negative for - cough, shortness of breath or wheezing Cardiovascular ROS: no chest pain or palpitations GI ROS: negative for nausea, vomiting, abdominal pain, diarrhea, constipation Musculoskeletal ROS: negative for - joint swelling or muscle pain Neurological ROS: negative for - confusion, syncope Dermatological ROS: negative for pruritus and rash. Positive for skin mass Psychiatric: negative for anxiety, depression, difficulty sleeping and memory loss  MEDICATIONS: CurrentMedications        Current Outpatient Medications  Medication Sig Dispense Refill  . atorvastatin (LIPITOR) 20 MG tablet   2  . blood glucose diagnostic test strip Use 1 each 3 (three) times daily. Use as instructed.    . calcium carbonate-vitamin D3 (CALTRATE 600+D) 600 mg(1,500mg ) -200 unit tablet Take 1 tablet by mouth once daily    . docusate (COLACE) 50 MG capsule Take 250 mg by mouth once daily    . insulin glargine U-300 conc 300 unit/mL (3 mL) InPn Inject 36 Units subcutaneously once daily      . losartan (COZAAR) 50 MG tablet Take 50 mg by  mouth once.    2  . VITAMIN B COMPLEX ORAL Take by mouth.    . warfarin (COUMADIN) 5 MG tablet TAKE 1 TABLET BY MOUTH ONCE DAILY 30 tablet 3   No current facility-administered medications for this visit.       ALLERGIES: Patient has no known allergies.  PAST MEDICAL HISTORY:     Past Medical History:  Diagnosis Date  . Diabetes mellitus type 2, uncomplicated (CMS-HCC)   . Hiatal hernia   . Hyperlipidemia   . Hypertension   . Pulmonary  embolism (CMS-HCC)     PAST SURGICAL HISTORY:      Past Surgical History:  Procedure Laterality Date  . ARTHROSCOPY ANKLE    . duodenal switch  6/202017  . KNEE ARTHROSCOPY    . LAPAROSCOPIC CHOLECYSTECTOMY    . REPAIR HIATAL HERNIA       FAMILY HISTORY:      Family History  Problem Relation Age of Onset  . No Known Problems Mother   . Diabetes Father      SOCIAL HISTORY: Social History          Socioeconomic History  . Marital status: Married    Spouse name: Dustin Estrada  . Number of children: Not on file  . Years of education: Not on file  . Highest education level: Not on file  Occupational History  . Occupation: auctioner  Social Needs  . Financial resource strain: Patient refused  . Food insecurity:    Worry: Patient refused    Inability: Patient refused  . Transportation needs:    Medical: Patient refused    Non-medical: Patient refused  Tobacco Use  . Smoking status: Former Smoker    Packs/day: 1.00    Years: 2.00    Pack years: 2.00  . Smokeless tobacco: Never Used  . Tobacco comment: no tobacco use  Substance and Sexual Activity  . Alcohol use: No  . Drug use: No  . Sexual activity: Yes    Partners: Female  Other Topics Concern  . Not on file  Social History Narrative  . Not on file      PHYSICAL EXAM:    Vitals:   01/17/18 1454  BP: 123/76  Pulse: 74  Temp: 37.6 C (99.6 F)   Body mass index is 34.6 kg/m. Weight: 100.2 kg (220 lb 14.4 oz)   GENERAL: Alert, active, oriented x3  HEENT: Pupils equal reactive to light. Extraocular movements are intact. Sclera clear. Palpebral conjunctiva normal red color.  NECK: Supple with no palpable mass and no adenopathy.  LUNGS: Sound clear with no rales rhonchi or wheezes.  HEART: Regular rhythm S1 and S2 without murmur.  ABDOMEN: Soft and depressible, nontender with no palpable mass, no hepatomegaly.   EXTREMITIES: Well-developed well-nourished  symmetrical with no dependent edema. Left posterior thigh large mass > 4 cm, peduculated, large base, no erythema or secretions.   NEUROLOGICAL: Awake alert oriented, facial expression symmetrical, moving all extremities.  REVIEW OF DATA: I have reviewed the following data today:      Ancillary Orders on 12/20/2017  Component Date Value  . Prothrombin Time 12/20/2017 25.8*  . Prothrombin INR 12/20/2017 2.3   Ancillary Orders on 11/15/2017  Component Date Value  . Prothrombin Time 11/15/2017 26.9*  . Prothrombin INR 11/15/2017 2.4      ASSESSMENT: Mr. Sanfilippo is a 72 y.o. male presenting for consultation for large skin mass. The patient has a mass on the left posterior thigh that is causing anxiety  of being cancerous and is getting bigger in size. Patient oriented that the mass is most likely benign but since it is increasing in size excision can be considered. Patient oriented about the procedure, benefits and risk. The patient is currently taking coumadin due to a fib. Due to the size of the mass and patient on coumadin oriented the patient that this will be a more difficult case and will need to be done in the OR for better lighting, equipment and hemostasis. Patient will need clearance by cardiologist and recommendations of managing the Coumadin. Patient refers understand and agrees with plan.    PLAN: 1. Excision of posterior thigh mass (02/27/2018) (11406) 2. CBC, CMP 3. PT/INR the day of surgery 4. Cardiology clearance and recommendations of Coumadin management.  5. Contact us if has any question or concern.   Patient verbalized understanding, all questions were answered, and were agreeable with the plan outlined above.   Herbert Pun, MD  Electronically signed by Herbert Pun, MD

## 2018-02-20 ENCOUNTER — Encounter
Admission: RE | Admit: 2018-02-20 | Discharge: 2018-02-20 | Disposition: A | Discharge status: Home / Self Care | Payer: Medicare Other | Source: Ambulatory Visit | Attending: General Surgery | Admitting: General Surgery

## 2018-02-20 ENCOUNTER — Other Ambulatory Visit: Payer: Self-pay

## 2018-02-20 DIAGNOSIS — I10 Essential (primary) hypertension: Secondary | ICD-10-CM | POA: Insufficient documentation

## 2018-02-20 DIAGNOSIS — I4510 Unspecified right bundle-branch block: Secondary | ICD-10-CM | POA: Insufficient documentation

## 2018-02-20 DIAGNOSIS — Z01818 Encounter for other preprocedural examination: Secondary | ICD-10-CM | POA: Diagnosis present

## 2018-02-20 NOTE — Pre-Procedure Instructions (Signed)
   ECG 12 lead;08/07/2015 [facility] Result Impression  Narrow QRS tachycardia Left axis deviation Incomplete right bundle branch block LVH with repolarization abnormality Abnormal ECG No previous ECGs available recoomend repat EKG at lower heart rate, probable atypical atrial flutter on present EKG Confirmed by DAW, RICHARD J. ([id number]) on 08/07/2015 7:05:59 PM  Result Narrative  Ventricular Rate: 145 Atrial Rate: 145 P-R Interval: 146 QRS Duration: 94 Q-T Interval: 258 QTC Calculation(Bezet): 400 R Axis: -74 T Axis: 145  Other Result Information  Interface, Imaging Results In - 08/07/2015  7:06 PM EDT Ventricular Rate: 145 Atrial Rate: 145 P-R Interval: 146 QRS Duration: 94 Q-T Interval: 258 QTC Calculation(Bezet): 400 R Axis: -32 T Axis: 145 Impression: Narrow QRS tachycardia Left axis deviation Incomplete right bundle branch block LVH with repolarization abnormality Abnormal ECG No previous ECGs available recoomend repat EKG at lower heart rate, probable atypical atrial flutter on present EKG Confirmed by DAW, RICHARD J. ([id number]) on 08/07/2015 7:05:59 PM

## 2018-02-20 NOTE — Patient Instructions (Signed)
Your procedure is scheduled on: Monday 02/27/2018 Report to [place]. To find out your arrival time please call [phone] between 1PM - 3PM on Friday 02/24/2018.  Remember: Instructions that are not followed completely may result in serious medical risk, up to and including death, or upon the discretion of your surgeon and anesthesiologist your surgery may need to be rescheduled.     _X__ 1. Do not eat food after midnight the night before your procedure.                 No gum chewing or hard candies. You may drink clear liquids up to 2 hours                 before you are scheduled to arrive for your surgery- DO not drink clear                 liquids within 2 hours of the start of your surgery.                 Clear Liquids include:  water, apple juice without pulp, clear carbohydrate                 drink such as Clearfast or Gatorade, Black Coffee or Tea (Do not add                 anything to coffee or tea).  __X__2.  On the morning of surgery brush your teeth with toothpaste and water, you                 may rinse your mouth with mouthwash if you wish.  Do not swallow any              toothpaste of mouthwash.     _X__ 3.  No Alcohol for 24 hours before or after surgery.   _X__ 4.  Do Not Smoke or use e-cigarettes For 24 Hours Prior to Your Surgery.                 Do not use any chewable tobacco products for at least 6 hours prior to                 surgery.  ____  5.  Bring all medications with you on the day of surgery if instructed.   __X__  6.  Notify your doctor if there is any change in your medical condition      (cold, fever, infections).     Do not wear jewelry, make-up, hairpins, clips or nail polish. Do not wear lotions, powders, or perfumes.  Do not shave 48 hours prior to surgery. Men may shave face and neck. Do not bring valuables to the hospital.    [facility] is not responsible for any belongings or  valuables.  Contacts, dentures/partials or body piercings may not be worn into surgery. Bring a case for your contacts, glasses or hearing aids, a denture cup will be supplied. Leave your suitcase in the car. After surgery it may be brought to your room. For patients admitted to the hospital, discharge time is determined by your treatment team.   Patients discharged the day of surgery will not be allowed to drive home.   Please read over the following fact sheets that you were given:   MRSA Information  __X__ Take these medicines the morning of surgery with A SIP OF WATER:  1. Take medicine as usual the night before procedure.  2.   3.   4.  5.  6.  ____ Fleet Enema (as directed)   __X__ Use CHG Soap/SAGE wipes as directed  ____ Use inhalers on the day of surgery  ____ Stop metformin/Janumet/Farxiga 2 days prior to surgery    ____ Take 1/2 of usual insulin dose the night before surgery. No insulin the morning          of surgery.   __X__ Stop Blood Thinners Coumadin/Plavix/Xarelto/Pleta/Pradaxa/Eliquis/Effient/Aspirin  on AS INSTRUCTED      Or contact your Surgeon, Cardiologist or Medical Doctor regarding  ability to stop your blood thinners  __X__ Stop Anti-inflammatories 7 days before surgery such as Advil, Ibuprofen, Motrin,  BC or Goodies Powder, Naprosyn, Naproxen, Aleve, Aspirin TYLENOL OK   __X__ Stop all herbal supplements, fish oil or vitamin E until after surgery.  YOUR VITAMINS ARE OK  ____ Bring C-Pap to the hospital.

## 2018-02-26 MED ORDER — CEFAZOLIN SODIUM-DEXTROSE 2-4 GM/100ML-% IV SOLN
2.0000 g | INTRAVENOUS | Status: AC
  Administered 2018-02-27: 2 g via INTRAVENOUS

## 2018-02-27 ENCOUNTER — Ambulatory Visit: Payer: Medicare Other | Admitting: Certified Registered Nurse Anesthetist

## 2018-02-27 ENCOUNTER — Other Ambulatory Visit: Payer: Self-pay

## 2018-02-27 ENCOUNTER — Encounter
Admission: RE | Disposition: A | Discharge status: Home / Self Care | Payer: Self-pay | Source: Ambulatory Visit | Attending: General Surgery

## 2018-02-27 ENCOUNTER — Ambulatory Visit
Admission: RE | Admit: 2018-02-27 | Discharge: 2018-02-27 | Disposition: A | Discharge status: Home / Self Care | Payer: Medicare Other | Source: Ambulatory Visit | Attending: General Surgery | Admitting: General Surgery

## 2018-02-27 DIAGNOSIS — Z9884 Bariatric surgery status: Secondary | ICD-10-CM | POA: Insufficient documentation

## 2018-02-27 DIAGNOSIS — Z87891 Personal history of nicotine dependence: Secondary | ICD-10-CM | POA: Diagnosis not present

## 2018-02-27 DIAGNOSIS — G4733 Obstructive sleep apnea (adult) (pediatric): Secondary | ICD-10-CM | POA: Diagnosis not present

## 2018-02-27 DIAGNOSIS — Z809 Family history of malignant neoplasm, unspecified: Secondary | ICD-10-CM | POA: Diagnosis not present

## 2018-02-27 DIAGNOSIS — Z9989 Dependence on other enabling machines and devices: Secondary | ICD-10-CM | POA: Insufficient documentation

## 2018-02-27 DIAGNOSIS — L988 Other specified disorders of the skin and subcutaneous tissue: Secondary | ICD-10-CM | POA: Diagnosis present

## 2018-02-27 DIAGNOSIS — Z86718 Personal history of other venous thrombosis and embolism: Secondary | ICD-10-CM | POA: Diagnosis not present

## 2018-02-27 DIAGNOSIS — I482 Chronic atrial fibrillation, unspecified: Secondary | ICD-10-CM | POA: Diagnosis not present

## 2018-02-27 DIAGNOSIS — Z6833 Body mass index (BMI) 33.0-33.9, adult: Secondary | ICD-10-CM | POA: Diagnosis not present

## 2018-02-27 DIAGNOSIS — Z794 Long term (current) use of insulin: Secondary | ICD-10-CM | POA: Insufficient documentation

## 2018-02-27 DIAGNOSIS — Z7901 Long term (current) use of anticoagulants: Secondary | ICD-10-CM | POA: Insufficient documentation

## 2018-02-27 DIAGNOSIS — E785 Hyperlipidemia, unspecified: Secondary | ICD-10-CM | POA: Insufficient documentation

## 2018-02-27 DIAGNOSIS — I10 Essential (primary) hypertension: Secondary | ICD-10-CM | POA: Insufficient documentation

## 2018-02-27 DIAGNOSIS — K219 Gastro-esophageal reflux disease without esophagitis: Secondary | ICD-10-CM | POA: Diagnosis not present

## 2018-02-27 DIAGNOSIS — E6609 Other obesity due to excess calories: Secondary | ICD-10-CM | POA: Insufficient documentation

## 2018-02-27 DIAGNOSIS — Z79899 Other long term (current) drug therapy: Secondary | ICD-10-CM | POA: Diagnosis not present

## 2018-02-27 DIAGNOSIS — L918 Other hypertrophic disorders of the skin: Secondary | ICD-10-CM | POA: Diagnosis not present

## 2018-02-27 DIAGNOSIS — Z86711 Personal history of pulmonary embolism: Secondary | ICD-10-CM | POA: Diagnosis not present

## 2018-02-27 DIAGNOSIS — E119 Type 2 diabetes mellitus without complications: Secondary | ICD-10-CM | POA: Insufficient documentation

## 2018-02-27 HISTORY — PX: MASS EXCISION: SHX2000

## 2018-02-27 LAB — GLUCOSE, CAPILLARY
Glucose-Capillary: 134 mg/dL — ABNORMAL HIGH (ref 70–99)
Glucose-Capillary: 114 mg/dL — ABNORMAL HIGH (ref 70–99)

## 2018-02-27 LAB — PROTIME-INR
INR: 1.01
Prothrombin Time: 13.2 seconds (ref 11.4–15.2)

## 2018-02-27 SURGERY — EXCISION MASS
Anesthesia: General | Wound class: Clean

## 2018-02-27 MED ORDER — HYDROCODONE-ACETAMINOPHEN 5-325 MG PO TABS: 1.0000 | ORAL_TABLET | ORAL | 0 refills | Status: AC | PRN

## 2018-02-27 MED ORDER — ONDANSETRON HCL 4 MG/2ML IJ SOLN
INTRAMUSCULAR | Status: DC | PRN
  Administered 2018-02-27: 4 mg via INTRAVENOUS

## 2018-02-27 MED ORDER — LIDOCAINE HCL (CARDIAC) PF 100 MG/5ML IV SOSY
PREFILLED_SYRINGE | INTRAVENOUS | Status: DC | PRN
  Administered 2018-02-27: 100 mg via INTRAVENOUS

## 2018-02-27 MED ORDER — FENTANYL CITRATE (PF) 100 MCG/2ML IJ SOLN
INTRAMUSCULAR | Status: DC | PRN
  Administered 2018-02-27 (×4): 25 ug via INTRAVENOUS

## 2018-02-27 MED ORDER — PROPOFOL 10 MG/ML IV BOLUS
INTRAVENOUS | Status: DC | PRN
  Administered 2018-02-27: 20 mg via INTRAVENOUS

## 2018-02-27 MED ORDER — PROPOFOL 500 MG/50ML IV EMUL
INTRAVENOUS | Status: AC
  Filled 2018-02-27: qty 50

## 2018-02-27 MED ORDER — PROPOFOL 10 MG/ML IV BOLUS
INTRAVENOUS | Status: AC
  Filled 2018-02-27: qty 20

## 2018-02-27 MED ORDER — SODIUM CHLORIDE 0.9 % IV SOLN
INTRAVENOUS | Status: DC
  Administered 2018-02-27: 09:00:00 via INTRAVENOUS

## 2018-02-27 MED ORDER — FAMOTIDINE 20 MG PO TABS
ORAL_TABLET | ORAL | Status: AC
  Administered 2018-02-27: 20 mg via ORAL
  Filled 2018-02-27: qty 1

## 2018-02-27 MED ORDER — PROPOFOL 500 MG/50ML IV EMUL
INTRAVENOUS | Status: DC | PRN
  Administered 2018-02-27: 75 ug/kg/min via INTRAVENOUS

## 2018-02-27 MED ORDER — FENTANYL CITRATE (PF) 100 MCG/2ML IJ SOLN
INTRAMUSCULAR | Status: AC
  Filled 2018-02-27: qty 2

## 2018-02-27 MED ORDER — LACTATED RINGERS IV SOLN
INTRAVENOUS | Status: DC | PRN
  Administered 2018-02-27: 10:00:00 via INTRAVENOUS

## 2018-02-27 MED ORDER — CEFAZOLIN SODIUM-DEXTROSE 2-4 GM/100ML-% IV SOLN
INTRAVENOUS | Status: AC
  Filled 2018-02-27: qty 100

## 2018-02-27 MED ORDER — BUPIVACAINE-EPINEPHRINE (PF) 0.5% -1:200000 IJ SOLN
INTRAMUSCULAR | Status: AC
  Filled 2018-02-27: qty 30

## 2018-02-27 MED ORDER — BUPIVACAINE-EPINEPHRINE 0.5% -1:200000 IJ SOLN
INTRAMUSCULAR | Status: DC | PRN
  Administered 2018-02-27: 20 mL

## 2018-02-27 MED ORDER — BUPIVACAINE-EPINEPHRINE (PF) 0.5% -1:200000 IJ SOLN
INTRAMUSCULAR | Status: AC
  Filled 2018-02-27: qty 60

## 2018-02-27 MED ORDER — FAMOTIDINE 20 MG PO TABS
20.0000 mg | ORAL_TABLET | Freq: Once | ORAL | Status: AC
  Administered 2018-02-27: 20 mg via ORAL

## 2018-02-27 SURGICAL SUPPLY — 33 items
BLADE SURG 15 STRL LF DISP TIS (BLADE) ×1 IMPLANT
BLADE SURG 15 STRL SS (BLADE) ×2
CHLORAPREP W/TINT 26ML (MISCELLANEOUS) ×3 IMPLANT
CNTNR SPEC 2.5X3XGRAD LEK (MISCELLANEOUS)
CONT SPEC 4OZ STER OR WHT (MISCELLANEOUS)
CONTAINER SPEC 2.5X3XGRAD LEK (MISCELLANEOUS) IMPLANT
COVER WAND RF STERILE (DRAPES) IMPLANT
DERMABOND ADVANCED (GAUZE/BANDAGES/DRESSINGS) ×2
DERMABOND ADVANCED .7 DNX12 (GAUZE/BANDAGES/DRESSINGS) ×1 IMPLANT
DRAPE LAPAROTOMY 100X77 ABD (DRAPES) ×3 IMPLANT
DRAPE LEGGINS SURG 28X43 STRL (DRAPES) ×3 IMPLANT
DRAPE UNDER BUTTOCK W/FLU (DRAPES) IMPLANT
ELECT REM PT RETURN 9FT ADLT (ELECTROSURGICAL) ×3
ELECTRODE REM PT RTRN 9FT ADLT (ELECTROSURGICAL) ×1 IMPLANT
GLOVE BIO SURGEON STRL SZ 6.5 (GLOVE) ×6 IMPLANT
GLOVE BIO SURGEONS STRL SZ 6.5 (GLOVE) ×3
GLOVE BIOGEL PI IND STRL 6.5 (GLOVE) ×3 IMPLANT
GLOVE BIOGEL PI INDICATOR 6.5 (GLOVE) ×6
GOWN STRL REUS W/ TWL LRG LVL3 (GOWN DISPOSABLE) ×3 IMPLANT
GOWN STRL REUS W/TWL LRG LVL3 (GOWN DISPOSABLE) ×6
KIT TURNOVER KIT A (KITS) ×3 IMPLANT
LABEL OR SOLS (LABEL) IMPLANT
MARGIN MAP 10MM (MISCELLANEOUS) IMPLANT
NEEDLE HYPO 25X1 1.5 SAFETY (NEEDLE) ×3 IMPLANT
NS IRRIG 500ML POUR BTL (IV SOLUTION) ×3 IMPLANT
PACK BASIN MINOR ARMC (MISCELLANEOUS) ×3 IMPLANT
SUT ETHILON 3-0 (SUTURE) ×3 IMPLANT
SUT MNCRL 4-0 (SUTURE) ×2
SUT MNCRL 4-0 27XMFL (SUTURE) ×1
SUT VIC AB 2-0 SH 27 (SUTURE) ×2
SUT VIC AB 2-0 SH 27XBRD (SUTURE) ×1 IMPLANT
SUTURE MNCRL 4-0 27XMF (SUTURE) ×1 IMPLANT
SYR 10ML LL (SYRINGE) ×3 IMPLANT

## 2018-02-27 NOTE — Anesthesia Preprocedure Evaluation (Signed)
Anesthesia Evaluation  Patient identified by MRN, date of birth, ID band Patient awake    Reviewed: Allergy & Precautions, H&P , NPO status , Patient's Chart, lab work & pertinent test results, reviewed documented beta blocker date and time   Airway Mallampati: II   Neck ROM: full    Dental  (+) Poor Dentition   Pulmonary shortness of breath, sleep apnea ,    Pulmonary exam normal        Cardiovascular Exercise Tolerance: Good hypertension, On Medications negative cardio ROS Normal cardiovascular exam Rhythm:regular Rate:Normal     Neuro/Psych negative neurological ROS  negative psych ROS   GI/Hepatic Neg liver ROS, hiatal hernia, GERD  Medicated,  Endo/Other  negative endocrine ROSdiabetes, Well Controlled  Renal/GU negative Renal ROS  negative genitourinary   Musculoskeletal   Abdominal   Peds  Hematology negative hematology ROS (+)   Anesthesia Other Findings Past Medical History: No date: Arthritis No date: Diabetes mellitus without complication (HCC)     Comment:  type 2 No date: GERD (gastroesophageal reflux disease) Nov 2016: Hx of blood clots     Comment:  lung No date: Hypertension No date: Shortness of breath dyspnea     Comment:  on exertion No date: Sleep apnea     Comment:  cpap Past Surgical History: No date: CARDIAC CATHETERIZATION 07/28/2015: COLONOSCOPY WITH PROPOFOL; N/A     Comment:  Procedure: COLONOSCOPY WITH PROPOFOL;  Surgeon: Lucilla Lame, MD;  Location: [place];  Service:               Endoscopy;  Laterality: N/A;  Insulin dependent diabetic               and CPAP 07/23/2015: ESOPHAGOGASTRODUODENOSCOPY (EGD) WITH PROPOFOL; N/A     Comment:  Procedure: ESOPHAGOGASTRODUODENOSCOPY (EGD) WITH               PROPOFOL;  Surgeon: Lucilla Lame, MD;  Location: [facility]               ENDOSCOPY;  Service: Endoscopy;  Laterality: N/A; No date: KNEE SURGERY; Left No date:  LAPAROSCOPIC GASTRIC RESTRICTIVE DUODENAL PROCEDURE  (DUODENAL SWITCH) 07/28/2015: POLYPECTOMY     Comment:  Procedure: POLYPECTOMY;  Surgeon: Lucilla Lame, MD;                Location[facility];  Service: Endoscopy;; No date: ROTATOR CUFF REPAIR; Right   Reproductive/Obstetrics negative OB ROS                             Anesthesia Physical Anesthesia Plan  ASA: III  Anesthesia Plan: General   Post-op Pain Management:    Induction:   PONV Risk Score and Plan:   Airway Management Planned:   Additional Equipment:   Intra-op Plan:   Post-operative Plan:   Informed Consent: I have reviewed the patients History and Physical, chart, labs and discussed the procedure including the risks, benefits and alternatives for the proposed anesthesia with the patient or authorized representative who has indicated his/her understanding and acceptance.   Dental Advisory Given  Plan Discussed with: CRNA  Anesthesia Plan Comments:         Anesthesia Quick Evaluation

## 2018-02-27 NOTE — Anesthesia Post-op Follow-up Note (Signed)
Anesthesia QCDR form completed.        

## 2018-02-27 NOTE — Transfer of Care (Signed)
Immediate Anesthesia Transfer of Care Note  Patient: Dustin Estrada  Procedure(s) Performed: EXCISION MASS LEFT LEG (N/A )  Patient Location: PACU  Anesthesia Type:MAC  Level of Consciousness: awake, alert  and oriented  Airway & Oxygen Therapy: Patient Spontanous Breathing and Patient connected to face mask oxygen  Post-op Assessment: Report given to RN and Post -op Vital signs reviewed and stable  Post vital signs: stable  Last Vitals:  Vitals Value Taken Time  BP 108/62 02/27/2018 10:54 AM  Temp 36.7 C 02/27/2018 10:54 AM  Pulse 63 02/27/2018 10:55 AM  Resp 12 02/27/2018 10:55 AM  SpO2 98 % 02/27/2018 10:55 AM  Vitals shown include unvalidated device data.  Last Pain:  Vitals:   02/27/18 1054  TempSrc:   PainSc: (P) Asleep         Complications: No apparent anesthesia complications

## 2018-02-27 NOTE — Discharge Instructions (Signed)
°  Diet: Resume home heart healthy regular diet.   Activity: Increase activity as tolerated. Light activity and walking are encouraged. Do not drive or drink alcohol if taking narcotic pain medications.  Wound care: May shower with soapy water and pat dry (do not rub incisions), but no baths or submerging incision underwater until follow-up. (no swimming)   Medications: Resume all home medications. For mild to moderate pain: acetaminophen (Tylenol) or ibuprofen (if no kidney disease). Combining Tylenol with alcohol can substantially increase your risk of causing liver disease. Narcotic pain medications, if prescribed, can be used for severe pain, though may cause nausea, constipation, and drowsiness. Do not combine Tylenol and Norco within a 6 hour period as Norco contains Tylenol. If you do not need the narcotic pain medication, you do not need to fill the prescription.  Follow up Cardiologist recommendations regarding Warfarin management. From my standpoint, there is no contraindication to start taking it today.   Call office [phone]) at any time if any questions, worsening pain, fevers/chills, bleeding, drainage from incision site, or other concerns.   AMBULATORY SURGERY  DISCHARGE INSTRUCTIONS   1) The drugs that you were given will stay in your system until tomorrow so for the next 24 hours you should not:  A) Drive an automobile B) Make any legal decisions C) Drink any alcoholic beverage   2) You may resume regular meals tomorrow.  Today it is better to start with liquids and gradually work up to solid foods.  You may eat anything you prefer, but it is better to start with liquids, then soup and crackers, and gradually work up to solid foods.   3) Please notify your doctor immediately if you have any unusual bleeding, trouble breathing, redness and pain at the surgery site, drainage, fever, or pain not relieved by medication.    4) Additional  Instructions:        Please contact your physician with any problems or Same Day Surgery at [phone], Monday through Friday 6 am to 4 pm, or [place] at [facility] number at [phone].

## 2018-02-27 NOTE — H&P (Signed)
PATIENT PROFILE: Dustin Estrada is a 73 y.o. male who presents to the Clinic for consultation at the request of Dustin Estrada for evaluation of a large left thigh skin lesion.  PCP: Dustin Ochs, MD  HISTORY OF PRESENT ILLNESS: Dustin Estrada reports having a large skin lesion on his posterior thigh since more than 15 years ago. The patient refers that the skin lesion is getting bigger with time. There is no significant pain. No prior history of infection. No pain radiation. No aggravator or improving factors. The wife had uterine cancer and his is worried that this could be or convert to cancer. Patient has not personal history of skin cancer.   PROBLEM LIST: Problem List Date Reviewed: 01/04/2018  Noted  Class 1 obesity due to excess calories without serious comorbidity with body mass index (BMI) of 30.0 to 30.9 in adult 07/26/2017  Overview  S/P bariatric surgery   Atrial fibrillation, chronic 07/26/2017  Hematoma of leg 11/02/2016  Overview  Last Assessment & Plan:  The patient had a hematoma from a mild injury supratherapeutic on heparin in the right leg. This is gradually improving, but we'll certainly take several weeks to completely improved. The fact that he has had a previous DVT on that side likely increases his venous pressure is making the recovery slower. If he can tolerate compression stockings, I think that will help the swelling and discoloration. He should elevate his legs and try to remain active. I will see him back in about a month for reassessment.   Type 2 diabetes mellitus (CMS-HCC) 07/21/2015  Overview  Last Assessment & Plan:  blood glucose control important in reducing the progression of atherosclerotic disease. Also, involved in wound healing. On appropriate medications.   Obstructive sleep apnea 06/25/2015  Gastro-esophageal reflux disease without esophagitis 04/16/2015  HTN (hypertension) 07/24/2013  Hyperlipidemia 07/24/2013    GENERAL REVIEW OF SYSTEMS:    General ROS: negative for - chills, fatigue, fever, weight gain or weight loss Allergy and Immunology ROS: negative for - hives  Hematological and Lymphatic ROS: negative for - bleeding problems or bruising, negative for palpable nodes Endocrine ROS: negative for - heat or cold intolerance, hair changes Respiratory ROS: negative for - cough, shortness of breath or wheezing Cardiovascular ROS: no chest pain or palpitations GI ROS: negative for nausea, vomiting, abdominal pain, diarrhea, constipation Musculoskeletal ROS: negative for - joint swelling or muscle pain Neurological ROS: negative for - confusion, syncope Dermatological ROS: negative for pruritus and rash. Positive for skin mass Psychiatric: negative for anxiety, depression, difficulty sleeping and memory loss  MEDICATIONS: Current Outpatient Medications  Medication Sig Dispense Refill  . atorvastatin (LIPITOR) 20 MG tablet 2  . blood glucose diagnostic test strip Use 1 each 3 (three) times daily. Use as instructed.  . calcium carbonate-vitamin D3 (CALTRATE 600+D) 600 mg(1,500mg ) -200 unit tablet Take 1 tablet by mouth once daily  . docusate (COLACE) 50 MG capsule Take 250 mg by mouth once daily  . insulin glargine U-300 conc 300 unit/mL (3 mL) InPn Inject 36 Units subcutaneously once daily  . losartan (COZAAR) 50 MG tablet Take 50 mg by mouth once.  2  . VITAMIN B COMPLEX ORAL Take by mouth.  . warfarin (COUMADIN) 5 MG tablet TAKE 1 TABLET BY MOUTH ONCE DAILY 30 tablet 3   No current facility-administered medications for this visit.   ALLERGIES: Patient has no known allergies.  PAST MEDICAL HISTORY: Past Medical History:  Diagnosis Date  . Diabetes mellitus type 2,  uncomplicated (CMS-HCC)  . Hiatal hernia  . Hyperlipidemia  . Hypertension  . Pulmonary embolism (CMS-HCC)   PAST SURGICAL HISTORY: Past Surgical History:  Procedure Laterality Date  . ARTHROSCOPY ANKLE  . duodenal switch 6/202017  . KNEE  ARTHROSCOPY  . LAPAROSCOPIC CHOLECYSTECTOMY  . REPAIR HIATAL HERNIA    FAMILY HISTORY: Family History  Problem Relation Age of Onset  . No Known Problems Mother  . Diabetes Father    SOCIAL HISTORY: Social History   Socioeconomic History  . Marital status: Married  Spouse name: Dustin Estrada  . Number of children: Not on file  . Years of education: Not on file  . Highest education level: Not on file  Occupational History  . Occupation: auctioner  Social Needs  . Financial resource strain: Patient refused  . Food insecurity:  Worry: Patient refused  Inability: Patient refused  . Transportation needs:  Medical: Patient refused  Non-medical: Patient refused  Tobacco Use  . Smoking status: Former Smoker  Packs/day: 1.00  Years: 2.00  Pack years: 2.00  . Smokeless tobacco: Never Used  . Tobacco comment: no tobacco use  Substance and Sexual Activity  . Alcohol use: No  . Drug use: No  . Sexual activity: Yes  Partners: Female  Other Topics Concern  . Not on file  Social History Narrative  . Not on file   PHYSICAL EXAM: Vitals:  01/17/18 1454  BP: 123/76  Pulse: 74  Temp: 37.6 C (99.6 F)   Body mass index is 34.6 kg/m. Weight: 100.2 kg (220 lb 14.4 oz)   GENERAL: Alert, active, oriented x3  HEENT: Pupils equal reactive to light. Extraocular movements are intact. Sclera clear. Palpebral conjunctiva normal red color.  NECK: Supple with no palpable mass and no adenopathy.  LUNGS: Sound clear with no rales rhonchi or wheezes.  HEART: Regular rhythm S1 and S2 without murmur.  ABDOMEN: Soft and depressible, nontender with no palpable mass, no hepatomegaly.   EXTREMITIES: Well-developed well-nourished symmetrical with no dependent edema. Left posterior thigh large mass > 4 cm, peduculated, large base, no erythema or secretions.   NEUROLOGICAL: Awake alert oriented, facial expression symmetrical, moving all extremities.  REVIEW OF DATA: I have reviewed the  following data today: Ancillary Orders on 12/20/2017  Component Date Value  . Prothrombin Time 12/20/2017 25.8*  . Prothrombin INR 12/20/2017 2.3  Ancillary Orders on 11/15/2017  Component Date Value  . Prothrombin Time 11/15/2017 26.9*  . Prothrombin INR 11/15/2017 2.4    ASSESSMENT: Mr. Rybarczyk is a 73 y.o. male presenting for consultation for large skin mass. The patient has a mass on the left posterior thigh that is causing anxiety of being cancerous and is getting bigger in size. Patient oriented that the mass is most likely benign but since it is increasing in size excision can be considered. Patient oriented about the procedure, benefits and risk. The patient is currently taking coumadin due to a fib. Due to the size of the mass and patient on coumadin oriented the patient that this will be a more difficult case and will need to be done in the OR for better lighting, equipment and hemostasis. Patient will need clearance by cardiologist and recommendations of managing the Coumadin. Patient refers understand and agrees with plan.   PLAN: 1. INR is normal 2. Will proceed with planned excision of left thigh soft tissue mass.    Patient verbalized understanding, all questions were answered, and were agreeable with the plan outlined above.   Herbert Pun,  MD

## 2018-02-27 NOTE — Op Note (Addendum)
Preoperative diagnosis: Left thigh soft tissue mass  Postoperative diagnosis: Left thigh soft tissue mass  Procedure: Excision of left thigh soft tissue mass.  Anesthesia: Sedation and local anesthesia  Surgeon: Dr. Windell Moment  Wound Classification: Clean  Indications:  Patient is a 73 y.o. male with a palpable left thigh mass, large, pedunculated, rubbery. Patient on anticoagulation and excision in or was indicated due to need of exposure and better hemostatic control.  Findings: 1. Palpable mass at left thigh 2. The mass measured 4.5 cm in vivo 3. Adequate hemostasis.    Description of procedure: The patient was taken to the operating room and placed lithotomy on the operating table, and after sedation was administered, the left thigh was prepped and draped in the usual sterile fashion. A time-out was completed verifying correct patient, procedure, site, positioning, and implant(s) and/or special equipment prior to beginning this procedure.  Local anesthesia was infiltrated around the mass. An elliptical skin incision incision of 3 cm was made around the palpable mass.  Flaps were raised and dissection was carried down to healthy fat tissue.    The mass was dissected from surrounding tissues using electrocautery. After removing the mass which measured 4.5 cm (longest diameter) x 3 cm, the cavity was palpated and no additional abnormalities was palpated. The specimen was submitted to pathology.  Hemostasis was achieved with electrocautery and suture ligatures of 2-0 Vicryl. The wound was closed in layers. The skin was closed with a subcuticular suture of running monocryl 4-0. Dermabond was applied.  The patient tolerated the procedure well and was taken to the postanesthesia care unit in stable condition.   Specimen: Left thigh mass  Complications: None  Estimated Blood Loss: 2 mL

## 2018-02-28 LAB — SURGICAL PATHOLOGY

## 2018-03-01 ENCOUNTER — Encounter: Payer: Self-pay | Admitting: General Surgery

## 2018-03-01 NOTE — Anesthesia Postprocedure Evaluation (Signed)
Anesthesia Post Note  Patient: Dustin Estrada  Procedure(s) Performed: EXCISION MASS LEFT LEG (N/A )  Patient location during evaluation: PACU Anesthesia Type: General Level of consciousness: awake and alert Pain management: pain level controlled Vital Signs Assessment: post-procedure vital signs reviewed and stable Respiratory status: spontaneous breathing, nonlabored ventilation, respiratory function stable and patient connected to nasal cannula oxygen Cardiovascular status: stable and blood pressure returned to baseline Postop Assessment: no apparent nausea or vomiting Anesthetic complications: no     Last Vitals:  Vitals:   02/27/18 1135 02/27/18 1205  BP: 134/60   Pulse: (!) 55 (!) 54  Resp: 16 16  Temp: 36.6 C 36.6 C  SpO2: 100% 100%    Last Pain:  Vitals:   02/28/18 0841  TempSrc:   PainSc: 0-No pain                 Molli Barrows

## 2018-09-20 DIAGNOSIS — Z9884 Bariatric surgery status: Secondary | ICD-10-CM | POA: Insufficient documentation

## 2019-03-01 DIAGNOSIS — Z86718 Personal history of other venous thrombosis and embolism: Secondary | ICD-10-CM | POA: Insufficient documentation

## 2020-06-02 ENCOUNTER — Inpatient Hospital Stay
Admission: EM | Admit: 2020-06-02 | Discharge: 2020-06-06 | DRG: 673 | Disposition: A | Discharge status: Home / Self Care | Payer: Medicare Other | Attending: Internal Medicine | Admitting: Internal Medicine

## 2020-06-02 ENCOUNTER — Other Ambulatory Visit: Payer: Self-pay

## 2020-06-02 ENCOUNTER — Emergency Department: Payer: Medicare Other

## 2020-06-02 DIAGNOSIS — R6520 Severe sepsis without septic shock: Secondary | ICD-10-CM | POA: Diagnosis present

## 2020-06-02 DIAGNOSIS — N492 Inflammatory disorders of scrotum: Secondary | ICD-10-CM | POA: Diagnosis present

## 2020-06-02 DIAGNOSIS — Y732 Prosthetic and other implants, materials and accessory gastroenterology and urology devices associated with adverse incidents: Secondary | ICD-10-CM | POA: Diagnosis present

## 2020-06-02 DIAGNOSIS — Z7901 Long term (current) use of anticoagulants: Secondary | ICD-10-CM | POA: Diagnosis not present

## 2020-06-02 DIAGNOSIS — Z79899 Other long term (current) drug therapy: Secondary | ICD-10-CM

## 2020-06-02 DIAGNOSIS — M1990 Unspecified osteoarthritis, unspecified site: Secondary | ICD-10-CM | POA: Diagnosis present

## 2020-06-02 DIAGNOSIS — I482 Chronic atrial fibrillation, unspecified: Secondary | ICD-10-CM | POA: Diagnosis present

## 2020-06-02 DIAGNOSIS — Z86718 Personal history of other venous thrombosis and embolism: Secondary | ICD-10-CM

## 2020-06-02 DIAGNOSIS — Z20822 Contact with and (suspected) exposure to covid-19: Secondary | ICD-10-CM | POA: Diagnosis present

## 2020-06-02 DIAGNOSIS — E785 Hyperlipidemia, unspecified: Secondary | ICD-10-CM | POA: Diagnosis present

## 2020-06-02 DIAGNOSIS — E669 Obesity, unspecified: Secondary | ICD-10-CM | POA: Diagnosis present

## 2020-06-02 DIAGNOSIS — T8361XA Infection and inflammatory reaction due to implanted penile prosthesis, initial encounter: Principal | ICD-10-CM | POA: Diagnosis present

## 2020-06-02 DIAGNOSIS — R7401 Elevation of levels of liver transaminase levels: Secondary | ICD-10-CM | POA: Diagnosis present

## 2020-06-02 DIAGNOSIS — N368 Other specified disorders of urethra: Secondary | ICD-10-CM | POA: Diagnosis present

## 2020-06-02 DIAGNOSIS — Z6832 Body mass index (BMI) 32.0-32.9, adult: Secondary | ICD-10-CM

## 2020-06-02 DIAGNOSIS — G4730 Sleep apnea, unspecified: Secondary | ICD-10-CM | POA: Diagnosis present

## 2020-06-02 DIAGNOSIS — Z794 Long term (current) use of insulin: Secondary | ICD-10-CM

## 2020-06-02 DIAGNOSIS — B9689 Other specified bacterial agents as the cause of diseases classified elsewhere: Secondary | ICD-10-CM | POA: Diagnosis present

## 2020-06-02 DIAGNOSIS — E860 Dehydration: Secondary | ICD-10-CM | POA: Diagnosis present

## 2020-06-02 DIAGNOSIS — I10 Essential (primary) hypertension: Secondary | ICD-10-CM | POA: Diagnosis present

## 2020-06-02 DIAGNOSIS — N179 Acute kidney failure, unspecified: Secondary | ICD-10-CM | POA: Diagnosis present

## 2020-06-02 DIAGNOSIS — T83420A Displacement of penile (implanted) prosthesis, initial encounter: Secondary | ICD-10-CM | POA: Diagnosis present

## 2020-06-02 DIAGNOSIS — R8281 Pyuria: Secondary | ICD-10-CM | POA: Diagnosis present

## 2020-06-02 DIAGNOSIS — Z833 Family history of diabetes mellitus: Secondary | ICD-10-CM

## 2020-06-02 DIAGNOSIS — K219 Gastro-esophageal reflux disease without esophagitis: Secondary | ICD-10-CM | POA: Diagnosis present

## 2020-06-02 DIAGNOSIS — E1165 Type 2 diabetes mellitus with hyperglycemia: Secondary | ICD-10-CM | POA: Diagnosis present

## 2020-06-02 DIAGNOSIS — Z8249 Family history of ischemic heart disease and other diseases of the circulatory system: Secondary | ICD-10-CM

## 2020-06-02 LAB — CBC WITH DIFFERENTIAL/PLATELET
Abs Immature Granulocytes: 0.07 10*3/uL (ref 0.00–0.07)
Basophils Absolute: 0 10*3/uL (ref 0.0–0.1)
Basophils Relative: 0 %
Eosinophils Absolute: 0 10*3/uL (ref 0.0–0.5)
Eosinophils Relative: 0 %
HCT: 40 % (ref 39.0–52.0)
Hemoglobin: 13.5 g/dL (ref 13.0–17.0)
Immature Granulocytes: 1 %
Lymphocytes Relative: 4 %
Lymphs Abs: 0.6 10*3/uL — ABNORMAL LOW (ref 0.7–4.0)
MCH: 31.9 pg (ref 26.0–34.0)
MCHC: 33.8 g/dL (ref 30.0–36.0)
MCV: 94.6 fL (ref 80.0–100.0)
Monocytes Absolute: 1.2 10*3/uL — ABNORMAL HIGH (ref 0.1–1.0)
Monocytes Relative: 9 %
Neutro Abs: 11.2 10*3/uL — ABNORMAL HIGH (ref 1.7–7.7)
Neutrophils Relative %: 86 %
Platelets: 207 10*3/uL (ref 150–400)
RBC: 4.23 MIL/uL (ref 4.22–5.81)
RDW: 12.4 % (ref 11.5–15.5)
WBC: 13.1 10*3/uL — ABNORMAL HIGH (ref 4.0–10.5)
nRBC: 0 % (ref 0.0–0.2)

## 2020-06-02 LAB — COMPREHENSIVE METABOLIC PANEL
ALT: 52 U/L — ABNORMAL HIGH (ref 0–44)
AST: 61 U/L — ABNORMAL HIGH (ref 15–41)
Albumin: 3.6 g/dL (ref 3.5–5.0)
Alkaline Phosphatase: 67 U/L (ref 38–126)
Anion gap: 13 (ref 5–15)
BUN: 21 mg/dL (ref 8–23)
CO2: 22 mmol/L (ref 22–32)
Calcium: 9 mg/dL (ref 8.9–10.3)
Chloride: 99 mmol/L (ref 98–111)
Creatinine, Ser: 1.31 mg/dL — ABNORMAL HIGH (ref 0.61–1.24)
GFR, Estimated: 58 mL/min — ABNORMAL LOW (ref >60)
Glucose, Bld: 217 mg/dL — ABNORMAL HIGH (ref 70–99)
Potassium: 3.8 mmol/L (ref 3.5–5.1)
Sodium: 134 mmol/L — ABNORMAL LOW (ref 135–145)
Total Bilirubin: 1.8 mg/dL — ABNORMAL HIGH (ref 0.3–1.2)
Total Protein: 7.6 g/dL (ref 6.5–8.1)

## 2020-06-02 LAB — RESP PANEL BY RT-PCR (FLU A&B, COVID) ARPGX2
Influenza A by PCR: NEGATIVE
Influenza B by PCR: NEGATIVE
SARS Coronavirus 2 by RT PCR: NEGATIVE

## 2020-06-02 LAB — URINALYSIS, COMPLETE (UACMP) WITH MICROSCOPIC
Bacteria, UA: NONE SEEN
Bilirubin Urine: NEGATIVE
Glucose, UA: 50 mg/dL — AB
Ketones, ur: 20 mg/dL — AB
Nitrite: NEGATIVE
Protein, ur: 30 mg/dL — AB
Specific Gravity, Urine: >1.046 — ABNORMAL HIGH (ref 1.005–1.030)
WBC, UA: >50 WBC/hpf — ABNORMAL HIGH (ref 0–5)
pH: 5 (ref 5.0–8.0)

## 2020-06-02 LAB — LACTIC ACID, PLASMA: Lactic Acid, Venous: 1.9 mmol/L (ref 0.5–1.9)

## 2020-06-02 LAB — GLUCOSE, CAPILLARY: Glucose-Capillary: 161 mg/dL — ABNORMAL HIGH (ref 70–99)

## 2020-06-02 LAB — CBG MONITORING, ED
Glucose-Capillary: 178 mg/dL — ABNORMAL HIGH (ref 70–99)
Glucose-Capillary: 215 mg/dL — ABNORMAL HIGH (ref 70–99)

## 2020-06-02 MED ORDER — SENNA 8.6 MG PO TABS
1.0000 | ORAL_TABLET | Freq: Every day | ORAL | Status: DC
  Administered 2020-06-02 – 2020-06-03 (×2): 8.6 mg via ORAL
  Filled 2020-06-02 (×4): qty 1

## 2020-06-02 MED ORDER — INSULIN ASPART 100 UNIT/ML ~~LOC~~ SOLN
0.0000 [IU] | Freq: Every day | SUBCUTANEOUS | Status: DC
  Administered 2020-06-03: 22:00:00 4 [IU] via SUBCUTANEOUS
  Administered 2020-06-04: 2 [IU] via SUBCUTANEOUS
  Filled 2020-06-02 (×2): qty 1

## 2020-06-02 MED ORDER — IOHEXOL 300 MG/ML  SOLN
100.0000 mL | Freq: Once | INTRAMUSCULAR | Status: AC | PRN
  Administered 2020-06-02: 100 mL via INTRAVENOUS

## 2020-06-02 MED ORDER — VANCOMYCIN HCL IN DEXTROSE 1-5 GM/200ML-% IV SOLN
1000.0000 mg | Freq: Once | INTRAVENOUS | Status: AC
  Administered 2020-06-02: 1000 mg via INTRAVENOUS
  Filled 2020-06-02: qty 200

## 2020-06-02 MED ORDER — PHYTONADIONE 5 MG PO TABS
5.0000 mg | ORAL_TABLET | Freq: Every day | ORAL | Status: DC
  Administered 2020-06-02: 5 mg via ORAL
  Filled 2020-06-02 (×3): qty 1

## 2020-06-02 MED ORDER — SODIUM CHLORIDE 0.9 % IV SOLN
2.0000 g | Freq: Once | INTRAVENOUS | Status: AC
  Administered 2020-06-02: 2 g via INTRAVENOUS
  Filled 2020-06-02: qty 2

## 2020-06-02 MED ORDER — PIPERACILLIN-TAZOBACTAM 3.375 G IVPB
3.3750 g | Freq: Three times a day (TID) | INTRAVENOUS | Status: DC
  Administered 2020-06-02 – 2020-06-03 (×3): 3.375 g via INTRAVENOUS
  Filled 2020-06-02 (×5): qty 50

## 2020-06-02 MED ORDER — INSULIN ASPART 100 UNIT/ML ~~LOC~~ SOLN
0.0000 [IU] | Freq: Three times a day (TID) | SUBCUTANEOUS | Status: DC
  Administered 2020-06-02 – 2020-06-04 (×5): 3 [IU] via SUBCUTANEOUS
  Administered 2020-06-05 – 2020-06-06 (×5): 2 [IU] via SUBCUTANEOUS
  Filled 2020-06-02 (×10): qty 1

## 2020-06-02 MED ORDER — OXYCODONE HCL 5 MG PO TABS
5.0000 mg | ORAL_TABLET | ORAL | Status: DC | PRN
  Administered 2020-06-04 – 2020-06-06 (×6): 5 mg via ORAL
  Filled 2020-06-02 (×7): qty 1

## 2020-06-02 MED ORDER — HYDROMORPHONE HCL 1 MG/ML IJ SOLN
0.5000 mg | INTRAMUSCULAR | Status: DC | PRN
  Filled 2020-06-02: qty 1

## 2020-06-02 MED ORDER — ENOXAPARIN SODIUM 60 MG/0.6ML ~~LOC~~ SOLN: 0.5000 mg/kg | SUBCUTANEOUS | Status: DC

## 2020-06-02 MED ORDER — MELATONIN 5 MG PO TABS
5.0000 mg | ORAL_TABLET | Freq: Every evening | ORAL | Status: DC | PRN
  Administered 2020-06-02 – 2020-06-05 (×4): 5 mg via ORAL
  Filled 2020-06-02 (×5): qty 1

## 2020-06-02 MED ORDER — ACETAMINOPHEN 325 MG PO TABS
650.0000 mg | ORAL_TABLET | Freq: Four times a day (QID) | ORAL | Status: DC | PRN
  Administered 2020-06-02 – 2020-06-03 (×2): 650 mg via ORAL
  Filled 2020-06-02 (×2): qty 2

## 2020-06-02 MED ORDER — ENOXAPARIN SODIUM 40 MG/0.4ML ~~LOC~~ SOLN: 40.0000 mg | SUBCUTANEOUS | Status: DC

## 2020-06-02 MED ORDER — SODIUM CHLORIDE 0.9 % IV SOLN: INTRAVENOUS | Status: AC

## 2020-06-02 MED ORDER — VANCOMYCIN HCL IN DEXTROSE 1-5 GM/200ML-% IV SOLN
1000.0000 mg | INTRAVENOUS | Status: DC
  Administered 2020-06-02: 1000 mg via INTRAVENOUS
  Filled 2020-06-02 (×2): qty 200

## 2020-06-02 NOTE — H&P (Incomplete)
History and Physical  75 y.o. male with medical history DOB: 03/02/1945 DOA: 06/02/2020  Referring physician: Dr. Corky Downs, [place] PCP: [name], MD  Outpatient Specialists: Urology. Patient coming from: Home.  Chief Complaint: Scrotal swelling with discharge.  HPI: Dustin Estrada is a 75 y.o. male with medical history significant for penile implant, type 2 diabetes, essential hypertension, chronic A. fib on Coumadin who presented to [facility] due to worsening scrotal swelling and discharge.  Onset on Thursday afternoon, 5 days prior to presentation, noted his scrotum most tender and edematous with erythema.  On Friday and Saturday his underwear was soaked with discharge.  Had a fever of 102 on Saturday and on Sunday the fever of 101.  He did not want to come to the ED however his wife convinced him to come.  CT abdomen and pelvis with contrast in the ED revealed 2 complex fluid collection in the scrotum which are separate from the testes and surrounding the reservoir and tubing for the patient's penile prosthesis.  Was started on IV antibiotics empirically.  Urology was consulted by EDP.  TRH, hospitalist team, was asked to admit.  ED Course: T-max 100.1 in the ED.  BP 138/66, pulse 73, respiratory 17, O2 saturation 98% on room air.  Lab studies remarkable for WBC 13,000, serum sodium 134, serum glucose 217, creatinine 1.3, AST 61, ALT 62, total bilirubin 1.8.  Lactic acid 1.9.  Review of Systems: Review of systems as noted in the HPI. All other systems reviewed and are negative.   Past Medical History:  Diagnosis Date  . Arthritis   . Diabetes mellitus without complication ([place])    type 2  . GERD (gastroesophageal reflux disease)   . Hx of blood clots Nov 2016   lung  . Hypertension   . Shortness of breath dyspnea    on exertion  . Sleep apnea    cpap   Past Surgical History:  Procedure Laterality Date  . CARDIAC CATHETERIZATION    . COLONOSCOPY WITH PROPOFOL N/A 07/28/2015   Procedure:  COLONOSCOPY WITH PROPOFOL;  Surgeon: Lucilla Lame, MD;  Location: [facility];  Service: Endoscopy;  Laterality: N/A;  Insulin dependent diabetic and CPAP  . ESOPHAGOGASTRODUODENOSCOPY (EGD) WITH PROPOFOL N/A 07/23/2015   Procedure: ESOPHAGOGASTRODUODENOSCOPY (EGD) WITH PROPOFOL;  Surgeon: Lucilla Lame, MD;  Location: [facility];  Service: Endoscopy;  Laterality: N/A;  . KNEE SURGERY Left   . 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 MASS EXCISION N/A 02/27/2018   Procedure: EXCISION MASS LEFT LEG;  Surgeon: Herbert Pun, MD;  Location: [facility] ORS;  Service: General;  Laterality: N/A;  . POLYPECTOMY  07/28/2015   Procedure: POLYPECTOMY;  Surgeon: Lucilla Lame, MD;  Location: [facility];  Service: Endoscopy;;  . ROTATOR CUFF REPAIR Right     Social History:  reports that he has never smoked. He has never used smokeless tobacco. He reports that he does not drink alcohol and does not use drugs.   No Known Allergies  Family History  Problem Relation Age of Onset  . Diabetes Father   . Heart attack Maternal Uncle   . Heart attack Maternal Grandfather       Prior to Admission medications   Medication Sig Start Date End Date Taking? Authorizing Provider  atorvastatin (LIPITOR) 20 MG tablet Take 20 mg by mouth every evening.  09/20/16  Yes [provider]  b complex vitamins tablet Take 1 tablet by mouth daily.   Yes [provider]  Calcium Carb-Cholecalciferol (CALCIUM 600+D3 PO) Take 1 tablet by mouth daily.   Yes [provider]  docusate sodium (COLACE) 100 MG capsule Take by mouth.   Yes [provider]  insulin lispro (HUMALOG) 100 UNIT/ML KwikPen Inject 15 Units into the skin 2 (two) times daily before lunch and supper. 08/28/19  Yes [provider]  losartan (COZAAR) 50 MG tablet Take 50 mg by mouth every evening.    Yes [provider]  Multiple Vitamins-Minerals (MULTIVITAMIN  PO) Take 2 tablets by mouth daily.   Yes [provider]  TOUJEO SOLOSTAR 300 UNIT/ML SOPN Inject 30 Units into the skin every evening.   Yes [provider]  warfarin (COUMADIN) 5 MG tablet Take 2.5-5 mg by mouth See admin instructions. Take 5 mg by mouth daily on Wednesday. Take 2.5 mg by mouth daily on all other days.   Yes [provider]    Physical Exam: BP 138/66   Pulse 73   Temp 100.1 F (37.8 C) (Oral)   Resp 17   Ht 5\' 8"  (1.727 m)   Wt 97.5 kg   SpO2 98%   BMI 32.69 kg/m   . General: 75 y.o. year-old male well developed well nourished in no acute distress.  Alert and oriented x3. . Cardiovascular: Regular rate and rhythm with no rubs or gallops.  No thyromegaly or JVD noted.  No lower extremity edema. 2/4 pulses in all 4 extremities. Marland Kitchen Respiratory: Clear to auscultation with no wheezes or rales. Good inspiratory effort. . Abdomen: Soft nontender nondistended with normal bowel sounds x4 quadrants. . Muskuloskeletal: No cyanosis, clubbing or edema noted bilaterally . Neuro: CN II-XII intact, strength, sensation, reflexes . Skin: Scrotum erythematous with purulent discharge noted . Psychiatry: Judgement and insight appear normal. Mood is appropriate for condition and setting          Labs on Admission:  Basic Metabolic Panel: Recent Labs  Lab 06/02/20 0910  NA 134*  K 3.8  CL 99  CO2 22  GLUCOSE 217*  BUN 21  CREATININE 1.31*  CALCIUM 9.0   Liver Function Tests: Recent Labs  Lab 06/02/20 0910  AST 61*  ALT 52*  ALKPHOS 67  BILITOT 1.8*  PROT 7.6  ALBUMIN 3.6   No results for input(s): LIPASE, AMYLASE in the last 168 hours. No results for input(s): AMMONIA in the last 168 hours. CBC: Recent Labs  Lab 06/02/20 0910  WBC 13.1*  NEUTROABS 11.2*  HGB 13.5  HCT 40.0  MCV 94.6  PLT 207   Cardiac Enzymes: No results for input(s): CKTOTAL, CKMB, CKMBINDEX, TROPONINI in the last 168 hours.  BNP (last 3 results) No results  for input(s): BNP in the last 8760 hours.  ProBNP (last 3 results) No results for input(s): PROBNP in the last 8760 hours.  CBG: Recent Labs  Lab 06/02/20 1202 06/02/20 1707 06/02/20 2152  GLUCAP 178* 215* 161*    Radiological Exams on Admission: CT ABDOMEN PELVIS W CONTRAST  Result Date: 06/02/2020 CLINICAL DATA:  Scrotal swelling for 4 days. History of penile implant times 10 years with abnormal scrotal ultrasound. Abdominal abscess/infection suspected. History of cholecystectomy, hiatal hernia and gastric surgeries in 2017. EXAM: CT ABDOMEN AND PELVIS WITH CONTRAST TECHNIQUE: Multidetector CT imaging of the abdomen and pelvis was performed using the standard protocol following bolus administration of intravenous contrast. CONTRAST:  157mL OMNIPAQUE IOHEXOL 300 MG/ML  SOLN COMPARISON:  Scrotal ultrasound same date. FINDINGS: Lower chest: Clear lung bases. No  significant pleural or pericardial effusion. Hepatobiliary: The liver is normal in density without suspicious focal abnormality. No significant biliary dilatation post cholecystectomy. Pancreas: Unremarkable. No pancreatic ductal dilatation or surrounding inflammatory changes. Spleen: Normal in size without focal abnormality. Adrenals/Urinary Tract: Both adrenal glands appear normal. There are small nonobstructing bilateral renal calculi with mild nonspecific symmetric perinephric soft tissue stranding bilaterally. No evidence of ureteral calculus or hydronephrosis. There is a small cyst in the interpolar region of the right kidney. The bladder appears unremarkable. Stomach/Bowel: No enteric contrast administered. There are postsurgical changes in the stomach consistent with partial gastrectomy and gastric bypass procedure. No evidence of bowel wall thickening, distention or surrounding inflammation. The appendix appears normal. There are mild diverticular changes in the sigmoid colon. Vascular/Lymphatic: There are no enlarged abdominal or  pelvic lymph nodes. Mild aortic and branch vessel atherosclerosis. No acute vascular findings. Reproductive: The prostate gland is mildly enlarged. Patient has a penile prosthesis, the reservoir for which appears to be located posteriorly in the scrotum. The reservoir appears decompressed with a surrounding fluid collection measuring 8.1 x 6.0 x 6.2 cm. This collection demonstrates internal air bubbles and peripheral enhancement, suspicious for an infected fluid collection. There is an additional complex fluid collection more anteriorly in the scrotum surrounding the tubing of the penile prosthesis, measuring 7.9 x 6.2 x 9.3 cm. This also demonstrates internal air bubbles and peripheral enhancement. Separate from these collections are small bilateral hydroceles as seen on preceding ultrasound. No inflammatory changes identified in the pelvis. Other: Subcutaneous edema in the anterior abdominal wall, likely related to subcutaneous injections. Small left inguinal hernia containing only fat. No ascites. Musculoskeletal: No acute or significant osseous findings. Multilevel lumbar spondylosis. Bilateral L5 pars defects with grade 1 anterolisthesis and moderate foraminal narrowing bilaterally at L5-S1. Probable hemangioma in the L1 vertebral body. IMPRESSION: 1. There are 2 complex fluid collections in the scrotum which are separate from the testes and surrounding the reservoir and tubing for the patient's penile prosthesis. Both of these collections demonstrate internal air bubbles and rim enhancement suspected for infected fluid. Urology consultation recommended. 2. No evidence of intrapelvic or generalized peritoneal inflammation. 3. Nonobstructing bilateral renal calculi. 4. Bilateral L5 pars defects with resulting grade 1 anterolisthesis and moderate biforaminal stenosis at L5-S1. 5.  Aortic Atherosclerosis (ICD10-I70.0). Electronically Signed   By: Richardean Sale M.D.   On: 06/02/2020 11:45   US SCROTUM  W/DOPPLER  Result Date: 06/02/2020 CLINICAL DATA:  Scrotal infection. EXAM: SCROTAL ULTRASOUND DOPPLER ULTRASOUND OF THE TESTICLES TECHNIQUE: Complete ultrasound examination of the testicles, epididymis, and other scrotal structures was performed. Color and spectral Doppler ultrasound were also utilized to evaluate blood flow to the testicles. COMPARISON:  None. FINDINGS: Right testicle Measurements: 53 x 20 x 30 mm.  No mass or abnormal vascularity Left testicle Measurements:  45 x 26 x 32 mm.  No mass or abnormal vascularity Right epididymis:  Normal in size and appearance. Left epididymis:  Normal in size and appearance. Hydrocele:  Simple hydrocele. Varicocele:  None visualized. Pulsed Doppler interrogation of both testes demonstrates normal low resistance arterial and venous waveforms bilaterally. Other: Penile implant with mid level echoes encapsulated around the tubing in the perineum. Gas is also present and likely within the implant reservoir or, CT would better define the extent and location of fluid/gas in and out of the implant. IMPRESSION: 1. Fluid collection around penile implant hardware, recommend CT with contrast. 2. Negative testicles. Electronically Signed   By: Neva Seat.D.  On: 06/02/2020 10:42    EKG: I independently viewed the EKG done and my findings are as followed: None at the time of this visit.  Assessment/Plan Present on Admission: . Scrotal infection  Active Problems:   Scrotal infection  Sepsis secondary to scrotal infection with concern for infected penile prosthesis Presented with worsening scrotal edema, erythema, purulent discharge, fevers at home with T-max 102. Presented with leukocytosis with WBC 13,000. CT abdomen pelvis with concern for infected penile prosthesis. Started on IV antibiotics empirically. Obtain superficial fluids fromdischarge  DVT prophylaxis: ***   Code Status: ***   Family Communication: ***   Disposition Plan: ***    Consults called: ***   Admission status: ***    Status is: Inpatient  {Inpatient:23812}  Dispo:  Patient From: Home  Planned Disposition: Home  Medically stable for discharge: No         Kayleen Memos MD [facility] Pager [phone]  If 7PM-7AM, please contact night-coverage [email or web address] Password [email or web address]  06/02/2020, 11:56 PM

## 2020-06-02 NOTE — H&P (View-Only) (Signed)
Urology Consult  Requesting physician: Dr. Corky Downs  Reason for consultation: Scrotal infection with penile prosthesis  Chief Complaint: Scrotal pain and swelling  History of Present Illness: Dustin Estrada is a 75 y.o. male who presented to the ED this morning with a 3-4-day history of scrotal discomfort and swelling and a purulent penile discharge.   Intermittent fever to 101 48 hours ago  Mild body aches and malaise  s/p placement of a 3 piece inflatable penile prosthesis by Dr. Yves Dill 2014 via a penoscrotal approach  + Diabetes  Scrotal sonogram initially performed which showed normal-appearing testes bilaterally and fluid collection surrounding penile prosthesis component  CT abdomen pelvis with contrast remarkable for a posterior scrotal location of the reservoir with a surrounding fluid collection measuring up to 8 cm and rim enhancement  A second complex fluid collection noted more anteriorly surrounding the scrotal pump measuring up to 9.3 cm with rim enhancement  He states the prosthesis never really worked and he has not attempted to use in many years   Past Medical History:  Diagnosis Date  . Arthritis   . Diabetes mellitus without complication ([place])    type 2  . GERD (gastroesophageal reflux disease)   . Hx of blood clots Nov 2016   lung  . Hypertension   . Shortness of breath dyspnea    on exertion  . Sleep apnea    cpap    Past Surgical History:  Procedure Laterality Date  . CARDIAC CATHETERIZATION    . COLONOSCOPY WITH PROPOFOL N/A 07/28/2015   Procedure: COLONOSCOPY WITH PROPOFOL;  Surgeon: Lucilla Lame, MD;  Location: [facility];  Service: Endoscopy;  Laterality: N/A;  Insulin dependent diabetic and CPAP  . ESOPHAGOGASTRODUODENOSCOPY (EGD) WITH PROPOFOL N/A 07/23/2015   Procedure: ESOPHAGOGASTRODUODENOSCOPY (EGD) WITH PROPOFOL;  Surgeon: Lucilla Lame, MD;  Location: [facility];  Service: Endoscopy;  Laterality: N/A;  . KNEE SURGERY Left    . 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 MASS EXCISION N/A 02/27/2018   Procedure: EXCISION MASS LEFT LEG;  Surgeon: Herbert Pun, MD;  Location: [facility];  Service: General;  Laterality: N/A;  . POLYPECTOMY  07/28/2015   Procedure: POLYPECTOMY;  Surgeon: Lucilla Lame, MD;  Location: [facility];  Service: Endoscopy;;  . ROTATOR CUFF REPAIR Right     Home Medications:  Current Meds  Medication Sig  . atorvastatin (LIPITOR) 20 MG tablet Take 20 mg by mouth every evening.   Marland Kitchen b complex vitamins tablet Take 1 tablet by mouth daily.  . Calcium Carb-Cholecalciferol (CALCIUM 600+D3 PO) Take 1 tablet by mouth daily.  Marland Kitchen docusate sodium (COLACE) 100 MG capsule Take by mouth.  . insulin lispro (HUMALOG) 100 UNIT/ML KwikPen Inject 15 Units into the skin 2 (two) times daily before lunch and supper.  . losartan (COZAAR) 50 MG tablet Take 50 mg by mouth every evening.   . Multiple Vitamins-Minerals (MULTIVITAMIN PO) Take 2 tablets by mouth daily.  Nelva Nay SOLOSTAR 300 UNIT/ML SOPN Inject 30 Units into the skin every evening.  . warfarin (COUMADIN) 5 MG tablet Take 2.5-5 mg by mouth See admin instructions. Take 5 mg by mouth daily on Wednesday. Take 2.5 mg by mouth daily on all other days.    Allergies: No Known Allergies  Family History  Problem Relation Age of Onset  . Diabetes Father   . Heart attack Maternal Uncle   . Heart attack Maternal Grandfather     Social History:  reports that he has never smoked. He has never used smokeless tobacco. He reports that he does not drink alcohol and does not use drugs.  ROS: A complete review of systems was performed.  All systems are negative except for pertinent findings as noted.  Physical Exam:  Vital signs in last 24 hours: Temp:  [97.5 F (36.4 C)-100 F (37.8 C)] 100 F (37.8 C) (04/18 1743) Pulse Rate:  [71-110] 110 (04/18 1743) Resp:  [10-18] 17 (04/18 1743) BP: (121-175)/(60-147)  175/147 (04/18 1743) SpO2:  [97 %-100 %] 100 % (04/18 1743) Weight:  [97.5 kg] 97.5 kg (04/18 0857) Constitutional:  Alert, No acute distress, not ill-appearing HEENT: [place] AT, moist mucus membranes.  Trachea midline, no masses Cardiovascular: Regular rate and rhythm, no clubbing, cyanosis, or edema. Respiratory: Normal respiratory effort, lungs clear bilaterally GI: Abdomen is soft, nontender, nondistended, no abdominal masses GU: Phallus retracted with purulent discharge.  Enlarged erythematous scrotum without necrosis or crepitus.  Minimal tenderness Skin: No rashes, bruises or suspicious lesions Lymph: No cervical or inguinal adenopathy Neurologic: Grossly intact, no focal deficits, moving all 4 extremities Psychiatric: Normal mood and affect   Laboratory Data:  Recent Labs    06/02/20 0910  WBC 13.1*  HGB 13.5  HCT 40.0   Recent Labs    06/02/20 0910  NA 134*  K 3.8  CL 99  CO2 22  GLUCOSE 217*  BUN 21  CREATININE 1.31*  CALCIUM 9.0   No results for input(s): LABPT, INR in the last 72 hours. No results for input(s): LABURIN in the last 72 hours. Results for orders placed or performed during the hospital encounter of 06/02/20  Resp Panel by RT-PCR (Flu A&B, Covid) Nasopharyngeal Swab     Status: None   Collection Time: 06/02/20 12:12 PM   Specimen: Nasopharyngeal Swab; Nasopharyngeal(NP) swabs in vial transport medium  Result Value Ref Range Status   SARS Coronavirus 2 by RT PCR NEGATIVE NEGATIVE Final    Comment: (NOTE) SARS-CoV-2 target nucleic acids are NOT DETECTED.  The SARS-CoV-2 RNA is generally detectable in upper respiratory specimens during the acute phase of infection. The lowest concentration of SARS-CoV-2 viral copies this assay can detect is 138 copies/mL. A negative result does not preclude SARS-Cov-2 infection and should not be used as the sole basis for treatment or other patient management decisions. A negative result may occur with  improper  specimen collection/handling, submission of specimen other than nasopharyngeal swab, presence of viral mutation(s) within the areas targeted by this assay, and inadequate number of viral copies(<138 copies/mL). A negative result must be combined with clinical observations, patient history, and epidemiological information. The expected result is Negative.  Fact Sheet for Patients:  EntrepreneurPulse.com.au  Fact Sheet for Healthcare Providers:  IncredibleEmployment.be  This test is no t yet approved or cleared by the [place] FDA and  has been authorized for detection and/or diagnosis of SARS-CoV-2 by FDA under an Emergency Use Authorization (EUA). This EUA will remain  in effect (meaning this test can be used) for the duration of the COVID-19 declaration under Section 564(b)(1) of the Act, 21 U.S.C.section 360bbb-3(b)(1), unless the authorization is terminated  or revoked sooner.       Influenza A by PCR NEGATIVE NEGATIVE Final   Influenza B by PCR NEGATIVE NEGATIVE Final    Comment: (NOTE) The Xpert Xpress SARS-CoV-2/FLU/RSV plus assay is intended as an aid in the diagnosis of influenza from Nasopharyngeal swab specimens and should not be used as a sole  basis for treatment. Nasal washings and aspirates are unacceptable for Xpert Xpress SARS-CoV-2/FLU/RSV testing.  Fact Sheet for Patients: EntrepreneurPulse.com.au  Fact Sheet for Healthcare Providers: IncredibleEmployment.be  This test is not yet approved or cleared by the [place] FDA and has been authorized for detection and/or diagnosis of SARS-CoV-2 by FDA under an Emergency Use Authorization (EUA). This EUA will remain in effect (meaning this test can be used) for the duration of the COVID-19 declaration under Section 564(b)(1) of the Act, 21 U.S.C. section 360bbb-3(b)(1), unless the authorization is terminated or revoked.  Performed at  [facility], [address], [place], [place] [address]      Radiologic Imaging: CT images were personally reviewed and interpreted  CT ABDOMEN PELVIS W CONTRAST  Result Date: 06/02/2020 CLINICAL DATA:  Scrotal swelling for 4 days. History of penile implant times 10 years with abnormal scrotal ultrasound. Abdominal abscess/infection suspected. History of cholecystectomy, hiatal hernia and gastric surgeries in 2017. EXAM: CT ABDOMEN AND PELVIS WITH CONTRAST TECHNIQUE: Multidetector CT imaging of the abdomen and pelvis was performed using the standard protocol following bolus administration of intravenous contrast. CONTRAST:  122mL OMNIPAQUE IOHEXOL 300 MG/ML  SOLN COMPARISON:  Scrotal ultrasound same date. FINDINGS: Lower chest: Clear lung bases. No significant pleural or pericardial effusion. Hepatobiliary: The liver is normal in density without suspicious focal abnormality. No significant biliary dilatation post cholecystectomy. Pancreas: Unremarkable. No pancreatic ductal dilatation or surrounding inflammatory changes. Spleen: Normal in size without focal abnormality. Adrenals/Urinary Tract: Both adrenal glands appear normal. There are small nonobstructing bilateral renal calculi with mild nonspecific symmetric perinephric soft tissue stranding bilaterally. No evidence of ureteral calculus or hydronephrosis. There is a small cyst in the interpolar region of the right kidney. The bladder appears unremarkable. Stomach/Bowel: No enteric contrast administered. There are postsurgical changes in the stomach consistent with partial gastrectomy and gastric bypass procedure. No evidence of bowel wall thickening, distention or surrounding inflammation. The appendix appears normal. There are mild diverticular changes in the sigmoid colon. Vascular/Lymphatic: There are no enlarged abdominal or pelvic lymph nodes. Mild aortic and branch vessel atherosclerosis. No acute vascular findings. Reproductive: The  prostate gland is mildly enlarged. Patient has a penile prosthesis, the reservoir for which appears to be located posteriorly in the scrotum. The reservoir appears decompressed with a surrounding fluid collection measuring 8.1 x 6.0 x 6.2 cm. This collection demonstrates internal air bubbles and peripheral enhancement, suspicious for an infected fluid collection. There is an additional complex fluid collection more anteriorly in the scrotum surrounding the tubing of the penile prosthesis, measuring 7.9 x 6.2 x 9.3 cm. This also demonstrates internal air bubbles and peripheral enhancement. Separate from these collections are small bilateral hydroceles as seen on preceding ultrasound. No inflammatory changes identified in the pelvis. Other: Subcutaneous edema in the anterior abdominal wall, likely related to subcutaneous injections. Small left inguinal hernia containing only fat. No ascites. Musculoskeletal: No acute or significant osseous findings. Multilevel lumbar spondylosis. Bilateral L5 pars defects with grade 1 anterolisthesis and moderate foraminal narrowing bilaterally at L5-S1. Probable hemangioma in the L1 vertebral body. IMPRESSION: 1. There are 2 complex fluid collections in the scrotum which are separate from the testes and surrounding the reservoir and tubing for the patient's penile prosthesis. Both of these collections demonstrate internal air bubbles and rim enhancement suspected for infected fluid. Urology consultation recommended. 2. No evidence of intrapelvic or generalized peritoneal inflammation. 3. Nonobstructing bilateral renal calculi. 4. Bilateral L5 pars defects with resulting grade 1 anterolisthesis and  moderate biforaminal stenosis at L5-S1. 5.  Aortic Atherosclerosis (ICD10-I70.0). Electronically Signed   By: Richardean Sale M.D.   On: 06/02/2020 11:45   US SCROTUM W/DOPPLER  Result Date: 06/02/2020 CLINICAL DATA:  Scrotal infection. EXAM: SCROTAL ULTRASOUND DOPPLER ULTRASOUND OF THE  TESTICLES TECHNIQUE: Complete ultrasound examination of the testicles, epididymis, and other scrotal structures was performed. Color and spectral Doppler ultrasound were also utilized to evaluate blood flow to the testicles. COMPARISON:  None. FINDINGS: Right testicle Measurements: 53 x 20 x 30 mm.  No mass or abnormal vascularity Left testicle Measurements:  45 x 26 x 32 mm.  No mass or abnormal vascularity Right epididymis:  Normal in size and appearance. Left epididymis:  Normal in size and appearance. Hydrocele:  Simple hydrocele. Varicocele:  None visualized. Pulsed Doppler interrogation of both testes demonstrates normal low resistance arterial and venous waveforms bilaterally. Other: Penile implant with mid level echoes encapsulated around the tubing in the perineum. Gas is also present and likely within the implant reservoir or, CT would better define the extent and location of fluid/gas in and out of the implant. IMPRESSION: 1. Fluid collection around penile implant hardware, recommend CT with contrast. 2. Negative testicles. Electronically Signed   By: Monte Fantasia M.D.   On: 06/02/2020 10:42    Impression/Assessment:   75 y.o. male with progressive scrotal swelling and erythema with CT showing 2 separate complex fluid collections with rim enhancement surrounding a malpositioned reservoir located in the posterior scrotum and the scrotal pump in the anterior scrotum indicating an infected penile prosthesis  Recommendation:   Patient not toxic appearing  On Coumadin and he is admitted to hospitalist to begin IV antibiotics and warfarin reversal with vitamin K  Plan penoscrotal exploration with incision and drainage scrotal abscesses and removal of penile prosthesis  The procedure was discussed in detail including risks of bleeding and bacteremia/sepsis as well as anesthetic risks  All questions were answered and he desires to proceed  He is not interested in replacement of the penile  prosthesis in the future   06/02/2020, 8:03 PM  John Giovanni,  MD

## 2020-06-02 NOTE — Plan of Care (Signed)

## 2020-06-02 NOTE — ED Provider Notes (Signed)
[facility] Emergency Department Provider Note   ____________________________________________    I have reviewed the triage vital signs and the nursing notes.   HISTORY  Chief Complaint Groin Swelling     HPI Dustin Estrada is a 75 y.o. male who presents with complaints of groin swelling and penile discharge.  Patient reports swelling and erythema and mild discomfort to his scrotum diffusely.  He reports this started about 3 to 4 days ago.  He did have a fever 1 day and had some body aches and fatigue as well although that seems to have improved.  He has not take anything for this.  He does report yellowish discharge.  He does have a history of a penile implant.  No trauma to the area.  He is diabetic.  Past Medical History:  Diagnosis Date  . Arthritis   . Diabetes mellitus without complication ([place])    type 2  . GERD (gastroesophageal reflux disease)   . Hx of blood clots Nov 2016   lung  . Hypertension   . Shortness of breath dyspnea    on exertion  . Sleep apnea    cpap    Patient Active Problem List   Diagnosis Date Noted  . Scrotal infection 06/02/2020  . Hematoma of leg 11/02/2016  . Special screening for malignant neoplasms, colon   . Benign neoplasm of ascending colon   . Benign neoplasm of transverse colon   . Preop examination   . Morbid obesity ([place])   . Hiatal hernia   . Gastritis   . Type 2 diabetes mellitus ([place]) 07/21/2015  . Acid reflux 07/21/2015  . Morbid (severe) obesity due to excess calories ([place]) 07/21/2015  . Arthritis, degenerative 07/21/2015  . Diabetes mellitus ([place]) 07/24/2013  . BP (high blood pressure) 07/24/2013  . HLD (hyperlipidemia) 07/24/2013    Past Surgical History:  Procedure Laterality Date  . CARDIAC CATHETERIZATION    . COLONOSCOPY WITH PROPOFOL N/A 07/28/2015   Procedure: COLONOSCOPY WITH PROPOFOL;  Surgeon: Lucilla Lame, MD;  Location: [facility];  Service: Endoscopy;  Laterality:  N/A;  Insulin dependent diabetic and CPAP  . ESOPHAGOGASTRODUODENOSCOPY (EGD) WITH PROPOFOL N/A 07/23/2015   Procedure: ESOPHAGOGASTRODUODENOSCOPY (EGD) WITH PROPOFOL;  Surgeon: Lucilla Lame, MD;  Location: [facility];  Service: Endoscopy;  Laterality: N/A;  . KNEE SURGERY Left   . 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 MASS EXCISION N/A 02/27/2018   Procedure: EXCISION MASS LEFT LEG;  Surgeon: Herbert Pun, MD;  Location: [facility];  Service: General;  Laterality: N/A;  . POLYPECTOMY  07/28/2015   Procedure: POLYPECTOMY;  Surgeon: Lucilla Lame, MD;  Location: [facility];  Service: Endoscopy;;  . ROTATOR CUFF REPAIR Right     Prior to Admission medications   Medication Sig Start Date End Date Taking? Authorizing Provider  atorvastatin (LIPITOR) 20 MG tablet Take 20 mg by mouth every evening.  09/20/16   [provider]  b complex vitamins tablet Take 1 tablet by mouth daily.    [provider]  Calcium Carb-Cholecalciferol (CALCIUM 600+D3 PO) Take 1 tablet by mouth daily.    [provider]  losartan (COZAAR) 50 MG tablet Take 50 mg by mouth every evening.     [provider]  Multiple Vitamins-Minerals (MULTIVITAMIN PO) Take 2 tablets by mouth daily.    [provider]  TOUJEO SOLOSTAR 300 UNIT/ML SOPN Inject 36 Units into the skin every evening.  [provider]  warfarin (COUMADIN) 5 MG tablet Take 2.5-5 mg by mouth See admin instructions. Take 5 mg by mouth daily on Wednesday. Take 2.5 mg by mouth daily on all other days.    [provider]     Allergies Patient has no known allergies.  Family History  Problem Relation Age of Onset  . Diabetes Father   . Heart attack Maternal Uncle   . Heart attack Maternal Grandfather     Social History Social History   Tobacco Use  . Smoking status: Never Smoker  . Smokeless tobacco: Never Used  Vaping Use  . Vaping Use:  Never used  Substance Use Topics  . Alcohol use: No  . Drug use: No    Review of Systems  Constitutional: As above Eyes: No visual changes.  ENT: No sore throat. Cardiovascular: Denies chest pain. Respiratory: Denies shortness of breath. Gastrointestinal: No abdominal pain.  No nausea, no vomiting.   Genitourinary: As above Musculoskeletal: Myalgias, now improved Skin: Negative for rash. Neurological: Negative for headaches or weakness   ____________________________________________   PHYSICAL EXAM:  VITAL SIGNS: ED Triage Vitals  Enc Vitals Group     BP 06/02/20 0859 124/61     Pulse Rate 06/02/20 0859 71     Resp 06/02/20 0859 18     Temp 06/02/20 0859 (!) 97.5 F (36.4 C)     Temp Source 06/02/20 0859 Oral     SpO2 06/02/20 0859 100 %     Weight 06/02/20 0857 97.5 kg (215 lb)     Height 06/02/20 0857 1.727 m (5\' 8" )     Head Circumference --      Peak Flow --      Pain Score 06/02/20 0857 3     Pain Loc --      Pain Edu? --      Excl. in [place]? --     Constitutional: Alert and oriented.   Nose: No congestion/rhinnorhea. Mouth/Throat: Mucous membranes are moist.    Cardiovascular: Normal rate, regular rhythm. Grossly normal heart sounds.  Good peripheral circulation. Respiratory: Normal respiratory effort.  No retractions. Lungs CTAB. Gastrointestinal: Soft and nontender. No distention.  No CVA tenderness. Genitourinary: Significant swelling of the scrotum diffusely with diffuse erythema, no fluctuance palpated, testes palpated without tenderness.,  Yellowish penile discharge noted on underwear, does not seem to involve the perineum, no crepitus Musculoskeletal:  Warm and well perfused Neurologic:  Normal speech and language. No gross focal neurologic deficits are appreciated.  Skin:  Skin is warm, dry, see above Psychiatric: Mood and affect are normal. Speech and behavior are normal.  ____________________________________________   LABS (all labs ordered  are listed, but only abnormal results are displayed)  Labs Reviewed  COMPREHENSIVE METABOLIC PANEL - Abnormal; Notable for the following components:      Result Value   Sodium 134 (*)    Glucose, Bld 217 (*)    Creatinine, Ser 1.31 (*)    AST 61 (*)    ALT 52 (*)    Total Bilirubin 1.8 (*)    GFR, Estimated 58 (*)    All other components within normal limits  CBC WITH DIFFERENTIAL/PLATELET - Abnormal; Notable for the following components:   WBC 13.1 (*)    Neutro Abs 11.2 (*)    Lymphs Abs 0.6 (*)    Monocytes Absolute 1.2 (*)    All other components within normal limits  CBG MONITORING, ED - Abnormal; Notable for the following components:  Glucose-Capillary 178 (*)    All other components within normal limits  CULTURE, BLOOD (ROUTINE X 2)  CULTURE, BLOOD (ROUTINE X 2)  RESP PANEL BY RT-PCR (FLU A&B, COVID) [id number]  LACTIC ACID, PLASMA  URINALYSIS, COMPLETE (UACMP) WITH MICROSCOPIC   ____________________________________________  EKG  None ____________________________________________  RADIOLOGY  Ultrasound scrotum demonstrates fluid collection around penile implant hardware, will proceed with CT abdomen pelvis ____________________________________________   PROCEDURES  Procedure(s) performed: No  Procedures   Critical Care performed: No ____________________________________________   INITIAL IMPRESSION / ASSESSMENT AND PLAN / ED COURSE  Pertinent labs & imaging results that were available during my care of the patient were reviewed by me and considered in my medical decision making (see chart for details).  Patient presents with significant swelling of the scrotum as noted above, question infection related to penile implant versus cellulitis versus   Afebrile here with normal vitals, pending labs, ultrasound  Ultrasound demonstrates fluid collection around penile implant hardware, white blood cell count is elevated, lactic acid is normal.  Will cover with  IV cefepime at this time pending CT results  CT demonstrates 2 abnormal fluid collections concerning for infection, have discussed with Dr. Bernardo Heater of urology who plans for drainage in the morning, have admitted to the hospitalist team   ____________________________________________   FINAL CLINICAL IMPRESSION(S) / ED DIAGNOSES  Final diagnoses:  Scrotal infection        Note:  This document was prepared using Dragon voice recognition software and may include unintentional dictation errors.   Lavonia Drafts, MD 06/02/20 1308

## 2020-06-02 NOTE — H&P (Signed)
History and Physical  Dustin Estrada FBP:[id number] DOB: 1945/10/15 DOA: 06/02/2020  Referring physician: Dr. Corky Downs, [place] PCP: [name], MD  Outpatient Specialists: Urology. Patient coming from: Home.  Chief Complaint: Worsening scrotal swelling with discharge.  HPI: Dustin Estrada is a 75 y.o. male with medical history significant for penile implant, type 2 diabetes, essential hypertension, chronic A. fib on Coumadin who presented to [facility] ED due to worsening scrotal swelling and discharge.  Onset on Thursday afternoon, 5 days prior to presentation.  Associated with erythema and pain with ambulation.  On Friday and Saturday his underwears were soaked with purulent discharge.  Had a fever of 102 on Saturday and on Sunday a fever of 101, took Tylenol.  Due to worsening symptoms his wife made him come to the ED.  CT abdomen and pelvis with contrast in the ED revealed 2 complex fluid collection in the scrotum which are separate from the testes and surrounding the reservoir and tubing for the patient's penile prosthesis.  Started on IV antibiotics empirically in the ED.  Urology was consulted by EDP.  TRH, hospitalist team, was asked to admit.  ED Course:  T-max 100.1 in the ED.  BP 138/66, pulse 73, respiratory 17, O2 saturation 98% on room air.  Lab studies remarkable for WBC 13,000, serum sodium 134, serum glucose 217, creatinine 1.3, AST 61, ALT 62, total bilirubin 1.8.  Lactic acid 1.9.  Review of Systems: Review of systems as noted in the HPI. All other systems reviewed and are negative.   Past Medical History:  Diagnosis Date  . Arthritis   . Diabetes mellitus without complication ([place])    type 2  . GERD (gastroesophageal reflux disease)   . Hx of blood clots Nov 2016   lung  . Hypertension   . Shortness of breath dyspnea    on exertion  . Sleep apnea    cpap   Past Surgical History:  Procedure Laterality Date  . CARDIAC CATHETERIZATION    . COLONOSCOPY WITH PROPOFOL N/A  07/28/2015   Procedure: COLONOSCOPY WITH PROPOFOL;  Surgeon: Lucilla Lame, MD;  Location: [place];  Service: Endoscopy;  Laterality: N/A;  Insulin dependent diabetic and CPAP  . ESOPHAGOGASTRODUODENOSCOPY (EGD) WITH PROPOFOL N/A 07/23/2015   Procedure: ESOPHAGOGASTRODUODENOSCOPY (EGD) WITH PROPOFOL;  Surgeon: Lucilla Lame, MD;  Location: [facility];  Service: Endoscopy;  Laterality: N/A;  . KNEE SURGERY Left   . 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 MASS EXCISION N/A 02/27/2018   Procedure: EXCISION MASS LEFT LEG;  Surgeon: Herbert Pun, MD;  Location: [facility];  Service: General;  Laterality: N/A;  . POLYPECTOMY  07/28/2015   Procedure: POLYPECTOMY;  Surgeon: Lucilla Lame, MD;  Location: [place];  Service: Endoscopy;;  . ROTATOR CUFF REPAIR Right     Social History:  reports that he has never smoked. He has never used smokeless tobacco. He reports that he does not drink alcohol and does not use drugs.   No Known Allergies  Family History  Problem Relation Age of Onset  . Diabetes Father   . Heart attack Maternal Uncle   . Heart attack Maternal Grandfather       Prior to Admission medications   Medication Sig Start Date End Date Taking? Authorizing Provider  atorvastatin (LIPITOR) 20 MG tablet Take 20 mg by mouth every evening.  09/20/16  Yes [provider]  b complex vitamins tablet Take 1 tablet by mouth daily.  Yes [provider]  Calcium Carb-Cholecalciferol (CALCIUM 600+D3 PO) Take 1 tablet by mouth daily.   Yes [provider]  docusate sodium (COLACE) 100 MG capsule Take by mouth.   Yes [provider]  insulin lispro (HUMALOG) 100 UNIT/ML KwikPen Inject 15 Units into the skin 2 (two) times daily before lunch and supper. 08/28/19  Yes [provider]  losartan (COZAAR) 50 MG tablet Take 50 mg by mouth every evening.    Yes [provider]  Multiple  Vitamins-Minerals (MULTIVITAMIN PO) Take 2 tablets by mouth daily.   Yes [provider]  TOUJEO SOLOSTAR 300 UNIT/ML SOPN Inject 30 Units into the skin every evening.   Yes [provider]  warfarin (COUMADIN) 5 MG tablet Take 2.5-5 mg by mouth See admin instructions. Take 5 mg by mouth daily on Wednesday. Take 2.5 mg by mouth daily on all other days.   Yes [provider]    Physical Exam: BP 138/66   Pulse 73   Temp 100.1 F (37.8 C) (Oral)   Resp 17   Ht 5\' 8"  (1.727 m)   Wt 97.5 kg   SpO2 98%   BMI 32.69 kg/m   . General: 75 y.o. year-old male well developed well nourished in no acute distress.  Alert and oriented x3. . Cardiovascular: Irregular rate and rhythm with no rubs or gallops.  No thyromegaly or JVD noted.  No lower extremity edema. 2/4 pulses in all 4 extremities. Marland Kitchen Respiratory: Clear to auscultation with no wheezes or rales. Good inspiratory effort. . Abdomen: Soft nontender nondistended with normal bowel sounds x4 quadrants. . Muskuloskeletal: No cyanosis, clubbing or edema noted bilaterally . Neuro: CN II-XII intact, strength, sensation, reflexes . Skin: Scrotum edematous, tender with palpation, erythematous with purulent discharge noted. Marland Kitchen Psychiatry: Judgement and insight appear normal. Mood is appropriate for condition and setting          Labs on Admission:  Basic Metabolic Panel: Recent Labs  Lab 06/02/20 0910  NA 134*  K 3.8  CL 99  CO2 22  GLUCOSE 217*  BUN 21  CREATININE 1.31*  CALCIUM 9.0   Liver Function Tests: Recent Labs  Lab 06/02/20 0910  AST 61*  ALT 52*  ALKPHOS 67  BILITOT 1.8*  PROT 7.6  ALBUMIN 3.6   No results for input(s): LIPASE, AMYLASE in the last 168 hours. No results for input(s): AMMONIA in the last 168 hours. CBC: Recent Labs  Lab 06/02/20 0910  WBC 13.1*  NEUTROABS 11.2*  HGB 13.5  HCT 40.0  MCV 94.6  PLT 207   Cardiac Enzymes: No results for input(s): CKTOTAL, CKMB, CKMBINDEX,  TROPONINI in the last 168 hours.  BNP (last 3 results) No results for input(s): BNP in the last 8760 hours.  ProBNP (last 3 results) No results for input(s): PROBNP in the last 8760 hours.  CBG: Recent Labs  Lab 06/02/20 1202 06/02/20 1707 06/02/20 2152  GLUCAP 178* 215* 161*    Radiological Exams on Admission: CT ABDOMEN PELVIS W CONTRAST  Result Date: 06/02/2020 CLINICAL DATA:  Scrotal swelling for 4 days. History of penile implant times 10 years with abnormal scrotal ultrasound. Abdominal abscess/infection suspected. History of cholecystectomy, hiatal hernia and gastric surgeries in 2017. EXAM: CT ABDOMEN AND PELVIS WITH CONTRAST TECHNIQUE: Multidetector CT imaging of the abdomen and pelvis was performed using the standard protocol following bolus administration of intravenous contrast. CONTRAST:  128mL OMNIPAQUE IOHEXOL 300 MG/ML  SOLN COMPARISON:  Scrotal ultrasound same date.  FINDINGS: Lower chest: Clear lung bases. No significant pleural or pericardial effusion. Hepatobiliary: The liver is normal in density without suspicious focal abnormality. No significant biliary dilatation post cholecystectomy. Pancreas: Unremarkable. No pancreatic ductal dilatation or surrounding inflammatory changes. Spleen: Normal in size without focal abnormality. Adrenals/Urinary Tract: Both adrenal glands appear normal. There are small nonobstructing bilateral renal calculi with mild nonspecific symmetric perinephric soft tissue stranding bilaterally. No evidence of ureteral calculus or hydronephrosis. There is a small cyst in the interpolar region of the right kidney. The bladder appears unremarkable. Stomach/Bowel: No enteric contrast administered. There are postsurgical changes in the stomach consistent with partial gastrectomy and gastric bypass procedure. No evidence of bowel wall thickening, distention or surrounding inflammation. The appendix appears normal. There are mild diverticular changes in the  sigmoid colon. Vascular/Lymphatic: There are no enlarged abdominal or pelvic lymph nodes. Mild aortic and branch vessel atherosclerosis. No acute vascular findings. Reproductive: The prostate gland is mildly enlarged. Patient has a penile prosthesis, the reservoir for which appears to be located posteriorly in the scrotum. The reservoir appears decompressed with a surrounding fluid collection measuring 8.1 x 6.0 x 6.2 cm. This collection demonstrates internal air bubbles and peripheral enhancement, suspicious for an infected fluid collection. There is an additional complex fluid collection more anteriorly in the scrotum surrounding the tubing of the penile prosthesis, measuring 7.9 x 6.2 x 9.3 cm. This also demonstrates internal air bubbles and peripheral enhancement. Separate from these collections are small bilateral hydroceles as seen on preceding ultrasound. No inflammatory changes identified in the pelvis. Other: Subcutaneous edema in the anterior abdominal wall, likely related to subcutaneous injections. Small left inguinal hernia containing only fat. No ascites. Musculoskeletal: No acute or significant osseous findings. Multilevel lumbar spondylosis. Bilateral L5 pars defects with grade 1 anterolisthesis and moderate foraminal narrowing bilaterally at L5-S1. Probable hemangioma in the L1 vertebral body. IMPRESSION: 1. There are 2 complex fluid collections in the scrotum which are separate from the testes and surrounding the reservoir and tubing for the patient's penile prosthesis. Both of these collections demonstrate internal air bubbles and rim enhancement suspected for infected fluid. Urology consultation recommended. 2. No evidence of intrapelvic or generalized peritoneal inflammation. 3. Nonobstructing bilateral renal calculi. 4. Bilateral L5 pars defects with resulting grade 1 anterolisthesis and moderate biforaminal stenosis at L5-S1. 5.  Aortic Atherosclerosis (ICD10-I70.0). Electronically Signed    By: Richardean Sale M.D.   On: 06/02/2020 11:45   US SCROTUM W/DOPPLER  Result Date: 06/02/2020 CLINICAL DATA:  Scrotal infection. EXAM: SCROTAL ULTRASOUND DOPPLER ULTRASOUND OF THE TESTICLES TECHNIQUE: Complete ultrasound examination of the testicles, epididymis, and other scrotal structures was performed. Color and spectral Doppler ultrasound were also utilized to evaluate blood flow to the testicles. COMPARISON:  None. FINDINGS: Right testicle Measurements: 53 x 20 x 30 mm.  No mass or abnormal vascularity Left testicle Measurements:  45 x 26 x 32 mm.  No mass or abnormal vascularity Right epididymis:  Normal in size and appearance. Left epididymis:  Normal in size and appearance. Hydrocele:  Simple hydrocele. Varicocele:  None visualized. Pulsed Doppler interrogation of both testes demonstrates normal low resistance arterial and venous waveforms bilaterally. Other: Penile implant with mid level echoes encapsulated around the tubing in the perineum. Gas is also present and likely within the implant reservoir or, CT would better define the extent and location of fluid/gas in and out of the implant. IMPRESSION: 1. Fluid collection around penile implant hardware, recommend CT with contrast. 2. Negative testicles. Electronically Signed  By: Monte Fantasia M.D.   On: 06/02/2020 10:42    EKG: I independently viewed the EKG done and my findings are as followed: None at the time of this visit.  Assessment/Plan Present on Admission: . Scrotal infection  Active Problems:   Scrotal infection  Sepsis secondary to scrotal infection, cellulitis, with concern for infected penile prosthesis Presented with worsening scrotal edema, erythema, tenderness, purulent discharge, fevers at home with T-max 102. Presented with leukocytosis with WBC 13,000. CT abdomen pelvis with concern for infected penile prosthesis. Seen by urology, plan for renal transplant removal on 06/03/2020 if INR is 1.5 or less Started on IV  antibiotics empirically. Obtain sample of purulent discharge and send to lab for aerobic culture Start gentle IV fluid hydration normal saline 50 cc/h x 2 days. Follow cultures, blood x2 peripherally, urine culture. 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 Plan prosthesis removal tomorrow if INR 1.5 or less N.p.o. after midnight.  Presumptive UTI UA positive for pyuria Obtain urine culture and follow cultures Continue empiric Zosyn for suspected infected penile prosthesis  Chronic A. fib on Coumadin Obtain INR Received 1 dose of p.o. vitamin K 5 mg x 1 due to planned penile removal on 06/03/2020. Obtain twelve-lead EKG He is not on any rate control agents prior to admission. Rate is currently controlled. Coumadin on hold, resume Coumadin when okay with urology. Will likely need to be bridged.  AKI, suspect prerenal in the setting of dehydration Baseline creatinine 1.0 GFR greater than 60 Presented with creatinine of 1.3 with GFR 58 Hold off losartan for now Avoid nephrotoxic agents and dehydration Monitor urine output Repeat renal panel in the morning.  Type 2 diabetes with hyperglycemia Obtain hemoglobin A1c Start insulin sliding scale Diabetes coordinator Avoid hyperglycemia  Elevated liver chemistries, unclear etiology CT abdomen and pelvis with contrast unrevealing Alkaline phosphatase normal AST ALT and T bilirubin elevated Repeat CMP in the morning Avoid hepatotoxic agents Hold off home Lipitor for now  Hyperlipidemia Hold off home Lipitor for now due to elevated liver chemistries  Essential hypertension BP is currently stable Hold off home losartan Monitor vital signs Avoid hypotension in the setting of sepsis.   DVT prophylaxis: SCDs.  Code Status: Full code as stated by the patient himself.  Family Communication: Wife at  bedside.  Disposition Plan: Admit to MedSurg unit with remote telemetry  Consults called: Urology.  Admission status: Inpatient status.  Patient will require at least 2 midnights for further evaluation and treatment of present condition.   Status is: Inpatient    Dispo:  Patient From: Home  Planned Disposition: Home, possibly on 06/04/2020 or when Urology signs off.  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         Kayleen Memos MD [facility] Pager [phone]  If 7PM-7AM, please contact night-coverage [email or web address] Password [facility]  06/02/2020, 11:56 PM

## 2020-06-02 NOTE — ED Notes (Addendum)
Pt presents to the ED with Groin Swelling. Pt states that the swelling in his scrotum started Friday 04/15 with clear fluid with pink tinged. Pt states that they had a penile implant around 8 years ago. Pt states that they have difficulty urinating due to the swelling but no pain associated with urination. Pt states pain is associated with movement. Pt states no hx of CHF. Pt is A&Ox4 and NAD.

## 2020-06-02 NOTE — Progress Notes (Signed)
PHARMACY -  BRIEF ANTIBIOTIC NOTE   Pharmacy has received consult(s) for Cefepime from an ED provider.  The patient's profile has been reviewed for ht/wt/allergies/indication/available labs.    One time order(s) placed for Cefepime 2g IV x 1 dose  Further antibiotics/pharmacy consults should be ordered by admitting physician if indicated.                       Thank you, Pearla Dubonnet 06/02/2020  11:03 AM

## 2020-06-02 NOTE — ED Triage Notes (Signed)
Pt c/o swelling or his scrotum since Friday afternoon with blood tinged drainage, states he has a penile Implant.

## 2020-06-02 NOTE — Progress Notes (Signed)
PHARMACIST - PHYSICIAN COMMUNICATION  CONCERNING:  Enoxaparin (Lovenox) for DVT Prophylaxis    RECOMMENDATION: Patient was prescribed enoxaprin 40mg  q24 hours for VTE prophylaxis.   Filed Weights   06/02/20 0857  Weight: 97.5 kg (215 lb)    Body mass index is 32.69 kg/m.  Estimated Creatinine Clearance: 57.7 mL/min (A) (by C-G formula based on SCr of 1.31 mg/dL (H)).   Based on [place] patient is candidate for enoxaparin 0.5mg /kg TBW SQ every 24 hours based on BMI being >30.  DESCRIPTION: Pharmacy has adjusted enoxaparin dose per [facility] policy.  Patient is now receiving enoxaparin 50 mg every 24 hours    Berta Minor, PharmD Clinical Pharmacist  06/02/2020 1:01 PM

## 2020-06-02 NOTE — Consult Note (Signed)
Urology Consult  Requesting physician: Dr. Corky Downs  Reason for consultation: Scrotal infection with penile prosthesis  Chief Complaint: Scrotal pain and swelling  History of Present Illness: Dustin Estrada is a 75 y.o. male who presented to the ED this morning with a 3-4-day history of scrotal discomfort and swelling and a purulent penile discharge.   Intermittent fever to 101 48 hours ago  Mild body aches and malaise  s/p placement of a 3 piece inflatable penile prosthesis by Dr. Yves Dill 2014 via a penoscrotal approach  + Diabetes  Scrotal sonogram initially performed which showed normal-appearing testes bilaterally and fluid collection surrounding penile prosthesis component  CT abdomen pelvis with contrast remarkable for a posterior scrotal location of the reservoir with a surrounding fluid collection measuring up to 8 cm and rim enhancement  A second complex fluid collection noted more anteriorly surrounding the scrotal pump measuring up to 9.3 cm with rim enhancement  He states the prosthesis never really worked and he has not attempted to use in many years   Past Medical History:  Diagnosis Date  . Arthritis   . Diabetes mellitus without complication ([place])    type 2  . GERD (gastroesophageal reflux disease)   . Hx of blood clots Nov 2016   lung  . Hypertension   . Shortness of breath dyspnea    on exertion  . Sleep apnea    cpap    Past Surgical History:  Procedure Laterality Date  . CARDIAC CATHETERIZATION    . COLONOSCOPY WITH PROPOFOL N/A 07/28/2015   Procedure: COLONOSCOPY WITH PROPOFOL;  Surgeon: Lucilla Lame, MD;  Location: [place];  Service: Endoscopy;  Laterality: N/A;  Insulin dependent diabetic and CPAP  . ESOPHAGOGASTRODUODENOSCOPY (EGD) WITH PROPOFOL N/A 07/23/2015   Procedure: ESOPHAGOGASTRODUODENOSCOPY (EGD) WITH PROPOFOL;  Surgeon: Lucilla Lame, MD;  Location: [facility];  Service: Endoscopy;  Laterality: N/A;  . KNEE SURGERY Left    . 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 MASS EXCISION N/A 02/27/2018   Procedure: EXCISION MASS LEFT LEG;  Surgeon: Herbert Pun, MD;  Location: [facility];  Service: General;  Laterality: N/A;  . POLYPECTOMY  07/28/2015   Procedure: POLYPECTOMY;  Surgeon: Lucilla Lame, MD;  Location: [place];  Service: Endoscopy;;  . ROTATOR CUFF REPAIR Right     Home Medications:  Current Meds  Medication Sig  . atorvastatin (LIPITOR) 20 MG tablet Take 20 mg by mouth every evening.   Marland Kitchen b complex vitamins tablet Take 1 tablet by mouth daily.  . Calcium Carb-Cholecalciferol (CALCIUM 600+D3 PO) Take 1 tablet by mouth daily.  Marland Kitchen docusate sodium (COLACE) 100 MG capsule Take by mouth.  . insulin lispro (HUMALOG) 100 UNIT/ML KwikPen Inject 15 Units into the skin 2 (two) times daily before lunch and supper.  . losartan (COZAAR) 50 MG tablet Take 50 mg by mouth every evening.   . Multiple Vitamins-Minerals (MULTIVITAMIN PO) Take 2 tablets by mouth daily.  Nelva Nay SOLOSTAR 300 UNIT/ML SOPN Inject 30 Units into the skin every evening.  . warfarin (COUMADIN) 5 MG tablet Take 2.5-5 mg by mouth See admin instructions. Take 5 mg by mouth daily on Wednesday. Take 2.5 mg by mouth daily on all other days.    Allergies: No Known Allergies  Family History  Problem Relation Age of Onset  . Diabetes Father   . Heart attack Maternal Uncle   . Heart attack Maternal Grandfather     Social History:  reports that he has never smoked. He has never used smokeless tobacco. He reports that he does not drink alcohol and does not use drugs.  ROS: A complete review of systems was performed.  All systems are negative except for pertinent findings as noted.  Physical Exam:  Vital signs in last 24 hours: Temp:  [97.5 F (36.4 C)-100 F (37.8 C)] 100 F (37.8 C) (04/18 1743) Pulse Rate:  [71-110] 110 (04/18 1743) Resp:  [10-18] 17 (04/18 1743) BP: (121-175)/(60-147)  175/147 (04/18 1743) SpO2:  [97 %-100 %] 100 % (04/18 1743) Weight:  [97.5 kg] 97.5 kg (04/18 0857) Constitutional:  Alert, No acute distress, not ill-appearing HEENT: [place] AT, moist mucus membranes.  Trachea midline, no masses Cardiovascular: Regular rate and rhythm, no clubbing, cyanosis, or edema. Respiratory: Normal respiratory effort, lungs clear bilaterally GI: Abdomen is soft, nontender, nondistended, no abdominal masses GU: Phallus retracted with purulent discharge.  Enlarged erythematous scrotum without necrosis or crepitus.  Minimal tenderness Skin: No rashes, bruises or suspicious lesions Lymph: No cervical or inguinal adenopathy Neurologic: Grossly intact, no focal deficits, moving all 4 extremities Psychiatric: Normal mood and affect   Laboratory Data:  Recent Labs    06/02/20 0910  WBC 13.1*  HGB 13.5  HCT 40.0   Recent Labs    06/02/20 0910  NA 134*  K 3.8  CL 99  CO2 22  GLUCOSE 217*  BUN 21  CREATININE 1.31*  CALCIUM 9.0   No results for input(s): LABPT, INR in the last 72 hours. No results for input(s): LABURIN in the last 72 hours. Results for orders placed or performed during the hospital encounter of 06/02/20  Resp Panel by RT-PCR (Flu A&B, Covid) Nasopharyngeal Swab     Status: None   Collection Time: 06/02/20 12:12 PM   Specimen: Nasopharyngeal Swab; Nasopharyngeal(NP) swabs in vial transport medium  Result Value Ref Range Status   SARS Coronavirus 2 by RT PCR NEGATIVE NEGATIVE Final    Comment: (NOTE) SARS-CoV-2 target nucleic acids are NOT DETECTED.  The SARS-CoV-2 RNA is generally detectable in upper respiratory specimens during the acute phase of infection. The lowest concentration of SARS-CoV-2 viral copies this assay can detect is 138 copies/mL. A negative result does not preclude SARS-Cov-2 infection and should not be used as the sole basis for treatment or other patient management decisions. A negative result may occur with  improper  specimen collection/handling, submission of specimen other than nasopharyngeal swab, presence of viral mutation(s) within the areas targeted by this assay, and inadequate number of viral copies(<138 copies/mL). A negative result must be combined with clinical observations, patient history, and epidemiological information. The expected result is Negative.  Fact Sheet for Patients:  EntrepreneurPulse.com.au  Fact Sheet for Healthcare Providers:  IncredibleEmployment.be  This test is no t yet approved or cleared by the [place] FDA and  has been authorized for detection and/or diagnosis of SARS-CoV-2 by FDA under an Emergency Use Authorization (EUA). This EUA will remain  in effect (meaning this test can be used) for the duration of the COVID-19 declaration under Section 564(b)(1) of the Act, 21 U.S.C.section 360bbb-3(b)(1), unless the authorization is terminated  or revoked sooner.       Influenza A by PCR NEGATIVE NEGATIVE Final   Influenza B by PCR NEGATIVE NEGATIVE Final    Comment: (NOTE) The Xpert Xpress SARS-CoV-2/FLU/RSV plus assay is intended as an aid in the diagnosis of influenza from Nasopharyngeal swab specimens and should not be used as a sole  basis for treatment. Nasal washings and aspirates are unacceptable for Xpert Xpress SARS-CoV-2/FLU/RSV testing.  Fact Sheet for Patients: EntrepreneurPulse.com.au  Fact Sheet for Healthcare Providers: IncredibleEmployment.be  This test is not yet approved or cleared by the [place] FDA and has been authorized for detection and/or diagnosis of SARS-CoV-2 by FDA under an Emergency Use Authorization (EUA). This EUA will remain in effect (meaning this test can be used) for the duration of the COVID-19 declaration under Section 564(b)(1) of the Act, 21 U.S.C. section 360bbb-3(b)(1), unless the authorization is terminated or revoked.  Performed at  [facility], [address], [place], [place] [address]      Radiologic Imaging: CT images were personally reviewed and interpreted  CT ABDOMEN PELVIS W CONTRAST  Result Date: 06/02/2020 CLINICAL DATA:  Scrotal swelling for 4 days. History of penile implant times 10 years with abnormal scrotal ultrasound. Abdominal abscess/infection suspected. History of cholecystectomy, hiatal hernia and gastric surgeries in 2017. EXAM: CT ABDOMEN AND PELVIS WITH CONTRAST TECHNIQUE: Multidetector CT imaging of the abdomen and pelvis was performed using the standard protocol following bolus administration of intravenous contrast. CONTRAST:  15mL OMNIPAQUE IOHEXOL 300 MG/ML  SOLN COMPARISON:  Scrotal ultrasound same date. FINDINGS: Lower chest: Clear lung bases. No significant pleural or pericardial effusion. Hepatobiliary: The liver is normal in density without suspicious focal abnormality. No significant biliary dilatation post cholecystectomy. Pancreas: Unremarkable. No pancreatic ductal dilatation or surrounding inflammatory changes. Spleen: Normal in size without focal abnormality. Adrenals/Urinary Tract: Both adrenal glands appear normal. There are small nonobstructing bilateral renal calculi with mild nonspecific symmetric perinephric soft tissue stranding bilaterally. No evidence of ureteral calculus or hydronephrosis. There is a small cyst in the interpolar region of the right kidney. The bladder appears unremarkable. Stomach/Bowel: No enteric contrast administered. There are postsurgical changes in the stomach consistent with partial gastrectomy and gastric bypass procedure. No evidence of bowel wall thickening, distention or surrounding inflammation. The appendix appears normal. There are mild diverticular changes in the sigmoid colon. Vascular/Lymphatic: There are no enlarged abdominal or pelvic lymph nodes. Mild aortic and branch vessel atherosclerosis. No acute vascular findings. Reproductive: The  prostate gland is mildly enlarged. Patient has a penile prosthesis, the reservoir for which appears to be located posteriorly in the scrotum. The reservoir appears decompressed with a surrounding fluid collection measuring 8.1 x 6.0 x 6.2 cm. This collection demonstrates internal air bubbles and peripheral enhancement, suspicious for an infected fluid collection. There is an additional complex fluid collection more anteriorly in the scrotum surrounding the tubing of the penile prosthesis, measuring 7.9 x 6.2 x 9.3 cm. This also demonstrates internal air bubbles and peripheral enhancement. Separate from these collections are small bilateral hydroceles as seen on preceding ultrasound. No inflammatory changes identified in the pelvis. Other: Subcutaneous edema in the anterior abdominal wall, likely related to subcutaneous injections. Small left inguinal hernia containing only fat. No ascites. Musculoskeletal: No acute or significant osseous findings. Multilevel lumbar spondylosis. Bilateral L5 pars defects with grade 1 anterolisthesis and moderate foraminal narrowing bilaterally at L5-S1. Probable hemangioma in the L1 vertebral body. IMPRESSION: 1. There are 2 complex fluid collections in the scrotum which are separate from the testes and surrounding the reservoir and tubing for the patient's penile prosthesis. Both of these collections demonstrate internal air bubbles and rim enhancement suspected for infected fluid. Urology consultation recommended. 2. No evidence of intrapelvic or generalized peritoneal inflammation. 3. Nonobstructing bilateral renal calculi. 4. Bilateral L5 pars defects with resulting grade 1 anterolisthesis and  moderate biforaminal stenosis at L5-S1. 5.  Aortic Atherosclerosis (ICD10-I70.0). Electronically Signed   By: Richardean Sale M.D.   On: 06/02/2020 11:45   US SCROTUM W/DOPPLER  Result Date: 06/02/2020 CLINICAL DATA:  Scrotal infection. EXAM: SCROTAL ULTRASOUND DOPPLER ULTRASOUND OF THE  TESTICLES TECHNIQUE: Complete ultrasound examination of the testicles, epididymis, and other scrotal structures was performed. Color and spectral Doppler ultrasound were also utilized to evaluate blood flow to the testicles. COMPARISON:  None. FINDINGS: Right testicle Measurements: 53 x 20 x 30 mm.  No mass or abnormal vascularity Left testicle Measurements:  45 x 26 x 32 mm.  No mass or abnormal vascularity Right epididymis:  Normal in size and appearance. Left epididymis:  Normal in size and appearance. Hydrocele:  Simple hydrocele. Varicocele:  None visualized. Pulsed Doppler interrogation of both testes demonstrates normal low resistance arterial and venous waveforms bilaterally. Other: Penile implant with mid level echoes encapsulated around the tubing in the perineum. Gas is also present and likely within the implant reservoir or, CT would better define the extent and location of fluid/gas in and out of the implant. IMPRESSION: 1. Fluid collection around penile implant hardware, recommend CT with contrast. 2. Negative testicles. Electronically Signed   By: Monte Fantasia M.D.   On: 06/02/2020 10:42    Impression/Assessment:   75 y.o. male with progressive scrotal swelling and erythema with CT showing 2 separate complex fluid collections with rim enhancement surrounding a malpositioned reservoir located in the posterior scrotum and the scrotal pump in the anterior scrotum indicating an infected penile prosthesis  Recommendation:   Patient not toxic appearing  On Coumadin and he is admitted to hospitalist to begin IV antibiotics and warfarin reversal with vitamin K  Plan penoscrotal exploration with incision and drainage scrotal abscesses and removal of penile prosthesis  The procedure was discussed in detail including risks of bleeding and bacteremia/sepsis as well as anesthetic risks  All questions were answered and he desires to proceed  He is not interested in replacement of the penile  prosthesis in the future   06/02/2020, 8:03 PM  John Giovanni,  MD

## 2020-06-02 NOTE — ED Notes (Signed)
Pt transported to CT ?

## 2020-06-02 NOTE — Consult Note (Signed)
Pharmacy Antibiotic Note  Dustin Estrada is a 75 y.o. male with medical history including diabetes, HTN, history of penile implant admitted on 06/02/2020 with groin swelling / penile discharge. Onset 3-4 days prior to presentation. Per EDP note, no trauma to area. Ultrasound with "fluid collection around penile implant hardware". CT scan with "2 complex fluid collections in the scrotum which are separate from the testes and surrounding the reservoir and tubing for the patient's penile prosthesis. Both of these collections demonstrate internal air bubbles and rim enhancement suspected for infected fluid". Pharmacy has been consulted for Zosyn dosing.  Plan: Zosyn 3.375g IV q8h (4 hour infusion).  Height: 5\' 8"  (172.7 cm) Weight: 97.5 kg (215 lb) IBW/kg (Calculated) : 68.4  Temp (24hrs), Avg:97.5 F (36.4 C), Min:97.5 F (36.4 C), Max:97.5 F (36.4 C)  Recent Labs  Lab 06/02/20 0910  WBC 13.1*  CREATININE 1.31*  LATICACIDVEN 1.9    Estimated Creatinine Clearance: 57.7 mL/min (A) (by C-G formula based on SCr of 1.31 mg/dL (H)).    No Known Allergies  Antimicrobials this admission: Cefepime 4/18 x 1 Vancomycin 4/18 x 1 Zosyn 4/18 >>   Dose adjustments this admission: N/A  Microbiology results: 4/18 Scrotal wound: pending 4/18 BCx: pending 4/18 MRSA PCR: pending  Thank you for allowing pharmacy to be a part of this patient's care.  Benita Gutter 06/02/2020 1:34 PM

## 2020-06-02 NOTE — ED Notes (Signed)
Pt requesting his CBG be checked at this time.

## 2020-06-03 ENCOUNTER — Inpatient Hospital Stay: Payer: Medicare Other | Admitting: Certified Registered Nurse Anesthetist

## 2020-06-03 ENCOUNTER — Encounter: Payer: Self-pay | Admitting: Internal Medicine

## 2020-06-03 ENCOUNTER — Encounter
Admission: EM | Disposition: A | Discharge status: Home / Self Care | Payer: Self-pay | Source: Home / Self Care | Attending: Internal Medicine

## 2020-06-03 DIAGNOSIS — T8361XA Infection and inflammatory reaction due to implanted penile prosthesis, initial encounter: Secondary | ICD-10-CM

## 2020-06-03 DIAGNOSIS — N492 Inflammatory disorders of scrotum: Secondary | ICD-10-CM

## 2020-06-03 HISTORY — PX: CYSTOSCOPY: SHX5120

## 2020-06-03 HISTORY — PX: REMOVAL OF PENILE PROSTHESIS: SHX6059

## 2020-06-03 LAB — BLOOD CULTURE ID PANEL (REFLEXED) - BCID2

## 2020-06-03 LAB — MRSA PCR SCREENING: MRSA by PCR: NEGATIVE

## 2020-06-03 LAB — HEMOGLOBIN A1C
Hgb A1c MFr Bld: 6.8 % — ABNORMAL HIGH (ref 4.8–5.6)
Mean Plasma Glucose: 148.46 mg/dL

## 2020-06-03 LAB — GLUCOSE, CAPILLARY
Glucose-Capillary: 155 mg/dL — ABNORMAL HIGH (ref 70–99)
Glucose-Capillary: 176 mg/dL — ABNORMAL HIGH (ref 70–99)
Glucose-Capillary: 187 mg/dL — ABNORMAL HIGH (ref 70–99)
Glucose-Capillary: 190 mg/dL — ABNORMAL HIGH (ref 70–99)
Glucose-Capillary: 345 mg/dL — ABNORMAL HIGH (ref 70–99)

## 2020-06-03 LAB — CBC WITH DIFFERENTIAL/PLATELET
Abs Immature Granulocytes: 0.03 10*3/uL (ref 0.00–0.07)
Basophils Absolute: 0 10*3/uL (ref 0.0–0.1)
Basophils Relative: 0 %
Eosinophils Absolute: 0 10*3/uL (ref 0.0–0.5)
Eosinophils Relative: 0 %
HCT: 36.6 % — ABNORMAL LOW (ref 39.0–52.0)
Hemoglobin: 12.3 g/dL — ABNORMAL LOW (ref 13.0–17.0)
Immature Granulocytes: 0 %
Lymphocytes Relative: 11 %
Lymphs Abs: 1.2 10*3/uL (ref 0.7–4.0)
MCH: 31.9 pg (ref 26.0–34.0)
MCHC: 33.6 g/dL (ref 30.0–36.0)
MCV: 94.8 fL (ref 80.0–100.0)
Monocytes Absolute: 1.3 10*3/uL — ABNORMAL HIGH (ref 0.1–1.0)
Monocytes Relative: 12 %
Neutro Abs: 8.3 10*3/uL — ABNORMAL HIGH (ref 1.7–7.7)
Neutrophils Relative %: 77 %
Platelets: 193 10*3/uL (ref 150–400)
RBC: 3.86 MIL/uL — ABNORMAL LOW (ref 4.22–5.81)
RDW: 12.7 % (ref 11.5–15.5)
WBC: 10.8 10*3/uL — ABNORMAL HIGH (ref 4.0–10.5)
nRBC: 0 % (ref 0.0–0.2)

## 2020-06-03 LAB — PROTIME-INR
INR: 2.5 — ABNORMAL HIGH (ref 0.8–1.2)
INR: 2.4 — ABNORMAL HIGH (ref 0.8–1.2)
INR: 2.2 — ABNORMAL HIGH (ref 0.8–1.2)
Prothrombin Time: 26.9 seconds — ABNORMAL HIGH (ref 11.4–15.2)
Prothrombin Time: 25.9 seconds — ABNORMAL HIGH (ref 11.4–15.2)
Prothrombin Time: 24.6 seconds — ABNORMAL HIGH (ref 11.4–15.2)

## 2020-06-03 LAB — COMPREHENSIVE METABOLIC PANEL
ALT: 63 U/L — ABNORMAL HIGH (ref 0–44)
AST: 57 U/L — ABNORMAL HIGH (ref 15–41)
Albumin: 3 g/dL — ABNORMAL LOW (ref 3.5–5.0)
Alkaline Phosphatase: 64 U/L (ref 38–126)
Anion gap: 8 (ref 5–15)
BUN: 22 mg/dL (ref 8–23)
CO2: 26 mmol/L (ref 22–32)
Calcium: 8.9 mg/dL (ref 8.9–10.3)
Chloride: 104 mmol/L (ref 98–111)
Creatinine, Ser: 1.33 mg/dL — ABNORMAL HIGH (ref 0.61–1.24)
GFR, Estimated: 57 mL/min — ABNORMAL LOW (ref >60)
Glucose, Bld: 167 mg/dL — ABNORMAL HIGH (ref 70–99)
Potassium: 4.4 mmol/L (ref 3.5–5.1)
Sodium: 138 mmol/L (ref 135–145)
Total Bilirubin: 1.4 mg/dL — ABNORMAL HIGH (ref 0.3–1.2)
Total Protein: 6.6 g/dL (ref 6.5–8.1)

## 2020-06-03 LAB — MAGNESIUM: Magnesium: 1.8 mg/dL (ref 1.7–2.4)

## 2020-06-03 LAB — PHOSPHORUS: Phosphorus: 4.5 mg/dL (ref 2.5–4.6)

## 2020-06-03 SURGERY — REMOVAL, PENILE PROSTHESIS
Anesthesia: General

## 2020-06-03 MED ORDER — FENTANYL CITRATE (PF) 100 MCG/2ML IJ SOLN
INTRAMUSCULAR | Status: AC
  Filled 2020-06-03: qty 2

## 2020-06-03 MED ORDER — EPHEDRINE SULFATE 50 MG/ML IJ SOLN
INTRAMUSCULAR | Status: DC | PRN
  Administered 2020-06-03: 10 mg via INTRAVENOUS

## 2020-06-03 MED ORDER — ROCURONIUM BROMIDE 100 MG/10ML IV SOLN
INTRAVENOUS | Status: DC | PRN
  Administered 2020-06-03 (×3): 10 mg via INTRAVENOUS
  Administered 2020-06-03 (×2): 20 mg via INTRAVENOUS

## 2020-06-03 MED ORDER — KETAMINE HCL 10 MG/ML IJ SOLN
INTRAMUSCULAR | Status: DC | PRN
  Administered 2020-06-03 (×2): 10 mg via INTRAVENOUS

## 2020-06-03 MED ORDER — GLYCOPYRROLATE 0.2 MG/ML IJ SOLN
INTRAMUSCULAR | Status: DC | PRN
  Administered 2020-06-03: .2 mg via INTRAVENOUS

## 2020-06-03 MED ORDER — SODIUM CHLORIDE 0.9 % IV SOLN
2.0000 g | Freq: Two times a day (BID) | INTRAVENOUS | Status: DC
  Administered 2020-06-03 – 2020-06-05 (×5): 2 g via INTRAVENOUS
  Filled 2020-06-03 (×7): qty 2

## 2020-06-03 MED ORDER — LIDOCAINE HCL (CARDIAC) PF 100 MG/5ML IV SOSY
PREFILLED_SYRINGE | INTRAVENOUS | Status: DC | PRN
  Administered 2020-06-03: 100 mg via INTRAVENOUS

## 2020-06-03 MED ORDER — PIPERACILLIN-TAZOBACTAM 3.375 G IVPB
INTRAVENOUS | Status: AC
  Filled 2020-06-03: qty 50

## 2020-06-03 MED ORDER — ONDANSETRON HCL 4 MG/2ML IJ SOLN: 4.0000 mg | Freq: Four times a day (QID) | INTRAMUSCULAR | Status: DC | PRN

## 2020-06-03 MED ORDER — ONDANSETRON HCL 4 MG/2ML IJ SOLN: 4.0000 mg | Freq: Once | INTRAMUSCULAR | Status: DC | PRN

## 2020-06-03 MED ORDER — PHYTONADIONE 5 MG PO TABS
2.5000 mg | ORAL_TABLET | Freq: Once | ORAL | Status: AC
  Administered 2020-06-03: 2.5 mg via ORAL
  Filled 2020-06-03: qty 1

## 2020-06-03 MED ORDER — DEXMEDETOMIDINE (PRECEDEX) IN NS 20 MCG/5ML (4 MCG/ML) IV SYRINGE
PREFILLED_SYRINGE | INTRAVENOUS | Status: DC | PRN
Start: 2020-06-03 — End: 2020-06-03
  Administered 2020-06-03 (×2): 4 ug via INTRAVENOUS
  Administered 2020-06-03: 8 ug via INTRAVENOUS
  Administered 2020-06-03: 4 ug via INTRAVENOUS

## 2020-06-03 MED ORDER — METOPROLOL TARTRATE 5 MG/5ML IV SOLN: 2.5000 mg | INTRAVENOUS | Status: DC | PRN

## 2020-06-03 MED ORDER — SUGAMMADEX SODIUM 200 MG/2ML IV SOLN
INTRAVENOUS | Status: DC | PRN
  Administered 2020-06-03: 500 mg via INTRAVENOUS

## 2020-06-03 MED ORDER — SUCCINYLCHOLINE CHLORIDE 20 MG/ML IJ SOLN
INTRAMUSCULAR | Status: DC | PRN
  Administered 2020-06-03: 120 mg via INTRAVENOUS

## 2020-06-03 MED ORDER — VANCOMYCIN HCL 1500 MG/300ML IV SOLN
1500.0000 mg | INTRAVENOUS | Status: DC
  Administered 2020-06-03: 1000 mg via INTRAVENOUS
  Administered 2020-06-04: 1500 mg via INTRAVENOUS
  Filled 2020-06-03 (×2): qty 300

## 2020-06-03 MED ORDER — FENTANYL CITRATE (PF) 100 MCG/2ML IJ SOLN
INTRAMUSCULAR | Status: DC | PRN
  Administered 2020-06-03: 50 ug via INTRAVENOUS
  Administered 2020-06-03: 100 ug via INTRAVENOUS
  Administered 2020-06-03: 50 ug via INTRAVENOUS

## 2020-06-03 MED ORDER — DEXAMETHASONE SODIUM PHOSPHATE 10 MG/ML IJ SOLN
INTRAMUSCULAR | Status: DC | PRN
  Administered 2020-06-03: 10 mg via INTRAVENOUS

## 2020-06-03 MED ORDER — ONDANSETRON HCL 4 MG/2ML IJ SOLN
INTRAMUSCULAR | Status: DC | PRN
  Administered 2020-06-03: 4 mg via INTRAVENOUS

## 2020-06-03 MED ORDER — CHLORHEXIDINE GLUCONATE CLOTH 2 % EX PADS
6.0000 | MEDICATED_PAD | Freq: Every day | CUTANEOUS | Status: DC
  Administered 2020-06-03 – 2020-06-05 (×3): 6 via TOPICAL

## 2020-06-03 MED ORDER — FENTANYL CITRATE (PF) 100 MCG/2ML IJ SOLN
25.0000 ug | INTRAMUSCULAR | Status: DC | PRN
  Administered 2020-06-03 (×2): 25 ug via INTRAVENOUS

## 2020-06-03 MED ORDER — PROPOFOL 10 MG/ML IV BOLUS
INTRAVENOUS | Status: DC | PRN
  Administered 2020-06-03 (×2): 100 mg via INTRAVENOUS

## 2020-06-03 MED ORDER — KETAMINE HCL 50 MG/5ML IJ SOSY
PREFILLED_SYRINGE | INTRAMUSCULAR | Status: AC
  Filled 2020-06-03: qty 5

## 2020-06-03 SURGICAL SUPPLY — 41 items
BAG URINE DRAIN 2000ML AR STRL (UROLOGICAL SUPPLIES) ×1 IMPLANT
BLADE CLIPPER SURG (BLADE) ×3 IMPLANT
BLADE SURG 15 STRL LF DISP TIS (BLADE) ×2 IMPLANT
BLADE SURG 15 STRL SS (BLADE) ×1
BNDG COHESIVE 1X5 TAN NS LF (GAUZE/BANDAGES/DRESSINGS) IMPLANT
BNDG CONFORM 2 STRL LF (GAUZE/BANDAGES/DRESSINGS) ×3 IMPLANT
BNDG GAUZE 4.5X4.1 6PLY STRL (MISCELLANEOUS) ×1 IMPLANT
CANISTER SUCT 1200ML W/VALVE (MISCELLANEOUS) ×2 IMPLANT
CATH FOL 2WAY LX 20X30 (CATHETERS) ×1 IMPLANT
CHLORAPREP W/TINT 26 (MISCELLANEOUS) ×3 IMPLANT
COVER WAND RF STERILE (DRAPES) ×3 IMPLANT
DRAPE LAPAROTOMY 77X122 PED (DRAPES) ×3 IMPLANT
DRSG GAUZE FLUFF 36X18 (GAUZE/BANDAGES/DRESSINGS) ×1 IMPLANT
ELECT REM PT RETURN 9FT ADLT (ELECTROSURGICAL) ×3
ELECTRODE REM PT RTRN 9FT ADLT (ELECTROSURGICAL) ×2 IMPLANT
GAUZE PETROLATUM 1 X8 (GAUZE/BANDAGES/DRESSINGS) ×2 IMPLANT
GLOVE SURG UNDER POLY LF SZ7.5 (GLOVE) ×3 IMPLANT
GOWN STRL REUS W/ TWL LRG LVL3 (GOWN DISPOSABLE) ×2 IMPLANT
GOWN STRL REUS W/ TWL XL LVL3 (GOWN DISPOSABLE) ×2 IMPLANT
GOWN STRL REUS W/TWL LRG LVL3 (GOWN DISPOSABLE) ×1
GOWN STRL REUS W/TWL XL LVL3 (GOWN DISPOSABLE) ×1
HOLDER FOLEY CATH W/STRAP (MISCELLANEOUS) ×1 IMPLANT
HOOK STAY BLUNT/RETRACTOR 5M (MISCELLANEOUS) ×1 IMPLANT
IV NS 1000ML (IV SOLUTION) ×1
IV NS 1000ML BAXH (IV SOLUTION) IMPLANT
KIT TURNOVER KIT A (KITS) ×3 IMPLANT
LABEL OR SOLS (LABEL) ×3 IMPLANT
MANIFOLD NEPTUNE II (INSTRUMENTS) ×3 IMPLANT
NDL HYPO 25X1 1.5 SAFETY (NEEDLE) ×2 IMPLANT
NEEDLE HYPO 25X1 1.5 SAFETY (NEEDLE) ×3 IMPLANT
NS IRRIG 500ML POUR BTL (IV SOLUTION) ×3 IMPLANT
PACK BASIN MINOR ARMC (MISCELLANEOUS) ×3 IMPLANT
PAD ABD DERMACEA PRESS 5X9 (GAUZE/BANDAGES/DRESSINGS) ×1 IMPLANT
RETRACTOR STERILE 25.8CMX11.3 (INSTRUMENTS) ×1 IMPLANT
SET CYSTO W/LG BORE CLAMP LF (SET/KITS/TRAYS/PACK) ×1 IMPLANT
SOL PREP PVP 2OZ (MISCELLANEOUS)
SOLUTION PREP PVP 2OZ (MISCELLANEOUS) ×2 IMPLANT
STRETCH NET 2 107126 (MISCELLANEOUS) ×2 IMPLANT
SUT CHROMIC 3 0 SH 27 (SUTURE) ×2 IMPLANT
SUT CHROMIC 4 0 RB 1X27 (SUTURE) ×3 IMPLANT
SYR 10ML LL (SYRINGE) ×3 IMPLANT

## 2020-06-03 NOTE — Consult Note (Addendum)
Pharmacy Antibiotic Note  Dustin Estrada is a 75 y.o. male with medical history including diabetes, HTN, history of penile implant admitted on 06/02/2020 with groin swelling / penile discharge. Onset 3-4 days prior to presentation. Per EDP note, no trauma to area. Ultrasound with "fluid collection around penile implant hardware". CT scan with "2 complex fluid collections in the scrotum which are separate from the testes and surrounding the reservoir and tubing for the patient's penile prosthesis. Both of these collections demonstrate internal air bubbles and rim enhancement suspected for infected fluid". Pharmacy has been consulted for vancomycin and cefepime  Today, 06/03/2020 Day #2 antibiotics.  WBC 13.1 to 10.8  Tm 100.1  Renal: SCr stable at 1.33  4/18 Blood cx: GPC in both bottles of one set, BCID = Staphylococcus species (not aureus or epidermidis)  Vancomycin being dosed by physician - now vanco per pharmacy  Plan:  Adjust Vancomycin to 1500mg  IV q24h   Expected AUC 478 (goal 400-600), used SCr 1.33, Vd 0.72  Plan to check levels if remains on vancomycin > 5 days  Piperacillin/tazobactam to cefepime 2gm IV q12h based on CrCl < 60 ml/min  Monitor renal function closely  Follow-up Blood cultures results, speciation of Staph species  Await OR culture findings  Height: 5\' 8"  (172.7 cm) Weight: 97.5 kg (215 lb) IBW/kg (Calculated) : 68.4  Temp (24hrs), Avg:98.8 F (37.1 C), Min:97.5 F (36.4 C), Max:100.1 F (37.8 C)  Recent Labs  Lab 06/02/20 0910 06/03/20 0434  WBC 13.1* 10.8*  CREATININE 1.31* 1.33*  LATICACIDVEN 1.9  --     Estimated Creatinine Clearance: 56.8 mL/min (A) (by C-G formula based on SCr of 1.33 mg/dL (H)).    No Known Allergies  Antimicrobials this admission: Cefepime 4/18 x 1, 4/19 >> Vancomycin 4/18 x 1 Zosyn 4/18 >> 4/19  Dose adjustments this admission: N/A  Microbiology results: 4/ Scrotal wound:  4/18 BCx: 1/2 sets (both  bottles) GPC, BCID = Staph species 4/18 MRSA PCR: Neg  Thank you for allowing pharmacy to be a part of this patient's care.  Doreene Eland, PharmD, BCPS.   Work Cell: [phone] 06/03/2020 8:44 AM

## 2020-06-03 NOTE — Anesthesia Procedure Notes (Signed)
Procedure Name: Intubation Date/Time: 06/03/2020 1:45 PM Performed by: Tollie Eth, CRNA Pre-anesthesia Checklist: Patient identified, Patient being monitored, Timeout performed, Emergency Drugs available and Suction available Patient Re-evaluated:Patient Re-evaluated prior to induction Oxygen Delivery Method: Circle system utilized Preoxygenation: Pre-oxygenation with 100% oxygen Induction Type: IV induction Ventilation: Mask ventilation without difficulty Laryngoscope Size: McGraph and 4 Grade View: Grade I Tube type: Oral Tube size: 7.5 mm Number of attempts: 1 Airway Equipment and Method: Stylet and Video-laryngoscopy Placement Confirmation: ETT inserted through vocal cords under direct vision,  positive ETCO2 and breath sounds checked- equal and bilateral Secured at: 21 cm Tube secured with: Tape Dental Injury: Teeth and Oropharynx as per pre-operative assessment  Comments: Attempted LMA 4 and LMA 5. LMA would not seat. Switched to ETT

## 2020-06-03 NOTE — OR Nursing (Signed)
The entire prosthesis was successfully removed by Dr. Bernardo Heater.

## 2020-06-03 NOTE — Progress Notes (Signed)
Md on call notified of PT/INR result. To repeat  coags att 0500.

## 2020-06-03 NOTE — Progress Notes (Signed)
Inpatient Diabetes Program Recommendations  AACE/ADA: New Consensus Statement on Inpatient Glycemic Control (2015)  Target Ranges:  Prepandial:   less than 140 mg/dL      Peak postprandial:   less than 180 mg/dL (1-2 hours)      Critically ill patients:  140 - 180 mg/dL   Results for Dustin Estrada, Dustin Estrada (MRN [id number]) as of 06/03/2020 12:28  Ref. Range 06/03/2020 04:34  Hemoglobin A1C Latest Ref Range: 4.8 - 5.6 % 6.8 (H)   Results for Dustin Estrada, Dustin Estrada (MRN [id number]) as of 06/03/2020 08:02  Ref. Range 06/02/2020 12:02 06/02/2020 17:07 06/02/2020 21:52 06/03/2020 03:39  Glucose-Capillary Latest Ref Range: 70 - 99 mg/dL 178 (H) 215 (H)  3 units NOVOLOG  161 (H) 155 (H)   Results for Dustin Estrada, Dustin Estrada (MRN [id number]) as of 06/03/2020 12:28  Ref. Range 06/03/2020 08:31  Glucose-Capillary Latest Ref Range: 70 - 99 mg/dL 176 (H)   Admit with: Sepsis secondary to scrotal infection, cellulitis, with concern for infected penile prosthesis  History: DM  Home DM Meds: Humalog 15 units BID (Lunch and Dinner)       Toujeo 30 units Daily  Current Orders: Novolog Sensitive Correction Scale/ SSI (0-9 units) TID AC + HS   MD- May consider starting Lantus 10 units QHS (1/3 total home dose to start)   Endocrinologist: Dr. Steffanie Dunn with [facility] Last seen 12/17/2019 Was instructed to take the following: --Continue Toujeo 30 units once daily (at bedtime).  --Decrease Humalog to 12 units before any meal where you are planning to eat at least 2 carb servings (up to 2 times daily).   --Will follow patient during hospitalization--  Wyn Quaker RN, MSN, CDE Diabetes Coordinator Inpatient Glycemic Control Team Team Pager: [phone] (8a-5p)

## 2020-06-03 NOTE — Anesthesia Preprocedure Evaluation (Signed)
Anesthesia Evaluation  Patient identified by MRN, date of birth, ID band Patient awake    Reviewed: Allergy & Precautions, H&P , NPO status , Patient's Chart, lab work & pertinent test results, reviewed documented beta blocker date and time   History of Anesthesia Complications Negative for: history of anesthetic complications  Airway Mallampati: II   Neck ROM: full    Dental  (+) Poor Dentition, Dental Advidsory Given, Missing   Pulmonary neg shortness of breath, sleep apnea , neg COPD, neg recent URI,    Pulmonary exam normal        Cardiovascular Exercise Tolerance: Good hypertension, On Medications (-) angina(-) Past MI and (-) Cardiac Stents Normal cardiovascular exam(-) dysrhythmias (-) Valvular Problems/Murmurs Rhythm:regular Rate:Normal     Neuro/Psych negative neurological ROS  negative psych ROS   GI/Hepatic Neg liver ROS, hiatal hernia, GERD  Medicated,  Endo/Other  diabetes, Well Controlled  Renal/GU negative Renal ROS  negative genitourinary   Musculoskeletal   Abdominal   Peds  Hematology negative hematology ROS (+)   Anesthesia Other Findings Past Medical History: No date: Arthritis No date: Diabetes mellitus without complication ([facility])     Comment:  type 2 No date: GERD (gastroesophageal reflux disease) Nov 2016: Hx of blood clots     Comment:  lung No date: Hypertension No date: Shortness of breath dyspnea     Comment:  on exertion No date: Sleep apnea     Comment:  cpap Past Surgical History: No date: CARDIAC CATHETERIZATION 07/28/2015: COLONOSCOPY WITH PROPOFOL; N/A     Comment:  Procedure: COLONOSCOPY WITH PROPOFOL;  Surgeon: Lucilla Lame, MD;  Location: [place];  Service:               Endoscopy;  Laterality: N/A;  Insulin dependent diabetic               and CPAP 07/23/2015: ESOPHAGOGASTRODUODENOSCOPY (EGD) WITH PROPOFOL; N/A     Comment:  Procedure:  ESOPHAGOGASTRODUODENOSCOPY (EGD) WITH               PROPOFOL;  Surgeon: Lucilla Lame, MD;  Location: [facility]               ENDOSCOPY;  Service: Endoscopy;  Laterality: N/A; No date: KNEE SURGERY; Left No date: LAPAROSCOPIC GASTRIC RESTRICTIVE DUODENAL PROCEDURE  (DUODENAL SWITCH) 07/28/2015: POLYPECTOMY     Comment:  Procedure: POLYPECTOMY;  Surgeon: Lucilla Lame, MD;                Location: [place];  Service: Endoscopy;; No date: ROTATOR CUFF REPAIR; Right   Reproductive/Obstetrics negative OB ROS                             Anesthesia Physical  Anesthesia Plan  ASA: III  Anesthesia Plan: General   Post-op Pain Management:    Induction: Intravenous  PONV Risk Score and Plan: 2 and Ondansetron, Dexamethasone and Treatment may vary due to age or medical condition  Airway Management Planned: LMA  Additional Equipment:   Intra-op Plan:   Post-operative Plan: Extubation in OR  Informed Consent: I have reviewed the patients History and Physical, chart, labs and discussed the procedure including the risks, benefits and alternatives for the proposed anesthesia with the patient or authorized representative who has indicated his/her understanding and acceptance.     Dental Advisory Given  Plan  Discussed with: CRNA  Anesthesia Plan Comments:         Anesthesia Quick Evaluation

## 2020-06-03 NOTE — Interval H&P Note (Signed)
History and Physical Interval Note:  All questions were answered and he desires to proceed  06/03/2020 1:30 PM  Dustin Estrada  has presented today for surgery, with the diagnosis of Penile infection.  The various methods of treatment have been discussed with the patient and family. After consideration of risks, benefits and other options for treatment, the patient has consented to  Procedure(s): REMOVAL OF PENILE PROSTHESIS (N/A) as a surgical intervention.  The patient's history has been reviewed, patient examined, no change in status, stable for surgery.  I have reviewed the patient's chart and labs.  Questions were answered to the patient's satisfaction.     [place]y

## 2020-06-03 NOTE — Progress Notes (Signed)
PROGRESS NOTE    Dustin Estrada  GXQ:[id number] DOB: 04-Nov-1945 DOA: 06/02/2020 PCP: [name], MD   Brief Narrative: Taken from H&P. Dustin Estrada is a 75 y.o. male with medical history significant for penile implant, type 2 diabetes, essential hypertension, chronic A. fib on Coumadin who presented to [facility] ED due to worsening scrotal swelling and discharge.  Onset on Thursday afternoon, 5 days prior to presentation.  Associated with erythema and pain with ambulation.  On Friday and Saturday his underwears were soaked with purulent discharge.  Had a fever of 102 on Saturday and on Sunday a fever of 101, took Tylenol.  Due to worsening symptoms his wife made him come to the ED.  CT abdomen and pelvis with contrast in the ED revealed 2 complex fluid collection in the scrotum which are separate from the testes and surrounding the reservoir and tubing for the patient's penile prosthesis.  Started on IV antibiotics empirically in the ED.  Urology was consulted by EDP. Going to the OR today for removal of penile implant. Antibiotics switched to vancomycin and cefepime, preliminary blood cultures with staph species.  Subjective: Patient continues to have some scrotal pain.  No other complaint.  Waiting for procedure later today.  Assessment & Plan:   Active Problems:   Scrotal infection  Sepsis secondary to scrotal infection, cellulitis, with concern for infected penile prosthesis. Met sepsis criteria with being febrile, leukocytosis, AKI and concern of infected penile prosthesis. Started on Zosyn and vancomycin.  Preliminary blood culture results with staph species. Going to OR with urology later today for removal of prosthesis and culture. -Switch Zosyn with cefepime. -Continue with vancomycin.-We will de-escalate once more culture results are available, also waiting on intraoperative cultures. -Continue with supportive care.  UTI??  UA positive for pyuria, not sure whether from urine or  from his scrotal discharge. -Follow-up urine cultures -Continue cefepime and vancomycin for now.  Chronic A. fib.  INR at 2.4 this morning.  Patient was on Coumadin at home which is being held for surgery.  Rate well controlled. -Resume Coumadin once cleared from surgeon.  AKI.  Most likely secondary to dehydration and sepsis.  Creatinine seems stable at 1.3 with baseline of 1. -Increase the rate of IV fluid to 75 mill per hour. -Monitor renal function -Avoid nephrotoxins  Transaminitis.  CT abdomen and pelvis without any acute abnormalities in liver.  Most likely secondary to dehydration and sepsis.  Started improving. -Monitor liver function -Keep holding home dose of Lipitor  Essential hypertension.  Blood pressure within goal. -Keep holding home dose of losartan due to AKI. -Monitor blood pressure.  Objective: Vitals:   06/03/20 0002 06/03/20 0459 06/03/20 0829 06/03/20 1205  BP: (!) 116/46 107/64 109/64 124/62  Pulse: 63 (!) 54 62 61  Resp: $Remo'18 20 16 16  'eoWyv$ Temp: 98.4 F (36.9 C) 98.2 F (36.8 C) 98.6 F (37 C) 98.8 F (37.1 C)  TempSrc:  Oral Oral Tympanic  SpO2: 99% 100% 95% 99%  Weight:    97.5 kg  Height:    '5\' 8"'$  (1.727 m)    Intake/Output Summary (Last 24 hours) at 06/03/2020 1244 Last data filed at 06/03/2020 0900 Gross per 24 hour  Intake 1142.73 ml  Output 350 ml  Net 792.73 ml   Filed Weights   06/02/20 0857 06/03/20 1205  Weight: 97.5 kg 97.5 kg    Examination:  General exam: Appears calm and comfortable  Respiratory system: Clear to auscultation. Respiratory effort normal.  Cardiovascular system: S1 & S2 heard, RRR.  Gastrointestinal system: Soft, nontender, nondistended, bowel sounds positive. GU.  Significant scrotal edema and erythema. Central nervous system: Alert and oriented. No focal neurological deficits. Extremities: No edema, no cyanosis, pulses intact and symmetrical. Psychiatry: Judgement and insight appear normal. Mood & affect  appropriate.    DVT prophylaxis: SCDs Code Status: Full Family Communication: Discussed with patient Disposition Plan:  Status is: Inpatient  Remains inpatient appropriate because:Inpatient level of care appropriate due to severity of illness   Dispo:  Patient From: Home  Planned Disposition: Home  Medically stable for discharge: No   Level of care: Med-Surg  All the records are reviewed and case discussed with Care Management/Social Worker. Management plans discussed with the patient, nursing and they are in agreement.  Consultants:   Urology  Procedures:  Antimicrobials:  Cefepime Vancomycin  Data Reviewed: I have personally reviewed following labs and imaging studies  CBC: Recent Labs  Lab 06/02/20 0910 06/03/20 0434  WBC 13.1* 10.8*  NEUTROABS 11.2* 8.3*  HGB 13.5 12.3*  HCT 40.0 36.6*  MCV 94.6 94.8  PLT 207 034   Basic Metabolic Panel: Recent Labs  Lab 06/02/20 0910 06/03/20 0434  NA 134* 138  K 3.8 4.4  CL 99 104  CO2 22 26  GLUCOSE 217* 167*  BUN 21 22  CREATININE 1.31* 1.33*  CALCIUM 9.0 8.9  MG  --  1.8  PHOS  --  4.5   GFR: Estimated Creatinine Clearance: 56.8 mL/min (A) (by C-G formula based on SCr of 1.33 mg/dL (H)). Liver Function Tests: Recent Labs  Lab 06/02/20 0910 06/03/20 0434  AST 61* 57*  ALT 52* 63*  ALKPHOS 67 64  BILITOT 1.8* 1.4*  PROT 7.6 6.6  ALBUMIN 3.6 3.0*   No results for input(s): LIPASE, AMYLASE in the last 168 hours. No results for input(s): AMMONIA in the last 168 hours. Coagulation Profile: Recent Labs  Lab 06/03/20 0112 06/03/20 0434 06/03/20 1154  INR 2.5* 2.4* 2.2*   Cardiac Enzymes: No results for input(s): CKTOTAL, CKMB, CKMBINDEX, TROPONINI in the last 168 hours. BNP (last 3 results) No results for input(s): PROBNP in the last 8760 hours. HbA1C: Recent Labs    06/03/20 0434  HGBA1C 6.8*   CBG: Recent Labs  Lab 06/02/20 1202 06/02/20 1707 06/02/20 2152 06/03/20 0339  06/03/20 0831  GLUCAP 178* 215* 161* 155* 176*   Lipid Profile: No results for input(s): CHOL, HDL, LDLCALC, TRIG, CHOLHDL, LDLDIRECT in the last 72 hours. Thyroid Function Tests: No results for input(s): TSH, T4TOTAL, FREET4, T3FREE, THYROIDAB in the last 72 hours. Anemia Panel: No results for input(s): VITAMINB12, FOLATE, FERRITIN, TIBC, IRON, RETICCTPCT in the last 72 hours. Sepsis Labs: Recent Labs  Lab 06/02/20 0910  LATICACIDVEN 1.9    Recent Results (from the past 240 hour(s))  MRSA PCR Screening     Status: None   Collection Time: 06/02/20  6:00 AM   Specimen: Nasopharyngeal  Result Value Ref Range Status   MRSA by PCR NEGATIVE NEGATIVE Final    Comment:        The GeneXpert MRSA Assay (FDA approved for NASAL specimens only), is one component of a comprehensive MRSA colonization surveillance program. It is not intended to diagnose MRSA infection nor to guide or monitor treatment for MRSA infections. Performed at [facility], [address] [place], [place] [address]   Blood culture (routine x 2)     Status: None (Preliminary result)   Collection Time:  06/02/20 11:00 AM   Specimen: BLOOD  Result Value Ref Range Status   Specimen Description BLOOD BLOOD RIGHT HAND  Final   Special Requests   Final    BOTTLES DRAWN AEROBIC AND ANAEROBIC Blood Culture results may not be optimal due to an inadequate volume of blood received in culture bottles   Culture   Final    NO GROWTH < 24 HOURS Performed at [facility], [address] [place], [place] [address]    Report Status PENDING  Incomplete  Blood culture (routine x 2)     Status: None (Preliminary result)   Collection Time: 06/02/20 11:49 AM   Specimen: BLOOD  Result Value Ref Range Status   Specimen Description BLOOD LEFT ANTECUBITAL  Final   Special Requests   Final    BOTTLES DRAWN AEROBIC AND ANAEROBIC Blood Culture adequate volume   Culture  Setup Time   Final    Organism ID to  follow GRAM POSITIVE COCCI IN BOTH AEROBIC AND ANAEROBIC BOTTLES CRITICAL RESULT CALLED TO, READ BACK BY AND VERIFIED WITH: ROB HENGSTAMEN AT 0272 06/03/20 [place] Performed at [facility], [address] [place], [place] [address]    Culture GRAM POSITIVE COCCI  Final   Report Status PENDING  Incomplete  Blood Culture ID Panel (Reflexed)     Status: Abnormal   Collection Time: 06/02/20 11:49 AM  Result Value Ref Range Status   Enterococcus faecalis NOT DETECTED NOT DETECTED Final   Enterococcus Faecium NOT DETECTED NOT DETECTED Final   Listeria monocytogenes NOT DETECTED NOT DETECTED Final   Staphylococcus species DETECTED (A) NOT DETECTED Final    Comment: CRITICAL RESULT CALLED TO, READ BACK BY AND VERIFIED WITH:  ROB HENGSTAMAN AT 4034 06/03/20 SDR    Staphylococcus aureus (BCID) NOT DETECTED NOT DETECTED Final   Staphylococcus epidermidis NOT DETECTED NOT DETECTED Final   Staphylococcus lugdunensis NOT DETECTED NOT DETECTED Final   Streptococcus species NOT DETECTED NOT DETECTED Final   Streptococcus agalactiae NOT DETECTED NOT DETECTED Final   Streptococcus pneumoniae NOT DETECTED NOT DETECTED Final   Streptococcus pyogenes NOT DETECTED NOT DETECTED Final   A.calcoaceticus-baumannii NOT DETECTED NOT DETECTED Final   Bacteroides fragilis NOT DETECTED NOT DETECTED Final   Enterobacterales NOT DETECTED NOT DETECTED Final   Enterobacter cloacae complex NOT DETECTED NOT DETECTED Final   Escherichia coli NOT DETECTED NOT DETECTED Final   Klebsiella aerogenes NOT DETECTED NOT DETECTED Final   Klebsiella oxytoca NOT DETECTED NOT DETECTED Final   Klebsiella pneumoniae NOT DETECTED NOT DETECTED Final   Proteus species NOT DETECTED NOT DETECTED Final   Salmonella species NOT DETECTED NOT DETECTED Final   Serratia marcescens NOT DETECTED NOT DETECTED Final   Haemophilus influenzae NOT DETECTED NOT DETECTED Final   Neisseria meningitidis NOT DETECTED NOT DETECTED Final    Pseudomonas aeruginosa NOT DETECTED NOT DETECTED Final   Stenotrophomonas maltophilia NOT DETECTED NOT DETECTED Final   Candida albicans NOT DETECTED NOT DETECTED Final   Candida auris NOT DETECTED NOT DETECTED Final   Candida glabrata NOT DETECTED NOT DETECTED Final   Candida krusei NOT DETECTED NOT DETECTED Final   Candida parapsilosis NOT DETECTED NOT DETECTED Final   Candida tropicalis NOT DETECTED NOT DETECTED Final   Cryptococcus neoformans/gattii NOT DETECTED NOT DETECTED Final    Comment: Performed at [facility], [address] [place],  [address]  Resp Panel by RT-PCR (Flu A&B, Covid) Nasopharyngeal Swab     Status: None   Collection Time:  06/02/20 12:12 PM   Specimen: Nasopharyngeal Swab; Nasopharyngeal(NP) swabs in vial transport medium  Result Value Ref Range Status   SARS Coronavirus 2 by RT PCR NEGATIVE NEGATIVE Final    Comment: (NOTE) SARS-CoV-2 target nucleic acids are NOT DETECTED.  The SARS-CoV-2 RNA is generally detectable in upper respiratory specimens during the acute phase of infection. The lowest concentration of SARS-CoV-2 viral copies this assay can detect is 138 copies/mL. A negative result does not preclude SARS-Cov-2 infection and should not be used as the sole basis for treatment or other patient management decisions. A negative result may occur with  improper specimen collection/handling, submission of specimen other than nasopharyngeal swab, presence of viral mutation(s) within the areas targeted by this assay, and inadequate number of viral copies(<138 copies/mL). A negative result must be combined with clinical observations, patient history, and epidemiological information. The expected result is Negative.  Fact Sheet for Patients:  EntrepreneurPulse.com.au  Fact Sheet for Healthcare Providers:  IncredibleEmployment.be  This test is no t yet approved or cleared by the [place] FDA and   has been authorized for detection and/or diagnosis of SARS-CoV-2 by FDA under an Emergency Use Authorization (EUA). This EUA will remain  in effect (meaning this test can be used) for the duration of the COVID-19 declaration under Section 564(b)(1) of the Act, 21 U.S.C.section 360bbb-3(b)(1), unless the authorization is terminated  or revoked sooner.       Influenza A by PCR NEGATIVE NEGATIVE Final   Influenza B by PCR NEGATIVE NEGATIVE Final    Comment: (NOTE) The Xpert Xpress SARS-CoV-2/FLU/RSV plus assay is intended as an aid in the diagnosis of influenza from Nasopharyngeal swab specimens and should not be used as a sole basis for treatment. Nasal washings and aspirates are unacceptable for Xpert Xpress SARS-CoV-2/FLU/RSV testing.  Fact Sheet for Patients: EntrepreneurPulse.com.au  Fact Sheet for Healthcare Providers: IncredibleEmployment.be  This test is not yet approved or cleared by the [place] FDA and has been authorized for detection and/or diagnosis of SARS-CoV-2 by FDA under an Emergency Use Authorization (EUA). This EUA will remain in effect (meaning this test can be used) for the duration of the COVID-19 declaration under Section 564(b)(1) of the Act, 21 U.S.C. section 360bbb-3(b)(1), unless the authorization is terminated or revoked.  Performed at [facility], [address], [place], [place] [address]      Radiology Studies: CT ABDOMEN PELVIS W CONTRAST  Result Date: 06/02/2020 CLINICAL DATA:  Scrotal swelling for 4 days. History of penile implant times 10 years with abnormal scrotal ultrasound. Abdominal abscess/infection suspected. History of cholecystectomy, hiatal hernia and gastric surgeries in 2017. EXAM: CT ABDOMEN AND PELVIS WITH CONTRAST TECHNIQUE: Multidetector CT imaging of the abdomen and pelvis was performed using the standard protocol following bolus administration of intravenous contrast.  CONTRAST:  16mL OMNIPAQUE IOHEXOL 300 MG/ML  SOLN COMPARISON:  Scrotal ultrasound same date. FINDINGS: Lower chest: Clear lung bases. No significant pleural or pericardial effusion. Hepatobiliary: The liver is normal in density without suspicious focal abnormality. No significant biliary dilatation post cholecystectomy. Pancreas: Unremarkable. No pancreatic ductal dilatation or surrounding inflammatory changes. Spleen: Normal in size without focal abnormality. Adrenals/Urinary Tract: Both adrenal glands appear normal. There are small nonobstructing bilateral renal calculi with mild nonspecific symmetric perinephric soft tissue stranding bilaterally. No evidence of ureteral calculus or hydronephrosis. There is a small cyst in the interpolar region of the right kidney. The bladder appears unremarkable. Stomach/Bowel: No enteric contrast administered. There are postsurgical changes in the stomach  consistent with partial gastrectomy and gastric bypass procedure. No evidence of bowel wall thickening, distention or surrounding inflammation. The appendix appears normal. There are mild diverticular changes in the sigmoid colon. Vascular/Lymphatic: There are no enlarged abdominal or pelvic lymph nodes. Mild aortic and branch vessel atherosclerosis. No acute vascular findings. Reproductive: The prostate gland is mildly enlarged. Patient has a penile prosthesis, the reservoir for which appears to be located posteriorly in the scrotum. The reservoir appears decompressed with a surrounding fluid collection measuring 8.1 x 6.0 x 6.2 cm. This collection demonstrates internal air bubbles and peripheral enhancement, suspicious for an infected fluid collection. There is an additional complex fluid collection more anteriorly in the scrotum surrounding the tubing of the penile prosthesis, measuring 7.9 x 6.2 x 9.3 cm. This also demonstrates internal air bubbles and peripheral enhancement. Separate from these collections are small  bilateral hydroceles as seen on preceding ultrasound. No inflammatory changes identified in the pelvis. Other: Subcutaneous edema in the anterior abdominal wall, likely related to subcutaneous injections. Small left inguinal hernia containing only fat. No ascites. Musculoskeletal: No acute or significant osseous findings. Multilevel lumbar spondylosis. Bilateral L5 pars defects with grade 1 anterolisthesis and moderate foraminal narrowing bilaterally at L5-S1. Probable hemangioma in the L1 vertebral body. IMPRESSION: 1. There are 2 complex fluid collections in the scrotum which are separate from the testes and surrounding the reservoir and tubing for the patient's penile prosthesis. Both of these collections demonstrate internal air bubbles and rim enhancement suspected for infected fluid. Urology consultation recommended. 2. No evidence of intrapelvic or generalized peritoneal inflammation. 3. Nonobstructing bilateral renal calculi. 4. Bilateral L5 pars defects with resulting grade 1 anterolisthesis and moderate biforaminal stenosis at L5-S1. 5.  Aortic Atherosclerosis (ICD10-I70.0). Electronically Signed   By: Richardean Sale M.D.   On: 06/02/2020 11:45   US SCROTUM W/DOPPLER  Result Date: 06/02/2020 CLINICAL DATA:  Scrotal infection. EXAM: SCROTAL ULTRASOUND DOPPLER ULTRASOUND OF THE TESTICLES TECHNIQUE: Complete ultrasound examination of the testicles, epididymis, and other scrotal structures was performed. Color and spectral Doppler ultrasound were also utilized to evaluate blood flow to the testicles. COMPARISON:  None. FINDINGS: Right testicle Measurements: 53 x 20 x 30 mm.  No mass or abnormal vascularity Left testicle Measurements:  45 x 26 x 32 mm.  No mass or abnormal vascularity Right epididymis:  Normal in size and appearance. Left epididymis:  Normal in size and appearance. Hydrocele:  Simple hydrocele. Varicocele:  None visualized. Pulsed Doppler interrogation of both testes demonstrates normal  low resistance arterial and venous waveforms bilaterally. Other: Penile implant with mid level echoes encapsulated around the tubing in the perineum. Gas is also present and likely within the implant reservoir or, CT would better define the extent and location of fluid/gas in and out of the implant. IMPRESSION: 1. Fluid collection around penile implant hardware, recommend CT with contrast. 2. Negative testicles. Electronically Signed   By: Monte Fantasia M.D.   On: 06/02/2020 10:42    Scheduled Meds: . [MAR Hold] insulin aspart  0-5 Units Subcutaneous QHS  . [MAR Hold] insulin aspart  0-9 Units Subcutaneous TID WC  . [MAR Hold] senna  1 tablet Oral QHS   Continuous Infusions: . sodium chloride 75 mL/hr at 06/03/20 1230  . [MAR Hold] ceFEPime (MAXIPIME) IV    . piperacillin-tazobactam    . [MAR Hold] vancomycin       LOS: 1 day   Time spent: 40 minutes. More than 50% of the time was spent in counseling/coordination  of care  Lorella Nimrod, MD [facility]  If 7PM-7AM, please contact night-coverage [email or web address]  06/03/2020, 12:44 PM   This record has been created using Systems analyst. Errors have been sought and corrected,but may not always be located. Such creation errors do not reflect on the standard of care.

## 2020-06-03 NOTE — Transfer of Care (Signed)
Immediate Anesthesia Transfer of Care Note  Patient: Dustin Estrada  Procedure(s) Performed: REMOVAL OF PENILE PROSTHESIS (N/A ) CYSTOSCOPY FLEXIBLE  Patient Location: PACU  Anesthesia Type:General  Level of Consciousness: awake, drowsy and patient cooperative  Airway & Oxygen Therapy: Patient Spontanous Breathing  Post-op Assessment: Report given to RN and Post -op Vital signs reviewed and stable  Post vital signs: Reviewed and stable  Last Vitals:  Vitals Value Taken Time  BP 145/72 06/03/20 1534  Temp 36.5 C 06/03/20 1538  Pulse 82 06/03/20 1540  Resp 19 06/03/20 1540  SpO2 96 % 06/03/20 1540  Vitals shown include unvalidated device data.  Last Pain:  Vitals:   06/03/20 1538  TempSrc:   PainSc: 0-No pain         Complications: No complications documented.

## 2020-06-03 NOTE — Progress Notes (Signed)
PHARMACY - PHYSICIAN COMMUNICATION CRITICAL VALUE ALERT - BLOOD CULTURE IDENTIFICATION (BCID)  Dustin Estrada is an 75 y.o. 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, 4/18 blood culture with GPC 1/2 sets (both bottles of the one set), BCID = staphylococcus species (not aureus, epidermidis, lugdunensis)  Name of physician (or Provider) Contacted: Dr Reesa Chew  Current antibiotics: vancomycin and zosyn  Changes to prescribed antibiotics recommended:  Recommendations accepted by provider - continue vancomycin, zosyn to cefepime  Results for orders placed or performed during the hospital encounter of 06/02/20  Blood Culture ID Panel (Reflexed) (Collected: 06/02/2020 11:49 AM)  Result Value Ref Range   Enterococcus faecalis NOT DETECTED NOT DETECTED   Enterococcus Faecium NOT DETECTED NOT DETECTED   Listeria monocytogenes NOT DETECTED NOT DETECTED   Staphylococcus species DETECTED (A) NOT DETECTED   Staphylococcus aureus (BCID) NOT DETECTED NOT DETECTED   Staphylococcus epidermidis NOT DETECTED NOT DETECTED   Staphylococcus lugdunensis NOT DETECTED NOT DETECTED   Streptococcus species NOT DETECTED NOT DETECTED   Streptococcus agalactiae NOT DETECTED NOT DETECTED   Streptococcus pneumoniae NOT DETECTED NOT DETECTED   Streptococcus pyogenes NOT DETECTED NOT DETECTED   A.calcoaceticus-baumannii NOT DETECTED NOT DETECTED   Bacteroides fragilis NOT DETECTED NOT DETECTED   Enterobacterales NOT DETECTED NOT DETECTED   Enterobacter cloacae complex NOT DETECTED NOT DETECTED   Escherichia coli NOT DETECTED NOT DETECTED   Klebsiella aerogenes NOT DETECTED NOT DETECTED   Klebsiella oxytoca NOT DETECTED NOT DETECTED   Klebsiella pneumoniae NOT DETECTED NOT DETECTED   Proteus species NOT DETECTED NOT DETECTED   Salmonella species NOT DETECTED NOT DETECTED   Serratia marcescens NOT  DETECTED NOT DETECTED   Haemophilus influenzae NOT DETECTED NOT DETECTED   Neisseria meningitidis NOT DETECTED NOT DETECTED   Pseudomonas aeruginosa NOT DETECTED NOT DETECTED   Stenotrophomonas maltophilia NOT DETECTED NOT DETECTED   Candida albicans NOT DETECTED NOT DETECTED   Candida auris NOT DETECTED NOT DETECTED   Candida glabrata NOT DETECTED NOT DETECTED   Candida krusei NOT DETECTED NOT DETECTED   Candida parapsilosis NOT DETECTED NOT DETECTED   Candida tropicalis NOT DETECTED NOT DETECTED   Cryptococcus neoformans/gattii NOT DETECTED NOT DETECTED    Doreene Eland, PharmD, BCPS.   Work Cell: [phone] 06/03/2020 9:09 AM

## 2020-06-03 NOTE — Op Note (Signed)
Preoperative diagnosis:  1. Infected multicomponent penile prosthesis  Postoperative diagnosis:  1. Infected multicomponent penile prosthesis 2. Urethral erosion left corporal penile prosthesis  Procedure: 1. Cystoscopy 2. Removal multicomponent penile prosthesis 3. Incision and drainage scrotal abscesses  Surgeon: Abbie Sons, MD  Anesthesia: General  Complications: None  Intraoperative findings:  1. Cystoscopy-at fossa navicularis just inside meatus on left the left cylinder was visualized.  Otherwise urethra normal in caliber without stricture.  Moderate lateral lobe enlargement.  Bladder mucosa without erythema, solid or papillary lesions 2. Large anterior right hemiscrotal abscess >300 mL of thick, purulent fluid with abscess cavity containing scrotal pump 3. Large posterior right hemiscrotal abscess with ~ 150 mL thick purulent fluid containing a malpositioned reservoir  EBL: 50 mL  Specimens:   Wound cultures  Multicomponent penile prosthesis  Indication: Dustin Estrada is a 75 y.o. male who presented to the ED 4/18 with a 3-4-day history of scrotal discomfort, swelling, erythema, purulent urethral discharge and fever to 102 degrees.  CT showed anterior and posterior fluid collections surrounding the scrotal pump and a malpositioned intrascrotal reservoir.  After reviewing the management options for treatment, he elected to proceed with the above surgical procedure(s). We have discussed the potential benefits and risks of the procedure, side effects of the proposed treatment, the likelihood of the patient achieving the goals of the procedure, and any potential problems that might occur during the procedure or recuperation. Informed consent has been obtained.  Description of procedure:  The patient was taken to the operating room and general anesthesia was induced.  The patient was placed in the dorsal lithotomy position, prepped and draped in the usual sterile fashion,  and preoperative antibiotics were administered. A preoperative time-out was performed.   A significant amount of purulent drainage was noted per urethra and flexible cystoscopy was performed with findings as described above.  A 20 French Foley catheter was placed to gravity drainage after cystoscope removal.  A 6 cm longitudinal scrotal incision was made in the median raphae starting at the penoscrotal junction.  This was carried through the dartos with cautery down to a thick-walled fluid-filled mass.  The thickened wall was incised with a scalpel with return of copious purulent fluid as described above.  Wound cultures were obtained.  The scrotal pump was brought out through the abscess cavity.  A portion of the inflammatory rind was excised with cautery.  Traction was placed on the tubing to the reservoir and was further opened with cautery.  The reservoir was drained and removed.  Large amount of purulent fluid was again suctioned.  Attention was then directed to the tubing of the left corporal cylinder.  The left corpus was identified after dividing a significant amount of inflammatory tissue with cautery.  An incision was not required in the corpus.  A right angle was inserted after deflation of the fluid-filled cylinders and removed without difficulty with a rear-tip extender intact.  Attention was then directed to the right corporal cylinder and the corpus was exposed in a similar fashion with removal of the right cylinder.  The right rear-tip extender remained however was easily palpated and grasped with a hemostat and removed.  The abscess cavities and corpora were copiously irrigated with saline.  No bleeding was noted from the corpora and no closure was attempted.  The posterior wall of the anterior abscess cavity was divided with cautery to make both abscess cavities contiguous.  The operative site was closely examined for hemostasis which was  adequate.  The testes were palpated and fairly and  not involved.  The abscess cavities and penoscrotal area were packed with Kerlix and a dressing of fluffs, ABD and mesh underwear was applied.  After anesthetic reversal the patient was transported to PACU in stable condition.  Plan:  Dressing change by urology tomorrow  Continue IV antibiotic   Abbie Sons, M.D.

## 2020-06-04 ENCOUNTER — Encounter: Payer: Self-pay | Admitting: Urology

## 2020-06-04 DIAGNOSIS — T8361XA Infection and inflammatory reaction due to implanted penile prosthesis, initial encounter: Principal | ICD-10-CM

## 2020-06-04 DIAGNOSIS — N492 Inflammatory disorders of scrotum: Secondary | ICD-10-CM | POA: Diagnosis not present

## 2020-06-04 LAB — GLUCOSE, CAPILLARY
Glucose-Capillary: 220 mg/dL — ABNORMAL HIGH (ref 70–99)
Glucose-Capillary: 220 mg/dL — ABNORMAL HIGH (ref 70–99)
Glucose-Capillary: 240 mg/dL — ABNORMAL HIGH (ref 70–99)
Glucose-Capillary: 228 mg/dL — ABNORMAL HIGH (ref 70–99)
Glucose-Capillary: 218 mg/dL — ABNORMAL HIGH (ref 70–99)

## 2020-06-04 LAB — URINE CULTURE: Culture: NO GROWTH

## 2020-06-04 LAB — PROTIME-INR
INR: 1.9 — ABNORMAL HIGH (ref 0.8–1.2)
Prothrombin Time: 21.9 seconds — ABNORMAL HIGH (ref 11.4–15.2)

## 2020-06-04 MED ORDER — INSULIN GLARGINE 100 UNIT/ML ~~LOC~~ SOLN
10.0000 [IU] | Freq: Every day | SUBCUTANEOUS | Status: DC
  Administered 2020-06-04 – 2020-06-05 (×2): 10 [IU] via SUBCUTANEOUS
  Filled 2020-06-04 (×3): qty 0.1

## 2020-06-04 MED ORDER — INSULIN ASPART 100 UNIT/ML ~~LOC~~ SOLN
4.0000 [IU] | Freq: Three times a day (TID) | SUBCUTANEOUS | Status: DC
  Administered 2020-06-04 – 2020-06-06 (×6): 4 [IU] via SUBCUTANEOUS
  Filled 2020-06-04 (×7): qty 1

## 2020-06-04 MED ORDER — WARFARIN SODIUM 5 MG PO TABS
5.0000 mg | ORAL_TABLET | Freq: Once | ORAL | Status: AC
  Administered 2020-06-04: 5 mg via ORAL
  Filled 2020-06-04: qty 1

## 2020-06-04 MED ORDER — WARFARIN - PHARMACIST DOSING INPATIENT: Freq: Every day | Status: DC

## 2020-06-04 NOTE — Progress Notes (Addendum)
Urology Inpatient Progress Note  Subjective: No acute events overnight. Wound culture pending with abundant GNR's and WBCs.  On antibiotics as below.  1/2 admission blood cultures positive for Staphylococcus, consistent with skin contaminant.  On antibiotics as below Foley catheter in place draining clear, yellow urine. Patient reports pain is well controlled with no acute concerns today.  Anti-infectives: Anti-infectives (From admission, onward)   Start     Dose/Rate Route Frequency Ordered Stop   06/03/20 1400  ceFEPIme (MAXIPIME) 2 g in sodium chloride 0.9 % 100 mL IVPB        2 g 200 mL/hr over 30 Minutes Intravenous Every 12 hours 06/03/20 0837     06/03/20 1400  vancomycin (VANCOREADY) IVPB 1500 mg/300 mL        1,500 mg 150 mL/hr over 120 Minutes Intravenous Every 24 hours 06/03/20 0837     06/03/20 1203  piperacillin-tazobactam (ZOSYN) 3.375 GM/50ML IVPB  Status:  Discontinued       Note to Pharmacy: [place], [place]   : cabinet override      06/03/20 1203 06/03/20 1335   06/02/20 1400  vancomycin (VANCOCIN) IVPB 1000 mg/200 mL premix  Status:  Discontinued        1,000 mg 200 mL/hr over 60 Minutes Intravenous Every 24 hours 06/02/20 1329 06/03/20 0837   06/02/20 1400  piperacillin-tazobactam (ZOSYN) IVPB 3.375 g  Status:  Discontinued        3.375 g 12.5 mL/hr over 240 Minutes Intravenous Every 8 hours 06/02/20 1333 06/03/20 0833   06/02/20 1245  vancomycin (VANCOCIN) IVPB 1000 mg/200 mL premix        1,000 mg 200 mL/hr over 60 Minutes Intravenous  Once 06/02/20 1233 06/02/20 1402   06/02/20 1100  ceFEPIme (MAXIPIME) 2 g in sodium chloride 0.9 % 100 mL IVPB        2 g 200 mL/hr over 30 Minutes Intravenous  Once 06/02/20 1048 06/02/20 1300      Current Facility-Administered Medications  Medication Dose Route Frequency Provider Last Rate Last Admin  . 0.9 %  sodium chloride infusion   Intravenous Continuous Stoioff, Ronda Fairly, MD   Stopped at 06/03/20 1528  . acetaminophen  (TYLENOL) tablet 650 mg  650 mg Oral Q6H PRN Stoioff, Scott C, MD   650 mg at 06/03/20 1110  . ceFEPIme (MAXIPIME) 2 g in sodium chloride 0.9 % 100 mL IVPB  2 g Intravenous Q12H Stoioff, Scott C, MD 200 mL/hr at 06/04/20 0846 2 g at 06/04/20 0846  . Chlorhexidine Gluconate Cloth 2 % PADS 6 each  6 each Topical Daily Stoioff, Ronda Fairly, MD   6 each at 06/03/20 2220  . HYDROmorphone (DILAUDID) injection 0.5 mg  0.5 mg Intravenous Q4H PRN Stoioff, Scott C, MD      . insulin aspart (novoLOG) injection 0-5 Units  0-5 Units Subcutaneous QHS Abbie Sons, MD   4 Units at 06/03/20 2215  . insulin aspart (novoLOG) injection 0-9 Units  0-9 Units Subcutaneous TID WC Stoioff, Ronda Fairly, MD   3 Units at 06/04/20 0848  . insulin aspart (novoLOG) injection 4 Units  4 Units Subcutaneous TID WC Nicole Kindred A, DO      . insulin glargine (LANTUS) injection 10 Units  10 Units Subcutaneous QHS Nicole Kindred A, DO      . melatonin tablet 5 mg  5 mg Oral QHS PRN Stoioff, Ronda Fairly, MD   5 mg at 06/03/20 2216  . metoprolol tartrate (LOPRESSOR) injection 2.5  mg  2.5 mg Intravenous Q4H PRN Stoioff, Scott C, MD      . ondansetron (ZOFRAN) injection 4 mg  4 mg Intravenous Q6H PRN Stoioff, Scott C, MD      . oxyCODONE (Oxy IR/ROXICODONE) immediate release tablet 5 mg  5 mg Oral Q4H PRN Stoioff, Scott C, MD      . senna (SENOKOT) tablet 8.6 mg  1 tablet Oral QHS Stoioff, Scott C, MD   8.6 mg at 06/03/20 2216  . vancomycin (VANCOREADY) IVPB 1500 mg/300 mL  1,500 mg Intravenous Q24H Stoioff, Scott C, MD   1,000 mg at 06/03/20 1400   Objective: Vital signs in last 24 hours: Temp:  [97 F (36.1 C)-98.8 F (37.1 C)] 97.8 F (36.6 C) (04/20 0824) Pulse Rate:  [51-82] 51 (04/20 0824) Resp:  [16-26] 18 (04/20 0332) BP: (118-148)/(62-94) 140/79 (04/20 0824) SpO2:  [94 %-100 %] 100 % (04/20 0824) Weight:  [97.5 kg] 97.5 kg (04/19 1205)  Intake/Output from previous day: 04/19 0701 - 04/20 0700 In: [place] [P.O.:120; I.V.:1500;  IV Piggyback:200] Out: 1710 [Urine:1710] Intake/Output this shift: No intake/output data recorded.  Physical Exam Vitals and nursing note reviewed.  Constitutional:      General: He is not in acute distress.    Appearance: He is not ill-appearing, toxic-appearing or diaphoretic.  HENT:     Head: Normocephalic and atraumatic.  Pulmonary:     Effort: Pulmonary effort is normal. No respiratory distress.  Genitourinary:    Comments: Scrotal dressing removed at the bedside to reveal healthy granulation tissue throughout the wound bed without residual purulence or necrosis.  Wound draining an appropriate volume of serosanguinous fluid.  Wound noted to have both a posterior and anterior portion. Skin:    General: Skin is warm and dry.  Neurological:     Mental Status: He is alert and oriented to person, place, and time.  Psychiatric:        Mood and Affect: Mood normal.        Behavior: Behavior normal.    Lab Results:  Recent Labs    06/02/20 0910 06/03/20 0434  WBC 13.1* 10.8*  HGB 13.5 12.3*  HCT 40.0 36.6*  PLT 207 193   BMET Recent Labs    06/02/20 0910 06/03/20 0434  NA 134* 138  K 3.8 4.4  CL 99 104  CO2 22 26  GLUCOSE 217* 167*  BUN 21 22  CREATININE 1.31* 1.33*  CALCIUM 9.0 8.9   PT/INR Recent Labs    06/03/20 0434 06/03/20 1154  LABPROT 25.9* 24.6*  INR 2.4* 2.2*   Assessment & Plan: 75 year old male POD 1 from cystoscopy and explant of multicomponent penile prosthesis with I&D of scrotal abscesses for management of an infected multicomponent penile prosthesis with urethral erosion of the left corporal penile prosthesis.  Patient clinically improving today.  Dr. Bernardo Heater and I exchanged his dressing at the bedside this morning and patient tolerated this well.  We took care to replace his wet-to-dry dressing and fill both the posterior and anterior portions of his wound.  No further dressing changes required today.  We will plan to perform his daily  wet-to-dry dressing change tomorrow morning.  Thereafter, okay for nursing to perform twice daily wet-to-dry dressing changes.  We suspect he may have difficulty with wound packing by himself due to the location and positioning of the wound.  Recommend educating his spouse on appropriate wound care, otherwise he may require home health nursing assistance for this.  Agree  with ongoing antibiotic management. Continue Foley catheter x1 week in the setting of urethral erosion.  Debroah Loop, PA-C 06/04/2020

## 2020-06-04 NOTE — Hospital Course (Addendum)
Dustin Estrada is a 75 y.o. male with medical history significant for penile implant, type 2 diabetes, essential hypertension, chronic A. fib on Coumadin who presented to [facility] ED due to worsening scrotal swelling and discharge.  Onset on Thursday afternoon, 5 days prior to presentation.  Associated with erythema and pain with ambulation.  On Friday and Saturday his underwears were soaked with purulent discharge.  Had a fever of 102 on Saturday and on Sunday a fever of 101, took Tylenol.  Due to worsening symptoms his wife made him come to the ED.  CT abdomen and pelvis with contrast in the ED revealed 2 complex fluid collection in the scrotum which are separate from the testes and surrounding the reservoir and tubing for the patient's penile prosthesis.  Started on IV antibiotics empirically in the ED.  Urology was consulted by EDP. Taken to the OR on 4/19 with urology for cystoscopy, removal of penile prosthesis and I&D of scrotal abscesses.  Treated with empiric broad spectrum IV vancomycin, cefepime.   2 bottles of preliminary blood culture grew staph epi and staph hominis, possibly contamination.  Repeat blood culture obtained and negative to date at time of discharge.  Abscess cultures growing morganelli spp, susceptiblities pending.  Pt clinically improved on Cefepime. Discharged with 7 additional days of Levaquin.   Follow up on final culture susceptibilities.

## 2020-06-04 NOTE — Progress Notes (Signed)
ANTICOAGULATION CONSULT NOTE - Initial Consult  Pharmacy Consult for Warfarin Indication: atrial fibrillation  No Known Allergies  Patient Measurements: Height: 5\' 8"  (172.7 cm) Weight: 97.5 kg (214 lb 15.2 oz) IBW/kg (Calculated) : 68.4 Heparin Dosing Weight:    Vital Signs: Temp: 98.5 F (36.9 C) (04/20 1207) Temp Source: Oral (04/20 0824) BP: 136/70 (04/20 1207) Pulse Rate: 57 (04/20 1207)  Labs: Recent Labs    06/02/20 0910 06/03/20 0112 06/03/20 0434 06/03/20 1154 06/04/20 1247  HGB 13.5  --  12.3*  --   --   HCT 40.0  --  36.6*  --   --   PLT 207  --  193  --   --   LABPROT  --    < > 25.9* 24.6* 21.9*  INR  --    < > 2.4* 2.2* 1.9*  CREATININE 1.31*  --  1.33*  --   --    < > = values in this interval not displayed.    Estimated Creatinine Clearance: 56.8 mL/min (A) (by C-G formula based on SCr of 1.33 mg/dL (H)).   Medical History: Past Medical History:  Diagnosis Date  . Arthritis   . Diabetes mellitus without complication ([place])    type 2  . GERD (gastroesophageal reflux disease)   . Hx of blood clots Nov 2016   lung  . Hypertension   . Shortness of breath dyspnea    on exertion  . Sleep apnea    cpap    Medications:  Scheduled:  . Chlorhexidine Gluconate Cloth  6 each Topical Daily  . insulin aspart  0-5 Units Subcutaneous QHS  . insulin aspart  0-9 Units Subcutaneous TID WC  . insulin aspart  4 Units Subcutaneous TID WC  . insulin glargine  10 Units Subcutaneous QHS  . senna  1 tablet Oral QHS   Infusions:  . ceFEPime (MAXIPIME) IV 2 g (06/04/20 0846)  . vancomycin 1,500 mg (06/04/20 1334)    Assessment: 75 yo M to resume Warfarin for Afib s/p removal of penile prosthesis and I&D of scrotal abscesses 4/19 evening. Home Warfarin dose: 2.5 mg all days except Wed, 5 mg on Wednesdays  4/18  Given Vit K 5 mg po x1 4/19 INR 2.2  Held for procedures. Given Vit K 2.5mg  PO x1 4/20 INR 1.9  Goal of Therapy:  INR 2-3 Monitor platelets by  anticoagulation protocol: Yes   Plan:  INR 1.9 barely subtherapeutic. Will order Home dose of 5 mg Warfarin x 1 today(wednesday). Vitamin K administration can delay return to therapeutic INR. F/u CBC per protocol, INR daily  , A 06/04/2020,1:45 PM

## 2020-06-04 NOTE — Progress Notes (Signed)
PROGRESS NOTE    Dustin Estrada   PBH:[id number]  DOB: 1945/08/21  PCP: [name], MD    DOA: 06/02/2020 LOS: 2   Brief Narrative   Dustin Estrada is a 75 y.o. male with medical history significant for penile implant, type 2 diabetes, essential hypertension, chronic A. fib on Coumadin who presented to [facility] ED due to worsening scrotal swelling and discharge.  Onset on Thursday afternoon, 5 days prior to presentation.  Associated with erythema and pain with ambulation.  On Friday and Saturday his underwears were soaked with purulent discharge.  Had a fever of 102 on Saturday and on $Remo"Sunday a fever of 101, took Tylenol.  Due to worsening symptoms his wife made him come to the ED.  CT abdomen and pelvis with contrast in the ED revealed 2 complex fluid collection in the scrotum which are separate from the testes and surrounding the reservoir and tubing for the patient's penile prosthesis.  Started on IV antibiotics empirically in the ED.  Urology was consulted by EDP. Taken to the OR on 4/19 with urology for cystoscopy, removal of penile prosthesis and I&D of scrotal abscesses.  On antibiotics: vancomycin and cefepime.   2 bottles of preliminary blood culture growing staph epi and staph hominis, possibly contamination.  Cultures from scrotal abscess with abundant gram-negative rods preliminarily.   Assessment & Plan   Active Problems:   Scrotal infection   Sepsis secondary to infected penile prosthesis with secondary scrotal abscess met for sepsis on admission with fever, leukocytosis, AKI consistent with organ dysfunction in the presence of new infection.   Initially started on Vanco and Zosyn.  Zosyn was transitioned to cefepime. -- DC Vanco -- Staph species on initial blood cultures suspicious for contamination -- Obtain repeat blood cultures  -- Continue cefepime -- Continue supportive care: Pain control as needed, antiemetics  ?  UTI -UA had pyuria which could be from the  scrotal infection.  Patient denies dysuria.  Urine culture with no growth.  On antibiotics as above.  Chronic A. Fib -anticoagulated on warfarin which was held for surgery yesterday.  Discussed with urology and given okay to resume warfarin. -- Monitor INRs daily --Resume warfarin, dosing by pharmacy  Acute kidney injury -present on admission likely due to sepsis and dehydration. Improving with IV hydration.  Baseline creatinine around 1. -- Stop IV fluids --Monitor renal function daily Obesity: Body mass index is 32.68 kg/m.  Complicates overall care and prognosis.  Recommend lifestyle modifications including physical activity and diet for weight loss and overall long-term health.  Transaminitis -CT abdomen pelvis negative for any acute liver abnormalities.  Suspect secondary to sepsis.  LFTs improving -- Hold Lipitor for now -- Monitor LFTs  Essential hypertension -BP controlled. -- Home losartan on hold due to AKI, may resume tomorrow but watch renal function off of fluids first -- Monitor BP   DVT prophylaxis: SCDs Start: 06/02/20 1254   Diet:  Diet Orders (From admission, onward)    Start     Ordered   06/04/20 0846  Diet heart healthy/carb modified Room service appropriate? Yes; Fluid consistency: Thin  Diet effective now       Question Answer Comment  Diet-HS Snack? Nothing   Room service appropriate? Yes   Fluid consistency: Thin      04"KQxbM$ /20/22 0846            Code Status: Full Code    Subjective 06/04/20    Patient seen awake sitting up  in bed this morning, wife at bedside.  He denies having any pain postoperatively.  Says he feels 1000% better than when he first arrived to the hospital.  Eating and drinking well.  No fevers or chills, nausea vomiting, chest pain or shortness of breath or other acute complaints.   Disposition Plan & Communication   Status is: Inpatient  Remains inpatient appropriate because:IV treatments appropriate due to intensity of  illness or inability to take PO   Dispo:  Patient From: Home  Planned Disposition: Home  Medically stable for discharge: No     Family Communication: Wife at bedside on rounds today 4/20   Consults, Procedures, Significant Events   Consultants:   Urology  Procedures:   4/19: Removal of penile prosthesis, I&D of scrotal abscesses  Antimicrobials:  Anti-infectives (From admission, onward)   Start     Dose/Rate Route Frequency Ordered Stop   06/03/20 1400  ceFEPIme (MAXIPIME) 2 g in sodium chloride 0.9 % 100 mL IVPB        2 g 200 mL/hr over 30 Minutes Intravenous Every 12 hours 06/03/20 0837     06/03/20 1400  vancomycin (VANCOREADY) IVPB 1500 mg/300 mL  Status:  Discontinued        1,500 mg 150 mL/hr over 120 Minutes Intravenous Every 24 hours 06/03/20 0837 06/04/20 1352   06/03/20 1203  piperacillin-tazobactam (ZOSYN) 3.375 GM/50ML IVPB  Status:  Discontinued       Note to Pharmacy: [place], [place]   : cabinet override      06/03/20 1203 06/03/20 1335   06/02/20 1400  vancomycin (VANCOCIN) IVPB 1000 mg/200 mL premix  Status:  Discontinued        1,000 mg 200 mL/hr over 60 Minutes Intravenous Every 24 hours 06/02/20 1329 06/03/20 0837   06/02/20 1400  piperacillin-tazobactam (ZOSYN) IVPB 3.375 g  Status:  Discontinued        3.375 g 12.5 mL/hr over 240 Minutes Intravenous Every 8 hours 06/02/20 1333 06/03/20 0833   06/02/20 1245  vancomycin (VANCOCIN) IVPB 1000 mg/200 mL premix        1,000 mg 200 mL/hr over 60 Minutes Intravenous  Once 06/02/20 1233 06/02/20 1402   06/02/20 1100  ceFEPIme (MAXIPIME) 2 g in sodium chloride 0.9 % 100 mL IVPB        2 g 200 mL/hr over 30 Minutes Intravenous  Once 06/02/20 1048 06/02/20 1300        Micro    Objective   Vitals:   06/04/20 0332 06/04/20 0824 06/04/20 1207 06/04/20 1622  BP: 118/66 140/79 136/70 (!) 141/71  Pulse: (!) 59 (!) 51 (!) 57 60  Resp: $Remo'18  18 18  'IkMto$ Temp: (!) 97.5 F (36.4 C) 97.8 F (36.6 C) 98.5 F (36.9  C)   TempSrc:  Oral    SpO2: 100% 100% 100% 98%  Weight:      Height:        Intake/Output Summary (Last 24 hours) at 06/04/2020 1720 Last data filed at 06/04/2020 1350 Gross per 24 hour  Intake 700 ml  Output 900 ml  Net -200 ml   Filed Weights   06/02/20 0857 06/03/20 1205  Weight: 97.5 kg 97.5 kg    Physical Exam:  General exam: awake, alert, no acute distress, obese HEENT: moist mucus membranes, hearing grossly normal  Respiratory system: CTAB, no wheezes, rales or rhonchi, normal respiratory effort. Cardiovascular system: normal S1/S2, RRR, no pedal edema.   Gastrointestinal system: soft, NT, ND, +bowel sounds.  Central nervous system: A&O x3. no gross focal neurologic deficits, normal speech Extremities: moves all, no edema, normal tone Psychiatry: normal mood, congruent affect, judgement and insight appear normal  Labs   Data Reviewed: I have personally reviewed following labs and imaging studies  CBC: Recent Labs  Lab 06/02/20 0910 06/03/20 0434  WBC 13.1* 10.8*  NEUTROABS 11.2* 8.3*  HGB 13.5 12.3*  HCT 40.0 36.6*  MCV 94.6 94.8  PLT 207 469   Basic Metabolic Panel: Recent Labs  Lab 06/02/20 0910 06/03/20 0434  NA 134* 138  K 3.8 4.4  CL 99 104  CO2 22 26  GLUCOSE 217* 167*  BUN 21 22  CREATININE 1.31* 1.33*  CALCIUM 9.0 8.9  MG  --  1.8  PHOS  --  4.5   GFR: Estimated Creatinine Clearance: 56.8 mL/min (A) (by C-G formula based on SCr of 1.33 mg/dL (H)). Liver Function Tests: Recent Labs  Lab 06/02/20 0910 06/03/20 0434  AST 61* 57*  ALT 52* 63*  ALKPHOS 67 64  BILITOT 1.8* 1.4*  PROT 7.6 6.6  ALBUMIN 3.6 3.0*   No results for input(s): LIPASE, AMYLASE in the last 168 hours. No results for input(s): AMMONIA in the last 168 hours. Coagulation Profile: Recent Labs  Lab 06/03/20 0112 06/03/20 0434 06/03/20 1154 06/04/20 1247  INR 2.5* 2.4* 2.2* 1.9*   Cardiac Enzymes: No results for input(s): CKTOTAL, CKMB, CKMBINDEX,  TROPONINI in the last 168 hours. BNP (last 3 results) No results for input(s): PROBNP in the last 8760 hours. HbA1C: Recent Labs    06/03/20 0434  HGBA1C 6.8*   CBG: Recent Labs  Lab 06/03/20 2049 06/04/20 0805 06/04/20 1141 06/04/20 1205 06/04/20 1623  GLUCAP 345* 220* 220* 240* 228*   Lipid Profile: No results for input(s): CHOL, HDL, LDLCALC, TRIG, CHOLHDL, LDLDIRECT in the last 72 hours. Thyroid Function Tests: No results for input(s): TSH, T4TOTAL, FREET4, T3FREE, THYROIDAB in the last 72 hours. Anemia Panel: No results for input(s): VITAMINB12, FOLATE, FERRITIN, TIBC, IRON, RETICCTPCT in the last 72 hours. Sepsis Labs: Recent Labs  Lab 06/02/20 0910  LATICACIDVEN 1.9    Recent Results (from the past 240 hour(s))  MRSA PCR Screening     Status: None   Collection Time: 06/02/20  6:00 AM   Specimen: Nasopharyngeal  Result Value Ref Range Status   MRSA by PCR NEGATIVE NEGATIVE Final    Comment:        The GeneXpert MRSA Assay (FDA approved for NASAL specimens only), is one component of a comprehensive MRSA colonization surveillance program. It is not intended to diagnose MRSA infection nor to guide or monitor treatment for MRSA infections. Performed at [facility], [address], [place], [place] [address]   Urine Culture     Status: None   Collection Time: 06/02/20  8:59 AM   Specimen: Urine, Random  Result Value Ref Range Status   Specimen Description   Final    URINE, RANDOM Performed at [facility], [address], [place], [place] [address]    Special Requests   Final    NONE Performed at [facility], [address], [place], [place] [address]    Culture   Final    NO GROWTH Performed at [facility], [place] [address], [place],  [address]    Report Status 06/04/2020 FINAL  Final  Blood culture (routine x 2)     Status: None (Preliminary result)   Collection Time: 06/02/20 11:00  AM   Specimen:  BLOOD  Result Value Ref Range Status   Specimen Description BLOOD BLOOD RIGHT HAND  Final   Special Requests   Final    BOTTLES DRAWN AEROBIC AND ANAEROBIC Blood Culture results may not be optimal due to an inadequate volume of blood received in culture bottles   Culture   Final    NO GROWTH 2 DAYS Performed at [facility], [address] [place], [place] [address]    Report Status PENDING  Incomplete  Blood culture (routine x 2)     Status: Abnormal (Preliminary result)   Collection Time: 06/02/20 11:49 AM   Specimen: BLOOD  Result Value Ref Range Status   Specimen Description   Final    BLOOD LEFT ANTECUBITAL Performed at [facility], [address], [place], [place] [address]    Special Requests   Final    BOTTLES DRAWN AEROBIC AND ANAEROBIC Blood Culture adequate volume Performed at [facility], [address] [place], [place] [address]    Culture  Setup Time   Final    GRAM POSITIVE COCCI IN BOTH AEROBIC AND ANAEROBIC BOTTLES CRITICAL RESULT CALLED TO, READ BACK BY AND VERIFIED WITH: ROB HENGSTAMEN AT [place] 06/03/20 SDR    Culture (A)  Final    STAPHYLOCOCCUS EPIDERMIDIS STAPHYLOCOCCUS HOMINIS THE SIGNIFICANCE OF ISOLATING THIS ORGANISM FROM A SINGLE SET OF BLOOD CULTURES WHEN MULTIPLE SETS ARE DRAWN IS UNCERTAIN. PLEASE NOTIFY THE MICROBIOLOGY DEPARTMENT WITHIN ONE WEEK IF SPECIATION AND SENSITIVITIES ARE REQUIRED. Performed at [facility], [place] [address] [place], [place] [address]    Report Status PENDING  Incomplete  Blood Culture ID Panel (Reflexed)     Status: Abnormal   Collection Time: 06/02/20 11:49 AM  Result Value Ref Range Status   Enterococcus faecalis NOT DETECTED NOT DETECTED Final   Enterococcus Faecium NOT DETECTED NOT DETECTED Final   Listeria monocytogenes NOT DETECTED NOT DETECTED Final   Staphylococcus species DETECTED (A) NOT DETECTED Final    Comment: CRITICAL RESULT CALLED TO, READ BACK BY AND VERIFIED  WITH:  ROB HENGSTAMAN AT 0435 06/03/20 SDR    Staphylococcus aureus (BCID) NOT DETECTED NOT DETECTED Final   Staphylococcus epidermidis NOT DETECTED NOT DETECTED Final   Staphylococcus lugdunensis NOT DETECTED NOT DETECTED Final   Streptococcus species NOT DETECTED NOT DETECTED Final   Streptococcus agalactiae NOT DETECTED NOT DETECTED Final   Streptococcus pneumoniae NOT DETECTED NOT DETECTED Final   Streptococcus pyogenes NOT DETECTED NOT DETECTED Final   A.calcoaceticus-baumannii NOT DETECTED NOT DETECTED Final   Bacteroides fragilis NOT DETECTED NOT DETECTED Final   Enterobacterales NOT DETECTED NOT DETECTED Final   Enterobacter cloacae complex NOT DETECTED NOT DETECTED Final   Escherichia coli NOT DETECTED NOT DETECTED Final   Klebsiella aerogenes NOT DETECTED NOT DETECTED Final   Klebsiella oxytoca NOT DETECTED NOT DETECTED Final   Klebsiella pneumoniae NOT DETECTED NOT DETECTED Final   Proteus species NOT DETECTED NOT DETECTED Final   Salmonella species NOT DETECTED NOT DETECTED Final   Serratia marcescens NOT DETECTED NOT DETECTED Final   Haemophilus influenzae NOT DETECTED NOT DETECTED Final   Neisseria meningitidis NOT DETECTED NOT DETECTED Final   Pseudomonas aeruginosa NOT DETECTED NOT DETECTED Final   Stenotrophomonas maltophilia NOT DETECTED NOT DETECTED Final   Candida albicans NOT DETECTED NOT DETECTED Final   Candida auris NOT DETECTED NOT DETECTED Final   Candida glabrata NOT DETECTED NOT DETECTED Final   Candida krusei NOT DETECTED NOT DETECTED  Final   Candida parapsilosis NOT DETECTED NOT DETECTED Final   Candida tropicalis NOT DETECTED NOT DETECTED Final   Cryptococcus neoformans/gattii NOT DETECTED NOT DETECTED Final    Comment: Performed at [facility], [address] [place], [place] [address]  Resp Panel by RT-PCR (Flu A&B, Covid) Nasopharyngeal Swab     Status: None   Collection Time: 06/02/20 12:12 PM   Specimen: Nasopharyngeal Swab;  Nasopharyngeal(NP) swabs in vial transport medium  Result Value Ref Range Status   SARS Coronavirus 2 by RT PCR NEGATIVE NEGATIVE Final    Comment: (NOTE) SARS-CoV-2 target nucleic acids are NOT DETECTED.  The SARS-CoV-2 RNA is generally detectable in upper respiratory specimens during the acute phase of infection. The lowest concentration of SARS-CoV-2 viral copies this assay can detect is 138 copies/mL. A negative result does not preclude SARS-Cov-2 infection and should not be used as the sole basis for treatment or other patient management decisions. A negative result may occur with  improper specimen collection/handling, submission of specimen other than nasopharyngeal swab, presence of viral mutation(s) within the areas targeted by this assay, and inadequate number of viral copies(<138 copies/mL). A negative result must be combined with clinical observations, patient history, and epidemiological information. The expected result is Negative.  Fact Sheet for Patients:  EntrepreneurPulse.com.au  Fact Sheet for Healthcare Providers:  IncredibleEmployment.be  This test is no t yet approved or cleared by the [place] FDA and  has been authorized for detection and/or diagnosis of SARS-CoV-2 by FDA under an Emergency Use Authorization (EUA). This EUA will remain  in effect (meaning this test can be used) for the duration of the COVID-19 declaration under Section 564(b)(1) of the Act, 21 U.S.C.section 360bbb-3(b)(1), unless the authorization is terminated  or revoked sooner.       Influenza A by PCR NEGATIVE NEGATIVE Final   Influenza B by PCR NEGATIVE NEGATIVE Final    Comment: (NOTE) The Xpert Xpress SARS-CoV-2/FLU/RSV plus assay is intended as an aid in the diagnosis of influenza from Nasopharyngeal swab specimens and should not be used as a sole basis for treatment. Nasal washings and aspirates are unacceptable for Xpert Xpress  SARS-CoV-2/FLU/RSV testing.  Fact Sheet for Patients: EntrepreneurPulse.com.au  Fact Sheet for Healthcare Providers: IncredibleEmployment.be  This test is not yet approved or cleared by the [place] FDA and has been authorized for detection and/or diagnosis of SARS-CoV-2 by FDA under an Emergency Use Authorization (EUA). This EUA will remain in effect (meaning this test can be used) for the duration of the COVID-19 declaration under Section 564(b)(1) of the Act, 21 U.S.C. section 360bbb-3(b)(1), unless the authorization is terminated or revoked.  Performed at [facility], [address] [place], [place] [address]   Aerobic/Anaerobic Culture w Gram Stain (surgical/deep wound)     Status: None (Preliminary result)   Collection Time: 06/03/20  2:10 PM   Specimen: PATH Other; Abscess  Result Value Ref Range Status   Specimen Description   Final    WOUND Performed at [facility], [address], [place], [place] [address]    Special Requests   Final    SCROTAL ABSCESS Performed at [facility], [address] [place], [place] [address]    Gram Stain   Final    ABUNDANT WBC PRESENT,BOTH PMN AND MONONUCLEAR ABUNDANT GRAM NEGATIVE RODS Performed at [facility], [place] [address] [place], [place] [address]    Culture PENDING  Incomplete   Report Status PENDING  Incomplete      Imaging Studies   No results found.   Medications   Scheduled Meds: . Chlorhexidine Gluconate Cloth  6 each Topical Daily  . insulin aspart  0-5 Units Subcutaneous QHS  . insulin aspart  0-9 Units Subcutaneous TID WC  . insulin aspart  4 Units Subcutaneous TID WC  . insulin glargine  10 Units Subcutaneous QHS  . senna  1 tablet Oral QHS  . Warfarin - Pharmacist Dosing Inpatient   Does not apply q1600   Continuous Infusions: . ceFEPime (MAXIPIME) IV 2 g (06/04/20 0846)       LOS: 2 days    Time spent: 30  minutes    Ezekiel Slocumb, DO [facility]  06/04/2020, 5:20 PM      If 7PM-7AM, please contact night-coverage. How to contact the [facility] Attending or Consulting provider [place] or covering provider during after hours [place], for this patient?    1. Check the care team in [facility] and look for a) attending/consulting [facility] provider listed and b) the [facility] team listed 2. Log into [email or web address] and use [place]'s universal password to access. If you do not have the password, please contact the hospital operator. 3. Locate the [facility] provider you are looking for under [facility] and page to a number that you can be directly reached. 4. If you still have difficulty reaching the provider, please page the [facility] (Director on Call) for the Hospitalists listed on amion for assistance.

## 2020-06-04 NOTE — Progress Notes (Signed)
Inpatient Diabetes Program Recommendations  AACE/ADA: New Consensus Statement on Inpatient Glycemic Control (2015)  Target Ranges:  Prepandial:   less than 140 mg/dL      Peak postprandial:   less than 180 mg/dL (1-2 hours)      Critically ill patients:  140 - 180 mg/dL   Lab Results  Component Value Date   GLUCAP 220 (H) 06/04/2020   HGBA1C 6.8 (H) 06/03/2020    Review of Glycemic Control  Admit with: Sepsis secondary to scrotal infection,cellulitis, with concern for infected penile prosthesis  History: DM  Home DM Meds: Humalog 15 units BID (Lunch and Dinner)                             Toujeo 30 units Daily  Current Orders: Novolog Sensitive Correction Scale/ SSI (0-9 units) TID AC + HS   MD- May consider starting: -Lantus 10 units QHS (1/3 total home dose to start) -Novolog 4 units tid meal coverage if eats 50% Secure chat sent to Dr. Arbutus Ped.  Thank you, Nani Gasser. , RN, MSN, CDE  Diabetes Coordinator Inpatient Glycemic Control Team Team Pager [phone] (8am-5pm) 06/04/2020 9:35 AM

## 2020-06-05 ENCOUNTER — Telehealth: Payer: Self-pay | Admitting: Physician Assistant

## 2020-06-05 DIAGNOSIS — N492 Inflammatory disorders of scrotum: Secondary | ICD-10-CM | POA: Diagnosis not present

## 2020-06-05 LAB — CBC
HCT: 32.7 % — ABNORMAL LOW (ref 39.0–52.0)
Hemoglobin: 10.8 g/dL — ABNORMAL LOW (ref 13.0–17.0)
MCH: 31.9 pg (ref 26.0–34.0)
MCHC: 33 g/dL (ref 30.0–36.0)
MCV: 96.5 fL (ref 80.0–100.0)
Platelets: 225 10*3/uL (ref 150–400)
RBC: 3.39 MIL/uL — ABNORMAL LOW (ref 4.22–5.81)
RDW: 12.8 % (ref 11.5–15.5)
WBC: 9 10*3/uL (ref 4.0–10.5)
nRBC: 0 % (ref 0.0–0.2)

## 2020-06-05 LAB — COMPREHENSIVE METABOLIC PANEL
ALT: 119 U/L — ABNORMAL HIGH (ref 0–44)
AST: 84 U/L — ABNORMAL HIGH (ref 15–41)
Albumin: 2.6 g/dL — ABNORMAL LOW (ref 3.5–5.0)
Alkaline Phosphatase: 55 U/L (ref 38–126)
Anion gap: 6 (ref 5–15)
BUN: 21 mg/dL (ref 8–23)
CO2: 24 mmol/L (ref 22–32)
Calcium: 8.5 mg/dL — ABNORMAL LOW (ref 8.9–10.3)
Chloride: 110 mmol/L (ref 98–111)
Creatinine, Ser: 1 mg/dL (ref 0.61–1.24)
GFR, Estimated: >60 mL/min (ref >60)
Glucose, Bld: 167 mg/dL — ABNORMAL HIGH (ref 70–99)
Potassium: 4 mmol/L (ref 3.5–5.1)
Sodium: 140 mmol/L (ref 135–145)
Total Bilirubin: 0.7 mg/dL (ref 0.3–1.2)
Total Protein: 5.8 g/dL — ABNORMAL LOW (ref 6.5–8.1)

## 2020-06-05 LAB — GLUCOSE, CAPILLARY
Glucose-Capillary: 154 mg/dL — ABNORMAL HIGH (ref 70–99)
Glucose-Capillary: 167 mg/dL — ABNORMAL HIGH (ref 70–99)
Glucose-Capillary: 198 mg/dL — ABNORMAL HIGH (ref 70–99)
Glucose-Capillary: 159 mg/dL — ABNORMAL HIGH (ref 70–99)

## 2020-06-05 LAB — PROTIME-INR
INR: 2 — ABNORMAL HIGH (ref 0.8–1.2)
Prothrombin Time: 22.7 seconds — ABNORMAL HIGH (ref 11.4–15.2)

## 2020-06-05 LAB — CULTURE, BLOOD (ROUTINE X 2): Special Requests: ADEQUATE

## 2020-06-05 MED ORDER — SODIUM CHLORIDE 0.9 % IV SOLN
2.0000 g | Freq: Three times a day (TID) | INTRAVENOUS | Status: DC
  Administered 2020-06-05 – 2020-06-06 (×4): 2 g via INTRAVENOUS
  Filled 2020-06-05 (×7): qty 2

## 2020-06-05 MED ORDER — POLYETHYLENE GLYCOL 3350 17 G PO PACK
17.0000 g | PACK | Freq: Every day | ORAL | Status: DC
  Administered 2020-06-05 – 2020-06-06 (×2): 17 g via ORAL
  Filled 2020-06-05 (×2): qty 1

## 2020-06-05 MED ORDER — BISACODYL 5 MG PO TBEC
5.0000 mg | DELAYED_RELEASE_TABLET | Freq: Every day | ORAL | Status: DC | PRN
  Administered 2020-06-05 – 2020-06-06 (×2): 5 mg via ORAL
  Filled 2020-06-05 (×2): qty 1

## 2020-06-05 MED ORDER — MAGNESIUM CITRATE PO SOLN
1.0000 | Freq: Once | ORAL | Status: DC | PRN
  Filled 2020-06-05 (×2): qty 296

## 2020-06-05 MED ORDER — WARFARIN SODIUM 2.5 MG PO TABS
2.5000 mg | ORAL_TABLET | Freq: Once | ORAL | Status: AC
  Administered 2020-06-05: 15:00:00 2.5 mg via ORAL
  Filled 2020-06-05: qty 1

## 2020-06-05 NOTE — Progress Notes (Signed)
[place] for Warfarin Indication: atrial fibrillation  Patient Measurements: Height: 5\' 8"  (172.7 cm) Weight: 97.5 kg (214 lb 15.2 oz) IBW/kg (Calculated) : 68.4  Vital Signs: Temp: 98.3 F (36.8 C) (04/21 0352) Temp Source: Oral (04/21 0352) BP: 123/59 (04/21 0352) Pulse Rate: 51 (04/21 0352)  Labs: Recent Labs    06/02/20 0910 06/03/20 0112 06/03/20 0434 06/03/20 1154 06/04/20 1247 06/05/20 0348  HGB 13.5  --  12.3*  --   --  10.8*  HCT 40.0  --  36.6*  --   --  32.7*  PLT 207  --  193  --   --  225  LABPROT  --    < > 25.9* 24.6* 21.9* 22.7*  INR  --    < > 2.4* 2.2* 1.9* 2.0*  CREATININE 1.31*  --  1.33*  --   --  1.00   < > = values in this interval not displayed.    Estimated Creatinine Clearance: 75.6 mL/min (by C-G formula based on SCr of 1 mg/dL).   Medical History: Past Medical History:  Diagnosis Date  . Arthritis   . Diabetes mellitus without complication ([place])    type 2  . GERD (gastroesophageal reflux disease)   . Hx of blood clots Nov 2016   lung  . Hypertension   . Shortness of breath dyspnea    on exertion  . Sleep apnea    cpap    Medications:  Scheduled:  . Chlorhexidine Gluconate Cloth  6 each Topical Daily  . insulin aspart  0-5 Units Subcutaneous QHS  . insulin aspart  0-9 Units Subcutaneous TID WC  . insulin aspart  4 Units Subcutaneous TID WC  . insulin glargine  10 Units Subcutaneous QHS  . senna  1 tablet Oral QHS  . Warfarin - Pharmacist Dosing Inpatient   Does not apply q1600   Infusions:  . ceFEPime (MAXIPIME) IV 2 g (06/04/20 2151)    Assessment: 75 yo M to resume Warfarin for Afib s/p removal of penile prosthesis and I&D of scrotal abscesses 4/19 evening. Home Warfarin dose: 2.5 mg all days except Wed, 5 mg on Wednesdays  Date   INR   Warfarin dose 4/18      none (5mg  po vit k) 4/19   2.2   none (2.5mg  po vit k) 4/20   1.9   5 mg 4/21   2.0   2.5 mg  DDIs:  cefepime  Goal of Therapy:  INR 2-3 Monitor platelets by anticoagulation protocol: Yes   Plan:   Warfarin 2.5 mg po x 1  CBC per protocol, INR daily  Dallie Piles 06/05/2020,7:12 AM

## 2020-06-05 NOTE — Consult Note (Signed)
Pharmacy Antibiotic Note  Dustin Estrada is a 75 y.o. male with medical history including diabetes, HTN, history of penile implant admitted on 06/02/2020 with groin swelling / penile discharge. Onset 3-4 days prior to presentation. Per EDP note, no trauma to area. Ultrasound with "fluid collection around penile implant hardware". CT scan with "2 complex fluid collections in the scrotum which are separate from the testes and surrounding the reservoir and tubing for the patient's penile prosthesis. Both of these collections demonstrate internal air bubbles and rim enhancement suspected for infected fluid". Pharmacy has been consulted for cefepime dosing. Since admission his renal function has improved.  Today, 06/05/2020 Day #4 antibiotics.  WBC 13.1 to 9.0  Tm 98.5 F  Renal: SCr 1.33 >> 1.00  Plan:  Adjust cefepime to 2 grams IV every 8 hours   Monitor renal function   Follow-up Blood cultures results  Await OR culture findings  Height: 5\' 8"  (172.7 cm) Weight: 97.5 kg (214 lb 15.2 oz) IBW/kg (Calculated) : 68.4  Temp (24hrs), Avg:98.1 F (36.7 C), Min:97.8 F (36.6 C), Max:98.5 F (36.9 C)  Recent Labs  Lab 06/02/20 0910 06/03/20 0434 06/05/20 0348  WBC 13.1* 10.8* 9.0  CREATININE 1.31* 1.33* 1.00  LATICACIDVEN 1.9  --   --     Estimated Creatinine Clearance: 75.6 mL/min (by C-G formula based on SCr of 1 mg/dL).    No Known Allergies  Antimicrobials this admission: Vancomycin 4/18 x 1 Zosyn 4/18 >> 4/19 Cefepime 4/18  >>  Microbiology results: 4/20 BCx: NG < 12 hours 4/19 Scrotal wound: Ab GNR (pending) 4/18 BCx: 1/2 sets (both bottles) GPC, BCID = Staph epidermidis, Staph hominis 4/18 UCx: NG 4/18 MRSA PCR: Negative  Thank you for allowing pharmacy to be a part of this patient's care.  Vallery Sa, PharmD 06/05/2020 8:56 AM

## 2020-06-05 NOTE — Care Management Important Message (Signed)
Important Message  Patient Details  Name: Dustin Estrada MRN: [id number] Date of Birth: September 03, 1945   Medicare Important Message Given:  Yes     Dannette Barbara 06/05/2020, 1:47 PM

## 2020-06-05 NOTE — Evaluation (Signed)
Physical Therapy Evaluation Patient Details Name: Dustin Estrada MRN: [id number] DOB: 05/01/1945 Today's Date: 06/05/2020   History of Present Illness  Pt is a 75 y.o. male with medical history significant for penile implant, type 2 diabetes, essential hypertension, chronic A. fib on coumadin who presented to [facility] ED due to worsening scrotal swelling and discharge.  MD assessment includes: Sepsis secondary to infected penile prosthesis with secondary scrotal abscess with pt now s/p removal of multicomponent penile prosthesis and scotal abscess I&D, possible UTI, AKI, and transaminitis.    Clinical Impression  Pt was pleasant and motivated to participate during the session.  Pt moved cautiously to avoid discomfort in the surgical area but was steady with good control and stability throughout.  Pt reported no adverse symptoms other than surgical site soreness with mobility with SpO2 and HR WNL throughout.  Will keep pt on PT caseload while in acute care for increased mobilization and decreased risk of functional decline but pt will not require skilled PT services upon discharge.      Follow Up Recommendations No PT follow up    Equipment Recommendations  None recommended by PT    Recommendations for Other Services       Precautions / Restrictions Precautions Precautions: None Restrictions Weight Bearing Restrictions: No      Mobility  Bed Mobility Overal bed mobility: Modified Independent             General bed mobility comments: Extra time only    Transfers Overall transfer level: Independent Equipment used: None             General transfer comment: Good control and stability  Ambulation/Gait Ambulation/Gait assistance: Supervision Gait Distance (Feet): 15 Feet Assistive device: None Gait Pattern/deviations: Step-through pattern Gait velocity: decreased   General Gait Details: Slow cadence but steady without LOB  Stairs            Wheelchair  Mobility    Modified Rankin (Stroke Patients Only)       Balance Overall balance assessment: No apparent balance deficits (not formally assessed)                                           Pertinent Vitals/Pain Pain Assessment: No/denies pain    Home Living Family/patient expects to be discharged to:: Private residence Living Arrangements: Spouse/significant other Available Help at Discharge: Family;Available 24 hours/day Type of Home: House Home Access: Stairs to enter Entrance Stairs-Rails: Right;Left;Can reach both Entrance Stairs-Number of Steps: 2 Home Layout: One level Home Equipment: Walker - 2 wheels;Cane - single point Additional Comments: Pt self employed auctioneer and stated he has access to any DME he would need    Prior Function Level of Independence: Independent         Comments: Ind amb community distances without an AD, no fall history, Ind with ADLs     Hand Dominance        Extremity/Trunk Assessment   Upper Extremity Assessment Upper Extremity Assessment: Overall WFL for tasks assessed    Lower Extremity Assessment Lower Extremity Assessment: Overall WFL for tasks assessed       Communication   Communication: No difficulties  Cognition Arousal/Alertness: Awake/alert Behavior During Therapy: WFL for tasks assessed/performed Overall Cognitive Status: Within Functional Limits for tasks assessed  General Comments      Exercises Total Joint Exercises Ankle Circles/Pumps: AROM;Strengthening;Both;10 reps Quad Sets: Strengthening;Both;10 reps Gluteal Sets: 10 reps;Both;Strengthening Long Arc Quad: AROM;Strengthening;Both;10 reps Knee Flexion: 10 reps;Both;Strengthening;AROM Other Exercises Other Exercises: HEP education for BLE APs, QS, GS, and LAQs x 10 each 5-6x/day while in acute care   Assessment/Plan    PT Assessment Patient needs continued PT services  PT  Problem List Decreased activity tolerance       PT Treatment Interventions DME instruction;Gait training;Stair training;Functional mobility training;Therapeutic activities;Therapeutic exercise;Balance training;Patient/family education    PT Goals (Current goals can be found in the Care Plan section)  Acute Rehab PT Goals Patient Stated Goal: To return home PT Goal Formulation: With patient Time For Goal Achievement: 06/18/20 Potential to Achieve Goals: Good    Frequency Min 2X/week   Barriers to discharge        Co-evaluation               AM-PAC PT "6 Clicks" Mobility  Outcome Measure Help needed turning from your back to your side while in a flat bed without using bedrails?: None Help needed moving from lying on your back to sitting on the side of a flat bed without using bedrails?: None Help needed moving to and from a bed to a chair (including a wheelchair)?: A Little Help needed standing up from a chair using your arms (e.g., wheelchair or bedside chair)?: A Little Help needed to walk in hospital room?: A Little Help needed climbing 3-5 steps with a railing? : A Little 6 Click Score: 20    End of Session Equipment Utilized During Treatment: Gait belt Activity Tolerance: Patient tolerated treatment well Patient left: in bed;with call bell/phone within reach;with bed alarm set;Other (comment) (pt declined up in chair) Nurse Communication: Mobility status PT Visit Diagnosis: Difficulty in walking, not elsewhere classified (R26.2)    Time: 2355-7322 PT Time Calculation (min) (ACUTE ONLY): 37 min   Charges:   PT Evaluation $PT Eval Low Complexity: 1 Low PT Treatments $Therapeutic Exercise: 8-22 mins        D. Royetta Asal PT, DPT 06/05/20, 11:47 AM

## 2020-06-05 NOTE — Telephone Encounter (Signed)
please schedule him for outpatient foley removal and wound check (no voiding trial) with me in 1 week   App made

## 2020-06-05 NOTE — Progress Notes (Signed)
PROGRESS NOTE    Dustin Estrada   IZX:[id number]  DOB: [birth year]  PCP: Gracelyn Nurse, MD    DOA: 06/02/2020 LOS: 3   Brief Narrative   Dustin Estrada is a 75 y.o. male with medical history significant for penile implant, type 2 diabetes, essential hypertension, chronic A. fib on Coumadin who presented to [facility] ED due to worsening scrotal swelling and discharge.  Onset on Thursday afternoon, 5 days prior to presentation.  Associated with erythema and pain with ambulation.  On Friday and Saturday his underwears were soaked with purulent discharge.  Had a fever of 102 on Saturday and on Sunday a fever of 101, took Tylenol.  Due to worsening symptoms his wife made him come to the ED.  CT abdomen and pelvis with contrast in the ED revealed 2 complex fluid collection in the scrotum which are separate from the testes and surrounding the reservoir and tubing for the patient's penile prosthesis.  Started on IV antibiotics empirically in the ED.  Urology was consulted by EDP. Taken to the OR on 4/19 with urology for cystoscopy, removal of penile prosthesis and I&D of scrotal abscesses.  On antibiotics: vancomycin and cefepime.   2 bottles of preliminary blood culture growing staph epi and staph hominis, possibly contamination.  Cultures from scrotal abscess with abundant gram-negative rods preliminarily.   Assessment & Plan   Active Problems:   Scrotal infection   Sepsis secondary to infected penile prosthesis with secondary scrotal abscess met for sepsis on admission with fever, leukocytosis, AKI consistent with organ dysfunction in the presence of new infection.   Initially started on Vanco and Zosyn.  Zosyn was transitioned to cefepime. -- DC Vanco -- Staph species on initial blood cultures suspicious for contamination -- Obtain repeat blood cultures -- Follow abscess culture  -- Continue empiric Cefepime -- Continue supportive care: Pain control as needed, antiemetics -- Continue Foley  x1 week per urology, follow up outpatient -- BID wet-to-dry dressing changes; RN to train pt and wife to do at home  ?  UTI -UA had pyuria which could be from the scrotal infection.  Patient denies dysuria.  Urine culture with no growth.  On antibiotics as above.  Chronic A. Fib -anticoagulated on warfarin which was held for surgery yesterday.  Discussed with urology and given okay to resume warfarin. -- Monitor INRs daily --Continue warfarin, dosing by pharmacy  Acute kidney injury -present on admission likely due to sepsis and dehydration. Improving with IV hydration.  Baseline creatinine around 1. -- Stop IV fluids --Monitor renal function daily   Obesity: Body mass index is 32.68 kg/m.  Complicates overall care and prognosis.  Recommend lifestyle modifications including physical activity and diet for weight loss and overall long-term health.  Transaminitis - Suspect secondary to sepsis.  May be worsened due to Cefepime.  CT abdomen pelvis negative for any acute liver abnormalities.   -- Hold Lipitor for now -- Monitor LFTs  Essential hypertension -BP controlled. -- Home losartan on hold due to AKI, may resume tomorrow but watch renal function off of fluids first -- Monitor BP   DVT prophylaxis: SCDs Start: 06/02/20 1254 warfarin (COUMADIN) tablet 2.5 mg   Diet:  Diet Orders (From admission, onward)    Start     Ordered   06/04/20 0846  Diet heart healthy/carb modified Room service appropriate? Yes; Fluid consistency: Thin  Diet effective now       Question Answer Comment  Diet-HS Snack? Nothing  Room service appropriate? Yes   Fluid consistency: Thin      06/04/20 0846            Code Status: Full Code    Subjective 06/05/20    Patient seen awake sitting up in bed this morning, wife and sister-in-law at bedside.  Pt reports feeling well.  No pain at rest laying in bed, but did have great deal of pain when attempting to sit edge of bed or in recliner when he  worked with therapy.  No other acute complaints.  We discussed twice daily dressing changes. Pt and wife feel they should be able to do this at home, but need to be sure of this prior to going.   Disposition Plan & Communication   Status is: Inpatient  Remains inpatient appropriate because:  Need to complex twice daily dressing changes , patient and family training to do this is underway.  Anticipate dc tomorrow if dressing changes are feasible at home.   Dispo:  Patient From: Home  Planned Disposition: Home  Medically stable for discharge: No     Family Communication: Wife at bedside on rounds today 4/20   Consults, Procedures, Significant Events   Consultants:   Urology  Procedures:   4/19: Removal of penile prosthesis, I&D of scrotal abscesses  Antimicrobials:  Anti-infectives (From admission, onward)   Start     Dose/Rate Route Frequency Ordered Stop   06/05/20 1400  ceFEPIme (MAXIPIME) 2 g in sodium chloride 0.9 % 100 mL IVPB        2 g 200 mL/hr over 30 Minutes Intravenous Every 8 hours 06/05/20 0902     06/03/20 1400  ceFEPIme (MAXIPIME) 2 g in sodium chloride 0.9 % 100 mL IVPB  Status:  Discontinued        2 g 200 mL/hr over 30 Minutes Intravenous Every 12 hours 06/03/20 0837 06/05/20 0902   06/03/20 1400  vancomycin (VANCOREADY) IVPB 1500 mg/300 mL  Status:  Discontinued        1,500 mg 150 mL/hr over 120 Minutes Intravenous Every 24 hours 06/03/20 0837 06/04/20 1352   06/03/20 1203  piperacillin-tazobactam (ZOSYN) 3.375 GM/50ML IVPB  Status:  Discontinued       Note to Pharmacy: [place], [place]   : cabinet override      06/03/20 1203 06/03/20 1335   06/02/20 1400  vancomycin (VANCOCIN) IVPB 1000 mg/200 mL premix  Status:  Discontinued        1,000 mg 200 mL/hr over 60 Minutes Intravenous Every 24 hours 06/02/20 1329 06/03/20 0837   06/02/20 1400  piperacillin-tazobactam (ZOSYN) IVPB 3.375 g  Status:  Discontinued        3.375 g 12.5 mL/hr over 240 Minutes  Intravenous Every 8 hours 06/02/20 1333 06/03/20 0833   06/02/20 1245  vancomycin (VANCOCIN) IVPB 1000 mg/200 mL premix        1,000 mg 200 mL/hr over 60 Minutes Intravenous  Once 06/02/20 1233 06/02/20 1402   06/02/20 1100  ceFEPIme (MAXIPIME) 2 g in sodium chloride 0.9 % 100 mL IVPB        2 g 200 mL/hr over 30 Minutes Intravenous  Once 06/02/20 1048 06/02/20 1300        Micro    Objective   Vitals:   06/05/20 0008 06/05/20 0352 06/05/20 0810 06/05/20 1229  BP: 119/64 (!) 123/59 128/60 140/65  Pulse: (!) 53 (!) 51 (!) 49 (!) 52  Resp: $Remo'18 16 16 16  'tolxQ$ Temp: 97.8 F (36.6  C) 98.3 F (36.8 C) 97.9 F (36.6 C) 98.2 F (36.8 C)  TempSrc: Oral Oral Oral Oral  SpO2: 98% 99% 97% 97%  Weight:      Height:        Intake/Output Summary (Last 24 hours) at 06/05/2020 1452 Last data filed at 06/05/2020 1244 Gross per 24 hour  Intake 240 ml  Output 1900 ml  Net -1660 ml   Filed Weights   06/02/20 0857 06/03/20 1205  Weight: 97.5 kg 97.5 kg    Physical Exam:  General exam: awake, alert, no acute distress, obese Respiratory system: CTAB, no wheezes or rhonchi, normal respiratory effort. Cardiovascular system: normal S1/S2, RRR, no pedal edema.   Central nervous system: A&O x3. CN's grossly intact, normal speech, no gross focal deficits Genitourinary: Foley in place Psychiatry: normal mood, congruent affect, judgement and insight appear normal  Labs   Data Reviewed: I have personally reviewed following labs and imaging studies  CBC: Recent Labs  Lab 06/02/20 0910 06/03/20 0434 06/05/20 0348  WBC 13.1* 10.8* 9.0  NEUTROABS 11.2* 8.3*  --   HGB 13.5 12.3* 10.8*  HCT 40.0 36.6* 32.7*  MCV 94.6 94.8 96.5  PLT 207 193 940   Basic Metabolic Panel: Recent Labs  Lab 06/02/20 0910 06/03/20 0434 06/05/20 0348  NA 134* 138 140  K 3.8 4.4 4.0  CL 99 104 110  CO2 $Re'22 26 24  'Jyz$ GLUCOSE 217* 167* 167*  BUN $Re'21 22 21  'LxO$ CREATININE 1.31* 1.33* 1.00  CALCIUM 9.0 8.9 8.5*  MG  --   1.8  --   PHOS  --  4.5  --    GFR: Estimated Creatinine Clearance: 75.6 mL/min (by C-G formula based on SCr of 1 mg/dL). Liver Function Tests: Recent Labs  Lab 06/02/20 0910 06/03/20 0434 06/05/20 0348  AST 61* 57* 84*  ALT 52* 63* 119*  ALKPHOS 67 64 55  BILITOT 1.8* 1.4* 0.7  PROT 7.6 6.6 5.8*  ALBUMIN 3.6 3.0* 2.6*   No results for input(s): LIPASE, AMYLASE in the last 168 hours. No results for input(s): AMMONIA in the last 168 hours. Coagulation Profile: Recent Labs  Lab 06/03/20 0112 06/03/20 0434 06/03/20 1154 06/04/20 1247 06/05/20 0348  INR 2.5* 2.4* 2.2* 1.9* 2.0*   Cardiac Enzymes: No results for input(s): CKTOTAL, CKMB, CKMBINDEX, TROPONINI in the last 168 hours. BNP (last 3 results) No results for input(s): PROBNP in the last 8760 hours. HbA1C: Recent Labs    06/03/20 0434  HGBA1C 6.8*   CBG: Recent Labs  Lab 06/04/20 1205 06/04/20 1623 06/04/20 2114 06/05/20 0736 06/05/20 1259  GLUCAP 240* 228* 218* 154* 167*   Lipid Profile: No results for input(s): CHOL, HDL, LDLCALC, TRIG, CHOLHDL, LDLDIRECT in the last 72 hours. Thyroid Function Tests: No results for input(s): TSH, T4TOTAL, FREET4, T3FREE, THYROIDAB in the last 72 hours. Anemia Panel: No results for input(s): VITAMINB12, FOLATE, FERRITIN, TIBC, IRON, RETICCTPCT in the last 72 hours. Sepsis Labs: Recent Labs  Lab 06/02/20 0910  LATICACIDVEN 1.9    Recent Results (from the past 240 hour(s))  MRSA PCR Screening     Status: None   Collection Time: 06/02/20  6:00 AM   Specimen: Nasopharyngeal  Result Value Ref Range Status   MRSA by PCR NEGATIVE NEGATIVE Final    Comment:        The GeneXpert MRSA Assay (FDA approved for NASAL specimens only), is one component of a comprehensive MRSA colonization surveillance program. It is not intended to diagnose  MRSA infection nor to guide or monitor treatment for MRSA infections. Performed at [facility], [address],  [place], [place] [address]   Urine Culture     Status: None   Collection Time: 06/02/20  8:59 AM   Specimen: Urine, Random  Result Value Ref Range Status   Specimen Description   Final    URINE, RANDOM Performed at [facility], [address], [place], [place] [address]    Special Requests   Final    NONE Performed at [facility], [address] [place], [place] [address]    Culture   Final    NO GROWTH Performed at [facility], [place] [address] [place], [place] [address]    Report Status 06/04/2020 FINAL  Final  Blood culture (routine x 2)     Status: None (Preliminary result)   Collection Time: 06/02/20 11:00 AM   Specimen: BLOOD  Result Value Ref Range Status   Specimen Description BLOOD BLOOD RIGHT HAND  Final   Special Requests   Final    BOTTLES DRAWN AEROBIC AND ANAEROBIC Blood Culture results may not be optimal due to an inadequate volume of blood received in culture bottles   Culture   Final    NO GROWTH 3 DAYS Performed at [facility], [address], [place], [place] [address]    Report Status PENDING  Incomplete  Blood culture (routine x 2)     Status: Abnormal   Collection Time: 06/02/20 11:49 AM   Specimen: BLOOD  Result Value Ref Range Status   Specimen Description   Final    BLOOD LEFT ANTECUBITAL Performed at [facility], [address], [place], [place] [address]    Special Requests   Final    BOTTLES DRAWN AEROBIC AND ANAEROBIC Blood Culture adequate volume Performed at [facility], [address] [place], [place] [address]    Culture  Setup Time   Final    GRAM POSITIVE COCCI IN BOTH AEROBIC AND ANAEROBIC BOTTLES CRITICAL RESULT CALLED TO, READ BACK BY AND VERIFIED WITH: ROB HENGSTAMEN AT 1027 06/03/20 SDR    Culture (A)  Final    STAPHYLOCOCCUS EPIDERMIDIS STAPHYLOCOCCUS HOMINIS THE SIGNIFICANCE OF ISOLATING THIS ORGANISM FROM A SINGLE SET OF BLOOD CULTURES WHEN MULTIPLE SETS ARE DRAWN IS  UNCERTAIN. PLEASE NOTIFY THE MICROBIOLOGY DEPARTMENT WITHIN ONE WEEK IF SPECIATION AND SENSITIVITIES ARE REQUIRED. Performed at [facility], [place] [address] [place], [place] [address]    Report Status 06/05/2020 FINAL  Final  Blood Culture ID Panel (Reflexed)     Status: Abnormal   Collection Time: 06/02/20 11:49 AM  Result Value Ref Range Status   Enterococcus faecalis NOT DETECTED NOT DETECTED Final   Enterococcus Faecium NOT DETECTED NOT DETECTED Final   Listeria monocytogenes NOT DETECTED NOT DETECTED Final   Staphylococcus species DETECTED (A) NOT DETECTED Final    Comment: CRITICAL RESULT CALLED TO, READ BACK BY AND VERIFIED WITH:  ROB HENGSTAMAN AT 0435 06/03/20 SDR    Staphylococcus aureus (BCID) NOT DETECTED NOT DETECTED Final   Staphylococcus epidermidis NOT DETECTED NOT DETECTED Final   Staphylococcus lugdunensis NOT DETECTED NOT DETECTED Final   Streptococcus species NOT DETECTED NOT DETECTED Final   Streptococcus agalactiae NOT DETECTED NOT DETECTED Final   Streptococcus pneumoniae NOT DETECTED NOT DETECTED Final   Streptococcus pyogenes NOT DETECTED NOT DETECTED Final   A.calcoaceticus-baumannii NOT DETECTED NOT DETECTED Final   Bacteroides fragilis NOT DETECTED NOT DETECTED  Final   Enterobacterales NOT DETECTED NOT DETECTED Final   Enterobacter cloacae complex NOT DETECTED NOT DETECTED Final   Escherichia coli NOT DETECTED NOT DETECTED Final   Klebsiella aerogenes NOT DETECTED NOT DETECTED Final   Klebsiella oxytoca NOT DETECTED NOT DETECTED Final   Klebsiella pneumoniae NOT DETECTED NOT DETECTED Final   Proteus species NOT DETECTED NOT DETECTED Final   Salmonella species NOT DETECTED NOT DETECTED Final   Serratia marcescens NOT DETECTED NOT DETECTED Final   Haemophilus influenzae NOT DETECTED NOT DETECTED Final   Neisseria meningitidis NOT DETECTED NOT DETECTED Final   Pseudomonas aeruginosa NOT DETECTED NOT DETECTED Final   Stenotrophomonas maltophilia NOT  DETECTED NOT DETECTED Final   Candida albicans NOT DETECTED NOT DETECTED Final   Candida auris NOT DETECTED NOT DETECTED Final   Candida glabrata NOT DETECTED NOT DETECTED Final   Candida krusei NOT DETECTED NOT DETECTED Final   Candida parapsilosis NOT DETECTED NOT DETECTED Final   Candida tropicalis NOT DETECTED NOT DETECTED Final   Cryptococcus neoformans/gattii NOT DETECTED NOT DETECTED Final    Comment: Performed at [facility], [address] [place], [place] [address]  Resp Panel by RT-PCR (Flu A&B, Covid) Nasopharyngeal Swab     Status: None   Collection Time: 06/02/20 12:12 PM   Specimen: Nasopharyngeal Swab; Nasopharyngeal(NP) swabs in vial transport medium  Result Value Ref Range Status   SARS Coronavirus 2 by RT PCR NEGATIVE NEGATIVE Final    Comment: (NOTE) SARS-CoV-2 target nucleic acids are NOT DETECTED.  The SARS-CoV-2 RNA is generally detectable in upper respiratory specimens during the acute phase of infection. The lowest concentration of SARS-CoV-2 viral copies this assay can detect is 138 copies/mL. A negative result does not preclude SARS-Cov-2 infection and should not be used as the sole basis for treatment or other patient management decisions. A negative result may occur with  improper specimen collection/handling, submission of specimen other than nasopharyngeal swab, presence of viral mutation(s) within the areas targeted by this assay, and inadequate number of viral copies(<138 copies/mL). A negative result must be combined with clinical observations, patient history, and epidemiological information. The expected result is Negative.  Fact Sheet for Patients:  EntrepreneurPulse.com.au  Fact Sheet for Healthcare Providers:  IncredibleEmployment.be  This test is no t yet approved or cleared by the [place] FDA and  has been authorized for detection and/or diagnosis of SARS-CoV-2 by FDA under an Emergency  Use Authorization (EUA). This EUA will remain  in effect (meaning this test can be used) for the duration of the COVID-19 declaration under Section 564(b)(1) of the Act, 21 U.S.C.section 360bbb-3(b)(1), unless the authorization is terminated  or revoked sooner.       Influenza A by PCR NEGATIVE NEGATIVE Final   Influenza B by PCR NEGATIVE NEGATIVE Final    Comment: (NOTE) The Xpert Xpress SARS-CoV-2/FLU/RSV plus assay is intended as an aid in the diagnosis of influenza from Nasopharyngeal swab specimens and should not be used as a sole basis for treatment. Nasal washings and aspirates are unacceptable for Xpert Xpress SARS-CoV-2/FLU/RSV testing.  Fact Sheet for Patients: EntrepreneurPulse.com.au  Fact Sheet for Healthcare Providers: IncredibleEmployment.be  This test is not yet approved or cleared by the [place] FDA and has been authorized for detection and/or diagnosis of SARS-CoV-2 by FDA under an Emergency Use Authorization (EUA). This EUA will remain in effect (meaning this test can be used) for the duration of the COVID-19 declaration under Section 564(b)(1) of the Act, 21 U.S.C. section  360bbb-3(b)(1), unless the authorization is terminated or revoked.  Performed at [facility], [address] [place], [place] [address]   Aerobic/Anaerobic Culture w Gram Stain (surgical/deep wound)     Status: None (Preliminary result)   Collection Time: 06/03/20  2:10 PM   Specimen: PATH Other; Abscess  Result Value Ref Range Status   Specimen Description   Final    WOUND Performed at [facility], [address] [place], [place] [address]    Special Requests   Final    SCROTAL ABSCESS Performed at [facility], [address] [place], [place] [address]    Gram Stain   Final    ABUNDANT WBC PRESENT,BOTH PMN AND MONONUCLEAR ABUNDANT GRAM NEGATIVE RODS    Culture   Final    ABUNDANT MORGANELLA  MORGANII SUSCEPTIBILITIES TO FOLLOW Performed at [facility], [place] [address] [place], [place] [address]    Report Status PENDING  Incomplete  Culture, blood (single) w Reflex to ID Panel     Status: None (Preliminary result)   Collection Time: 06/04/20  5:43 PM   Specimen: BLOOD  Result Value Ref Range Status   Specimen Description BLOOD RIGHT ANTECUBITAL  Final   Special Requests   Final    BOTTLES DRAWN AEROBIC AND ANAEROBIC Blood Culture adequate volume   Culture   Final    NO GROWTH < 12 HOURS Performed at [facility], [address] [place], [place] [address]    Report Status PENDING  Incomplete      Imaging Studies   No results found.   Medications   Scheduled Meds: . Chlorhexidine Gluconate Cloth  6 each Topical Daily  . insulin aspart  0-5 Units Subcutaneous QHS  . insulin aspart  0-9 Units Subcutaneous TID WC  . insulin aspart  4 Units Subcutaneous TID WC  . insulin glargine  10 Units Subcutaneous QHS  . senna  1 tablet Oral QHS  . warfarin  2.5 mg Oral ONCE-1600  . Warfarin - Pharmacist Dosing Inpatient   Does not apply q1600   Continuous Infusions: . ceFEPime (MAXIPIME) IV         LOS: 3 days    Time spent: 25 minutes with >50% spent at bedside and in coordination of care.    Ezekiel Slocumb, DO [facility]  06/05/2020, 2:52 PM      If 7PM-7AM, please contact night-coverage. How to contact the [facility] Attending or Consulting provider [place] or covering provider during after hours [place], for this patient?    1. Check the care team in [facility] and look for a) attending/consulting TR[facility] provider listed and b) the [facility] team listed 2. Log into [email or web address] and use [place]'s universal password to access. If you do not have the password, please contact the hospital operator. 3. Locate the [facility] provider you are looking for under [facility] and page to a number that you can be directly reached. 4. If you still have  difficulty reaching the provider, please page the [facility] (Director on Call) for the Hospitalists listed on amion for assistance.

## 2020-06-05 NOTE — Progress Notes (Signed)
Urology Inpatient Progress Note  Subjective: No acute events overnight. Creatinine at baseline, 1.00.  WBC count down today, 9.0.  Wound culture still pending with abundant GNR's and WBCs.  On antibiotics as below. Foley catheter in place draining clear, yellow urine. Patient reports feeling much better today with no acute concerns.  Nursing exchanged his dressing last night and he is tolerating dressing changes well.  Anti-infectives: Anti-infectives (From admission, onward)   Start     Dose/Rate Route Frequency Ordered Stop   06/05/20 1400  ceFEPIme (MAXIPIME) 2 g in sodium chloride 0.9 % 100 mL IVPB        2 g 200 mL/hr over 30 Minutes Intravenous Every 8 hours 06/05/20 0902     06/03/20 1400  ceFEPIme (MAXIPIME) 2 g in sodium chloride 0.9 % 100 mL IVPB  Status:  Discontinued        2 g 200 mL/hr over 30 Minutes Intravenous Every 12 hours 06/03/20 0837 06/05/20 0902   06/03/20 1400  vancomycin (VANCOREADY) IVPB 1500 mg/300 mL  Status:  Discontinued        1,500 mg 150 mL/hr over 120 Minutes Intravenous Every 24 hours 06/03/20 0837 06/04/20 1352   06/03/20 1203  piperacillin-tazobactam (ZOSYN) 3.375 GM/50ML IVPB  Status:  Discontinued       Note to Pharmacy: [place], [place]   : cabinet override      06/03/20 1203 06/03/20 1335   06/02/20 1400  vancomycin (VANCOCIN) IVPB 1000 mg/200 mL premix  Status:  Discontinued        1,000 mg 200 mL/hr over 60 Minutes Intravenous Every 24 hours 06/02/20 1329 06/03/20 0837   06/02/20 1400  piperacillin-tazobactam (ZOSYN) IVPB 3.375 g  Status:  Discontinued        3.375 g 12.5 mL/hr over 240 Minutes Intravenous Every 8 hours 06/02/20 1333 06/03/20 0833   06/02/20 1245  vancomycin (VANCOCIN) IVPB 1000 mg/200 mL premix        1,000 mg 200 mL/hr over 60 Minutes Intravenous  Once 06/02/20 1233 06/02/20 1402   06/02/20 1100  ceFEPIme (MAXIPIME) 2 g in sodium chloride 0.9 % 100 mL IVPB        2 g 200 mL/hr over 30 Minutes Intravenous  Once 06/02/20 1048  06/02/20 1300      Current Facility-Administered Medications  Medication Dose Route Frequency Provider Last Rate Last Admin  . acetaminophen (TYLENOL) tablet 650 mg  650 mg Oral Q6H PRN Stoioff, Scott C, MD   650 mg at 06/03/20 1110  . ceFEPIme (MAXIPIME) 2 g in sodium chloride 0.9 % 100 mL IVPB  2 g Intravenous Q8H Dallie Piles, RPH      . Chlorhexidine Gluconate Cloth 2 % PADS 6 each  6 each Topical Daily Stoioff, Ronda Fairly, MD   6 each at 06/04/20 2245  . HYDROmorphone (DILAUDID) injection 0.5 mg  0.5 mg Intravenous Q4H PRN Stoioff, Scott C, MD      . insulin aspart (novoLOG) injection 0-5 Units  0-5 Units Subcutaneous QHS Abbie Sons, MD   2 Units at 06/04/20 2148  . insulin aspart (novoLOG) injection 0-9 Units  0-9 Units Subcutaneous TID WC Stoioff, Ronda Fairly, MD   2 Units at 06/05/20 0844  . insulin aspart (novoLOG) injection 4 Units  4 Units Subcutaneous TID WC Nicole Kindred A, DO   4 Units at 06/05/20 0844  . insulin glargine (LANTUS) injection 10 Units  10 Units Subcutaneous QHS Nicole Kindred A, DO   10 Units  at 06/04/20 2148  . melatonin tablet 5 mg  5 mg Oral QHS PRN Abbie Sons, MD   5 mg at 06/04/20 2151  . metoprolol tartrate (LOPRESSOR) injection 2.5 mg  2.5 mg Intravenous Q4H PRN Stoioff, Scott C, MD      . ondansetron (ZOFRAN) injection 4 mg  4 mg Intravenous Q6H PRN Stoioff, Scott C, MD      . oxyCODONE (Oxy IR/ROXICODONE) immediate release tablet 5 mg  5 mg Oral Q4H PRN Stoioff, Scott C, MD   5 mg at 06/05/20 0430  . senna (SENOKOT) tablet 8.6 mg  1 tablet Oral QHS Stoioff, Scott C, MD   8.6 mg at 06/03/20 2216  . Warfarin - Pharmacist Dosing Inpatient   Does not apply q1600 Ezekiel Slocumb, DO   Given at 06/04/20 1718   Objective: Vital signs in last 24 hours: Temp:  [97.8 F (36.6 C)-98.5 F (36.9 C)] 97.9 F (36.6 C) (04/21 0810) Pulse Rate:  [49-60] 49 (04/21 0810) Resp:  [16-18] 16 (04/21 0810) BP: (119-141)/(59-71) 128/60 (04/21 0810) SpO2:  [97  %-100 %] 97 % (04/21 0810)  Intake/Output from previous day: 04/20 0701 - 04/21 0700 In: 840 [P.O.:840] Out: 1200 [Urine:1200] Intake/Output this shift: Total I/O In: -  Out: 300 [Urine:300]  Physical Exam Vitals and nursing note reviewed.  Constitutional:      General: He is not in acute distress.    Appearance: He is not ill-appearing, toxic-appearing or diaphoretic.  HENT:     Head: Normocephalic and atraumatic.  Pulmonary:     Effort: Pulmonary effort is normal. No respiratory distress.  Genitourinary:    Comments: Wound base visualized with healthy granulation tissue throughout.  No evidence of necrosis or purulence. Skin:    General: Skin is warm and dry.  Neurological:     Mental Status: He is alert and oriented to person, place, and time.  Psychiatric:        Mood and Affect: Mood normal.        Behavior: Behavior normal.    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  Patient continues to clinically improve today.  I performed a wet-to-dry dressing change at the bedside this morning.   Recommendations: -Continue empiric antibiotics and follow culture -Continue Foley catheter x1 week with plans for outpatient Foley removal -Twice daily wet-to-dry dressing changes with nursing starting tomorrow  Debroah Loop, PA-C 06/05/2020

## 2020-06-06 LAB — GLUCOSE, CAPILLARY
Glucose-Capillary: 171 mg/dL — ABNORMAL HIGH (ref 70–99)
Glucose-Capillary: 193 mg/dL — ABNORMAL HIGH (ref 70–99)

## 2020-06-06 LAB — COMPREHENSIVE METABOLIC PANEL
ALT: 91 U/L — ABNORMAL HIGH (ref 0–44)
AST: 41 U/L (ref 15–41)
Albumin: 2.5 g/dL — ABNORMAL LOW (ref 3.5–5.0)
Alkaline Phosphatase: 59 U/L (ref 38–126)
Anion gap: 7 (ref 5–15)
BUN: 18 mg/dL (ref 8–23)
CO2: 24 mmol/L (ref 22–32)
Calcium: 8.6 mg/dL — ABNORMAL LOW (ref 8.9–10.3)
Chloride: 109 mmol/L (ref 98–111)
Creatinine, Ser: 0.96 mg/dL (ref 0.61–1.24)
GFR, Estimated: >60 mL/min (ref >60)
Glucose, Bld: 155 mg/dL — ABNORMAL HIGH (ref 70–99)
Potassium: 3.8 mmol/L (ref 3.5–5.1)
Sodium: 140 mmol/L (ref 135–145)
Total Bilirubin: 0.6 mg/dL (ref 0.3–1.2)
Total Protein: 5.6 g/dL — ABNORMAL LOW (ref 6.5–8.1)

## 2020-06-06 LAB — PROTIME-INR
INR: 2.1 — ABNORMAL HIGH (ref 0.8–1.2)
Prothrombin Time: 23.8 seconds — ABNORMAL HIGH (ref 11.4–15.2)

## 2020-06-06 MED ORDER — WARFARIN SODIUM 2.5 MG PO TABS
2.5000 mg | ORAL_TABLET | Freq: Once | ORAL | Status: DC
  Filled 2020-06-06: qty 1

## 2020-06-06 MED ORDER — OXYCODONE HCL 5 MG PO TABS: 5.0000 mg | ORAL_TABLET | ORAL | 0 refills | Status: AC | PRN

## 2020-06-06 MED ORDER — LEVOFLOXACIN 750 MG PO TABS: 750.0000 mg | ORAL_TABLET | Freq: Every day | ORAL | 0 refills | Status: AC

## 2020-06-06 NOTE — Anesthesia Postprocedure Evaluation (Signed)
Anesthesia Post Note  Patient: Dustin Estrada  Procedure(s) Performed: REMOVAL OF PENILE PROSTHESIS (N/A ) CYSTOSCOPY FLEXIBLE  Patient location during evaluation: PACU Anesthesia Type: General Level of consciousness: awake and alert and oriented Pain management: pain level controlled Vital Signs Assessment: post-procedure vital signs reviewed and stable Respiratory status: spontaneous breathing Cardiovascular status: blood pressure returned to baseline Anesthetic complications: no   No complications documented.   Last Vitals:  Vitals:   06/05/20 1917 06/06/20 0434  BP: (!) 142/58 126/61  Pulse: (!) 52 (!) 51  Resp: 17 14  Temp: 36.8 C 36.7 C  SpO2: 100% 99%    Last Pain:  Vitals:   06/06/20 0552  TempSrc:   PainSc: Asleep                 ,

## 2020-06-06 NOTE — Progress Notes (Signed)
Discharge paperwork and follow-up care reviewed with patient and wife. they demonstrated proficiency with understanding of dressing changes and foley care. Switched to leg bag prior to d/c. Sent home with supplies for dressing changes. All questions answered.

## 2020-06-06 NOTE — Consult Note (Signed)
Pharmacy Antibiotic Note  OLEY LAHAIE is a 75 y.o. male with medical history including diabetes, HTN, history of penile implant admitted on 06/02/2020 with groin swelling / penile discharge. Onset 3-4 days prior to presentation. Per EDP note, no trauma to area. Ultrasound with "fluid collection around penile implant hardware". CT scan with "2 complex fluid collections in the scrotum which are separate from the testes and surrounding the reservoir and tubing for the patient's penile prosthesis. Both of these collections demonstrate internal air bubbles and rim enhancement suspected for infected fluid". Pharmacy has been consulted for cefepime dosing.    Today, 06/06/2020 Day #5 antibiotics.  4/21: WBC  9.0  Tm 98.1 F  Renal: SCr 1.00 >> 0.96  Scrotal abscess cx: ABUNDANT MORGANELLA MORGANII,  F/u sensitivities  Plan:  Continue cefepime 2 grams IV every 8 hours   Monitor renal function   Follow-up Blood cultures results  Await OR culture findings  Height: 5\' 8"  (172.7 cm) Weight: 97.5 kg (214 lb 15.2 oz) IBW/kg (Calculated) : 68.4  Temp (24hrs), Avg:98.2 F (36.8 C), Min:98.1 F (36.7 C), Max:98.4 F (36.9 C)  Recent Labs  Lab 06/02/20 0910 06/03/20 0434 06/05/20 0348 06/06/20 0456  WBC 13.1* 10.8* 9.0  --   CREATININE 1.31* 1.33* 1.00 0.96  LATICACIDVEN 1.9  --   --   --     Estimated Creatinine Clearance: 78.7 mL/min (by C-G formula based on SCr of 0.96 mg/dL).    No Known Allergies  Antimicrobials this admission: Vancomycin 4/18 x 1 Zosyn 4/18 >> 4/19 Cefepime 4/18  >>  Microbiology results: 4/20 BCx: NG < 12 hours 4/19 Scrotal wound: Ab GNR=MORGANELLA MORGANII 4/18 BCx: 1/2 sets (both bottles) GPC, BCID = Staph epidermidis, Staph hominis 4/18 UCx: NG 4/18 MRSA PCR: Negative  Thank you for allowing pharmacy to be a part of this patient's care.  Vallery Sa, PharmD 06/06/2020 9:21 AM

## 2020-06-06 NOTE — Progress Notes (Signed)
[place] for Warfarin Indication: atrial fibrillation  Patient Measurements: Height: 5\' 8"  (172.7 cm) Weight: 97.5 kg (214 lb 15.2 oz) IBW/kg (Calculated) : 68.4  Vital Signs: Temp: 98.1 F (36.7 C) (04/22 0821) Temp Source: Oral (04/22 0434) BP: 148/75 (04/22 0821) Pulse Rate: 50 (04/22 0821)  Labs: Recent Labs    06/04/20 1247 06/05/20 0348 06/06/20 0456  HGB  --  10.8*  --   HCT  --  32.7*  --   PLT  --  225  --   LABPROT 21.9* 22.7* 23.8*  INR 1.9* 2.0* 2.1*  CREATININE  --  1.00 0.96    Estimated Creatinine Clearance: 78.7 mL/min (by C-G formula based on SCr of 0.96 mg/dL).   Medical History: Past Medical History:  Diagnosis Date  . Arthritis   . Diabetes mellitus without complication ([place])    type 2  . GERD (gastroesophageal reflux disease)   . Hx of blood clots Nov 2016   lung  . Hypertension   . Shortness of breath dyspnea    on exertion  . Sleep apnea    cpap    Medications:  Scheduled:  . Chlorhexidine Gluconate Cloth  6 each Topical Daily  . insulin aspart  0-5 Units Subcutaneous QHS  . insulin aspart  0-9 Units Subcutaneous TID WC  . insulin aspart  4 Units Subcutaneous TID WC  . insulin glargine  10 Units Subcutaneous QHS  . polyethylene glycol  17 g Oral Daily  . senna  1 tablet Oral QHS  . Warfarin - Pharmacist Dosing Inpatient   Does not apply q1600   Infusions:  . ceFEPime (MAXIPIME) IV 2 g (06/06/20 2229)    Assessment: 75 yo M to resume Warfarin for Afib s/p removal of penile prosthesis and I&D of scrotal abscesses 4/19 evening. Home Warfarin dose: 2.5 mg all days except Wed, 5 mg on Wednesdays  Date   INR   Warfarin dose 4/18      none (5mg  po vit k) 4/19   2.2   none (2.5mg  po vit k) 4/20   1.9   5 mg 4/21   2.0   2.5 mg 4/22   2.1   DDIs: cefepime  Goal of Therapy:  INR 2-3 Monitor platelets by anticoagulation protocol: Yes   Plan:   Warfarin 2.5 mg po x 1 again today  CBC  per protocol, INR daily  , A 06/06/2020,9:16 AM

## 2020-06-06 NOTE — TOC Initial Note (Signed)
Transition of Care [facility]) - Initial/Assessment Note    Patient Details  Name: Dustin Estrada MRN: [id number] Date of Birth: [birth year]  [facility] CM/SW Contact:    Shelbie Hutching, RN Phone Number: 06/06/2020, 12:25 PM  Clinical Narrative:                 Patient admitted to the hospital with scrotal abscess and infection.  Patient underwent surgery with urology on 4/19 to remove a penile implant.  Patient is medically cleared for discharge home today per provider.  Patient will be going home with wound care and a foley catheter.  RNCM met with patient and wife at the bedside.  Wife changed the dressing this morning and they are both comfortable going home and confident about dressing changes.  Patient does not want home health at this time, wife feels she can take care of the wound and foley.  Patient will follow up with urology outpatient.   Wife, Jana Half, will transport patient home today at discharge.  Patient has no equipment needs, he is independent at home.  Current with PCP.   Expected Discharge Plan: Home/Self Care Barriers to Discharge: No Barriers Identified   Patient Goals and CMS Choice Patient states their goals for this hospitalization and ongoing recovery are:: Will be glad to get out of the hospital, ready to start getting up an about      Expected Discharge Plan and Services Expected Discharge Plan: Home/Self Care       Living arrangements for the past 2 months: Single Family Home                 DME Arranged: N/A DME Agency: NA       HH Arranged: Patient Refused [place] Agency: NA        Prior Living Arrangements/Services Living arrangements for the past 2 months: Single Family Home Lives with:: Spouse Patient language and need for interpreter reviewed:: Yes Do you feel safe going back to the place where you live?: Yes      Need for Family Participation in Patient Care: Yes (Comment) Care giver support system in place?: Yes (comment)  (wife)   Criminal Activity/Legal Involvement Pertinent to Current Situation/Hospitalization: No - Comment as needed  Activities of Daily Living Home Assistive Devices/Equipment: None ADL Screening (condition at time of admission) Patient's cognitive ability adequate to safely complete daily activities?: Yes Is the patient deaf or have difficulty hearing?: No Does the patient have difficulty seeing, even when wearing glasses/contacts?: No Does the patient have difficulty concentrating, remembering, or making decisions?: No Patient able to express need for assistance with ADLs?: Yes Does the patient have difficulty dressing or bathing?: No Independently performs ADLs?: Yes (appropriate for developmental age) Does the patient have difficulty walking or climbing stairs?: No Weakness of Legs: None Weakness of Arms/Hands: None  Permission Sought/Granted Permission sought to share information with : Case Manager,Family Supports Permission granted to share information with : Yes, Verbal Permission Granted  Share Information with NAME: Jana Half     Permission granted to share info w Relationship: wife     Emotional Assessment Appearance:: Appears stated age Attitude/Demeanor/Rapport: Engaged Affect (typically observed): Accepting Orientation: : Oriented to Self,Oriented to Place,Oriented to  Time,Oriented to Situation Alcohol / Substance Use: Not Applicable Psych Involvement: No (comment)  Admission diagnosis:  Scrotal infection [N49.2] Patient Active Problem List   Diagnosis Date Noted  . Scrotal infection 06/02/2020  . Hematoma of leg 11/02/2016  . Special  screening for malignant neoplasms, colon   . Benign neoplasm of ascending colon   . Benign neoplasm of transverse colon   . Preop examination   . Morbid obesity ([place])   . Hiatal hernia   . Gastritis   . Type 2 diabetes mellitus ([place]) 07/21/2015  . Acid reflux 07/21/2015  . Morbid (severe) obesity due to excess calories ([place])  07/21/2015  . Arthritis, degenerative 07/21/2015  . Diabetes mellitus ([place]) 07/24/2013  . BP (high blood pressure) 07/24/2013  . HLD (hyperlipidemia) 07/24/2013   PCP:  [name], MD Pharmacy:   [facility] [address] [place]) [place] [address] Phone: [phone] Fax: [phone]     Social Determinants of Health (SDOH) Interventions    Readmission Risk Interventions No flowsheet data found.

## 2020-06-06 NOTE — Discharge Summary (Signed)
Physician Discharge Summary  Dustin Estrada AXK:[id number] DOB: 11-17-1945 DOA: 06/02/2020  PCP: [name], MD  Admit date: 06/02/2020 Discharge date: 06/14/2020  Admitted From: home Disposition:  home  Recommendations for Outpatient Follow-up:  1. Follow up with PCP in 1-2 weeks 2. Please obtain BMP/CBC in one week 3. Please follow up with urology next week 4. Home health nurse will follow up in the home and monitor wounds & dressing changes  Home Health: RN  Equipment/Devices: None   Discharge Condition: Stable  CODE STATUS: Full  Diet recommendation: Heart Healthy / Carb Modified     Discharge Diagnoses: Active Problems:   Scrotal infection    Summary of HPI and Hospital Course:  Dustin Estrada is a 75 y.o. male with medical history significant for penile implant, type 2 diabetes, essential hypertension, chronic A. fib on Coumadin who presented to [facility] ED due to worsening scrotal swelling and discharge.  Onset on Thursday afternoon, 5 days prior to presentation.  Associated with erythema and pain with ambulation.  On Friday and Saturday his underwears were soaked with purulent discharge.  Had a fever of 102 on Saturday and on Sunday a fever of 101, took Tylenol.  Due to worsening symptoms his wife made him come to the ED.  CT abdomen and pelvis with contrast in the ED revealed 2 complex fluid collection in the scrotum which are separate from the testes and surrounding the reservoir and tubing for the patient's penile prosthesis.  Started on IV antibiotics empirically in the ED.  Urology was consulted by EDP. Taken to the OR on 4/19 with urology for cystoscopy, removal of penile prosthesis and I&D of scrotal abscesses.  Treated with empiric broad spectrum IV vancomycin, cefepime.   2 bottles of preliminary blood culture grew staph epi and staph hominis, possibly contamination.  Repeat blood culture obtained and negative to date at time of discharge.  Abscess cultures growing  morganelli spp, susceptiblities pending.  Dustin Estrada clinically improved on Cefepime. Discharged with 7 additional days of Levaquin.   Follow up on final culture susceptibilities.    Patient and wife were shown how to change dressings and both expressed being comfortable doing to twice daily at home.     Sepsis secondary to infected penile prosthesis with secondary scrotal abscess met for sepsis on admission with fever, leukocytosis, AKI consistent with organ dysfunction in the presence of new infection.   Initially started on Vanco and Zosyn.   Zosyn was transitioned to Cefepime. DC Vanco.   Staph species on initial blood cultures suspicious for contamination. -Repeat blood cultures - neg to date -Follow abscess culture susceptibilities -Supportive care: Pain control as needed, antiemetics -Continue Foley x1 week per urology, follow up outpatient -Twice daily wet-to-dry dressing changes at home -Home health RN to follow as well  UTI - ruled out - UA had pyuria which was most likely from the scrotal infection.  Patient had no dysuria.  Urine culture with no growth.  Given antibiotics for abscess as above.  Chronic A. Fib -anticoagulated on warfarin which was held for surgery yesterday.  Discussed with urology postoperatively and okay to resume warfarin.  Pharmacy was consulted for warfarin dosing in hospital.   --Continue warfarin --Follow up for INR check  Acute kidney injury -present on admission likely due to sepsis and dehydration.  Resolved with IV hydration.  Baseline creatinine around 1. -- Cr stable off IV fluids --Recheck BMP in follow up  Obesity: Body mass index is 32.68 kg/m.  Complicates overall care and prognosis.  Recommend lifestyle modifications including physical activity and diet for weight loss and overall long-term health.  Transaminitis - Suspect secondary to sepsis.  May be worsened due to Cefepime.  CT abdomen pelvis negative for any acute liver abnormalities.  --Lipitor was held initially, resumed as LFT's improved and more likely due to acute issues -- Repeat LFTs in follow up  Essential hypertension -BP controlled. Home losartan held due to AKI, resumed once renal function improved.  PCP follow up.    Discharge Instructions   Discharge Instructions    Call MD for:  extreme fatigue   Complete by: As directed    Call MD for:  persistant dizziness or light-headedness   Complete by: As directed    Call MD for:  persistant nausea and vomiting   Complete by: As directed    Call MD for:  severe uncontrolled pain   Complete by: As directed    Call MD for:  temperature >100.4   Complete by: As directed    Diet - low sodium heart healthy   Complete by: As directed    Discharge instructions   Complete by: As directed    Take antibiotic (Levaquin / levofloxacin) for 7 more days.  Urology will follow up with you next week to take out the Foley catheter.  Use pain medications as needed.  Recommend taking stool softeners or laxatives regularly while using pain medications.  Be sure to dressing changes twice daily.  Home health nurse will assist in monitoring the wound and can help if any issues with dressing changes.   Discharge wound care:   Complete by: As directed    Continue wet-to-dry dressing changes twice daily.   Increase activity slowly   Complete by: As directed      Allergies as of 06/06/2020   No Known Allergies     Medication List    TAKE these medications   atorvastatin 20 MG tablet Commonly known as: LIPITOR Take 20 mg by mouth every evening.   b complex vitamins tablet Take 1 tablet by mouth daily.   CALCIUM 600+D3 PO Take 1 tablet by mouth daily.   docusate sodium 100 MG capsule Commonly known as: COLACE Take by mouth.   insulin lispro 100 UNIT/ML KwikPen Commonly known as: HUMALOG Inject 15 Units into the skin 2 (two) times daily before lunch and supper.   losartan 50 MG tablet Commonly known as:  COZAAR Take 50 mg by mouth every evening.   MULTIVITAMIN PO Take 2 tablets by mouth daily.   Toujeo SoloStar 300 UNIT/ML Solostar Pen Generic drug: insulin glargine (1 Unit Dial) Inject 30 Units into the skin every evening.   warfarin 5 MG tablet Commonly known as: COUMADIN Take 2.5-5 mg by mouth See admin instructions. Take 5 mg by mouth daily on Wednesday. Take 2.5 mg by mouth daily on all other days.     ASK your doctor about these medications   levofloxacin 750 MG tablet Commonly known as: Levaquin Take 1 tablet (750 mg total) by mouth daily for 7 days. Ask about: Should I take this medication?   oxyCODONE 5 MG immediate release tablet Commonly known as: Oxy IR/ROXICODONE Take 1 tablet (5 mg total) by mouth every 4 (four) hours as needed for up to 7 days for moderate pain or severe pain. Ask about: Should I take this medication?            Discharge Care Instructions  (From admission, onward)  Start     Ordered   06/06/20 0000  Discharge wound care:       Comments: Continue wet-to-dry dressing changes twice daily.   06/06/20 1241          No Known Allergies   If you experience worsening of your admission symptoms, develop shortness of breath, life threatening emergency, suicidal or homicidal thoughts you must seek medical attention immediately by calling 911 or calling your MD immediately  if symptoms less severe.    Please note   You were cared for by a hospitalist during your hospital stay. If you have any questions about your discharge medications or the care you received while you were in the hospital after you are discharged, you can call the unit and asked to speak with the hospitalist on call if the hospitalist that took care of you is not available. Once you are discharged, your primary care physician will handle any further medical issues. Please note that NO REFILLS for any discharge medications will be authorized once you are discharged, as  it is imperative that you return to your primary care physician (or establish a relationship with a primary care physician if you do not have one) for your aftercare needs so that they can reassess your need for medications and monitor your lab values.   Consultations:  Urology   Procedures/Studies: CT ABDOMEN PELVIS W CONTRAST  Result Date: 06/02/2020 CLINICAL DATA:  Scrotal swelling for 4 days. History of penile implant times 10 years with abnormal scrotal ultrasound. Abdominal abscess/infection suspected. History of cholecystectomy, hiatal hernia and gastric surgeries in 2017. EXAM: CT ABDOMEN AND PELVIS WITH CONTRAST TECHNIQUE: Multidetector CT imaging of the abdomen and pelvis was performed using the standard protocol following bolus administration of intravenous contrast. CONTRAST:  170m OMNIPAQUE IOHEXOL 300 MG/ML  SOLN COMPARISON:  Scrotal ultrasound same date. FINDINGS: Lower chest: Clear lung bases. No significant pleural or pericardial effusion. Hepatobiliary: The liver is normal in density without suspicious focal abnormality. No significant biliary dilatation post cholecystectomy. Pancreas: Unremarkable. No pancreatic ductal dilatation or surrounding inflammatory changes. Spleen: Normal in size without focal abnormality. Adrenals/Urinary Tract: Both adrenal glands appear normal. There are small nonobstructing bilateral renal calculi with mild nonspecific symmetric perinephric soft tissue stranding bilaterally. No evidence of ureteral calculus or hydronephrosis. There is a small cyst in the interpolar region of the right kidney. The bladder appears unremarkable. Stomach/Bowel: No enteric contrast administered. There are postsurgical changes in the stomach consistent with partial gastrectomy and gastric bypass procedure. No evidence of bowel wall thickening, distention or surrounding inflammation. The appendix appears normal. There are mild diverticular changes in the sigmoid colon.  Vascular/Lymphatic: There are no enlarged abdominal or pelvic lymph nodes. Mild aortic and branch vessel atherosclerosis. No acute vascular findings. Reproductive: The prostate gland is mildly enlarged. Patient has a penile prosthesis, the reservoir for which appears to be located posteriorly in the scrotum. The reservoir appears decompressed with a surrounding fluid collection measuring 8.1 x 6.0 x 6.2 cm. This collection demonstrates internal air bubbles and peripheral enhancement, suspicious for an infected fluid collection. There is an additional complex fluid collection more anteriorly in the scrotum surrounding the tubing of the penile prosthesis, measuring 7.9 x 6.2 x 9.3 cm. This also demonstrates internal air bubbles and peripheral enhancement. Separate from these collections are small bilateral hydroceles as seen on preceding ultrasound. No inflammatory changes identified in the pelvis. Other: Subcutaneous edema in the anterior abdominal wall, likely related to subcutaneous injections. Small left  inguinal hernia containing only fat. No ascites. Musculoskeletal: No acute or significant osseous findings. Multilevel lumbar spondylosis. Bilateral L5 pars defects with grade 1 anterolisthesis and moderate foraminal narrowing bilaterally at L5-S1. Probable hemangioma in the L1 vertebral body. IMPRESSION: 1. There are 2 complex fluid collections in the scrotum which are separate from the testes and surrounding the reservoir and tubing for the patient's penile prosthesis. Both of these collections demonstrate internal air bubbles and rim enhancement suspected for infected fluid. Urology consultation recommended. 2. No evidence of intrapelvic or generalized peritoneal inflammation. 3. Nonobstructing bilateral renal calculi. 4. Bilateral L5 pars defects with resulting grade 1 anterolisthesis and moderate biforaminal stenosis at L5-S1. 5.  Aortic Atherosclerosis (ICD10-I70.0). Electronically Signed   By: Richardean Sale M.D.   On: 06/02/2020 11:45   US SCROTUM W/DOPPLER  Result Date: 06/02/2020 CLINICAL DATA:  Scrotal infection. EXAM: SCROTAL ULTRASOUND DOPPLER ULTRASOUND OF THE TESTICLES TECHNIQUE: Complete ultrasound examination of the testicles, epididymis, and other scrotal structures was performed. Color and spectral Doppler ultrasound were also utilized to evaluate blood flow to the testicles. COMPARISON:  None. FINDINGS: Right testicle Measurements: 53 x 20 x 30 mm.  No mass or abnormal vascularity Left testicle Measurements:  45 x 26 x 32 mm.  No mass or abnormal vascularity Right epididymis:  Normal in size and appearance. Left epididymis:  Normal in size and appearance. Hydrocele:  Simple hydrocele. Varicocele:  None visualized. Pulsed Doppler interrogation of both testes demonstrates normal low resistance arterial and venous waveforms bilaterally. Other: Penile implant with mid level echoes encapsulated around the tubing in the perineum. Gas is also present and likely within the implant reservoir or, CT would better define the extent and location of fluid/gas in and out of the implant. IMPRESSION: 1. Fluid collection around penile implant hardware, recommend CT with contrast. 2. Negative testicles. Electronically Signed   By: Monte Fantasia M.D.   On: 06/02/2020 10:42       Subjective: Dustin Estrada seen, wife at bedside.  He reports good pain control, but does have pain with moving around and with  More upright posture.  No fever chills or dysuria.  He and wife comfortable with dressing changes after working with Therapist, sports.   Discharge Exam: Vitals:   06/06/20 0821 06/06/20 1256  BP: (!) 148/75 (!) 149/62  Pulse: (!) 50 63  Resp: 18 18  Temp: 98.1 F (36.7 C) 98.3 F (36.8 C)  SpO2: 99% 99%   Vitals:   06/05/20 1917 06/06/20 0434 06/06/20 0821 06/06/20 1256  BP: (!) 142/58 126/61 (!) 148/75 (!) 149/62  Pulse: (!) 52 (!) 51 (!) 50 63  Resp: _0 Temp: 98.2 F (36.8 C) 98.1 F (36.7 C) 98.1 F  (36.7 C) 98.3 F (36.8 C)  TempSrc:  Oral  Oral  SpO2: 100% 99% 99% 99%  Weight:      Height:        General: Dustin Estrada is alert, awake, not in acute distress, obese Cardiovascular: RRR, S1/S2 +, no rubs, no gallops Respiratory: CTA bilaterally, no wheezing, no rhonchi Abdominal: Soft, NT, ND, bowel sounds + Extremities: no edema, no cyanosis    The results of significant diagnostics from this hospitalization (including imaging, microbiology, ancillary and laboratory) are listed below for reference.     Microbiology: Recent Results (from the past 240 hour(s))  Culture, blood (single) w Reflex to ID Panel     Status: None   Collection Time: 06/04/20  5:43 PM   Specimen: BLOOD  Result Value Ref Range Status   Specimen Description BLOOD RIGHT ANTECUBITAL  Final   Special Requests   Final    BOTTLES DRAWN AEROBIC AND ANAEROBIC Blood Culture adequate volume   Culture   Final    NO GROWTH 5 DAYS Performed at [facility], [address], [place], [place] [address]    Report Status 06/09/2020 FINAL  Final     Labs: BNP (last 3 results) No results for input(s): BNP in the last 8760 hours. Basic Metabolic Panel: No results for input(s): NA, K, CL, CO2, GLUCOSE, BUN, CREATININE, CALCIUM, MG, PHOS in the last 168 hours. Liver Function Tests: No results for input(s): AST, ALT, ALKPHOS, BILITOT, PROT, ALBUMIN in the last 168 hours. No results for input(s): LIPASE, AMYLASE in the last 168 hours. No results for input(s): AMMONIA in the last 168 hours. CBC: No results for input(s): WBC, NEUTROABS, HGB, HCT, MCV, PLT in the last 168 hours. Cardiac Enzymes: No results for input(s): CKTOTAL, CKMB, CKMBINDEX, TROPONINI in the last 168 hours. BNP: Invalid input(s): POCBNP CBG: No results for input(s): GLUCAP in the last 168 hours. D-Dimer No results for input(s): DDIMER in the last 72 hours. Hgb A1c No results for input(s): HGBA1C in the last 72 hours. Lipid Profile No results  for input(s): CHOL, HDL, LDLCALC, TRIG, CHOLHDL, LDLDIRECT in the last 72 hours. Thyroid function studies No results for input(s): TSH, T4TOTAL, T3FREE, THYROIDAB in the last 72 hours.  Invalid input(s): FREET3 Anemia work up No results for input(s): VITAMINB12, FOLATE, FERRITIN, TIBC, IRON, RETICCTPCT in the last 72 hours. Urinalysis    Component Value Date/Time   COLORURINE YELLOW (A) 06/02/2020 0859   APPEARANCEUR HAZY (A) 06/02/2020 0859   APPEARANCEUR Clear 10/28/2013 0816   LABSPEC >1.046 (H) 06/02/2020 0859   LABSPEC 1.020 10/28/2013 0816   PHURINE 5.0 06/02/2020 0859   GLUCOSEU 50 (A) 06/02/2020 0859   GLUCOSEU 150 mg/dL 10/28/2013 0816   HGBUR MODERATE (A) 06/02/2020 0859   BILIRUBINUR NEGATIVE 06/02/2020 0859   BILIRUBINUR Negative 10/28/2013 0816   KETONESUR 20 (A) 06/02/2020 0859   PROTEINUR 30 (A) 06/02/2020 0859   NITRITE NEGATIVE 06/02/2020 0859   LEUKOCYTESUR LARGE (A) 06/02/2020 0859   LEUKOCYTESUR Negative 10/28/2013 0816   Sepsis Labs Invalid input(s): PROCALCITONIN,  WBC,  LACTICIDVEN Microbiology Recent Results (from the past 240 hour(s))  Culture, blood (single) w Reflex to ID Panel     Status: None   Collection Time: 06/04/20  5:43 PM   Specimen: BLOOD  Result Value Ref Range Status   Specimen Description BLOOD RIGHT ANTECUBITAL  Final   Special Requests   Final    BOTTLES DRAWN AEROBIC AND ANAEROBIC Blood Culture adequate volume   Culture   Final    NO GROWTH 5 DAYS Performed at [facility], [address] [place], [place] [address]    Report Status 06/09/2020 FINAL  Final     Time coordinating discharge: Over 30 minutes  SIGNED:   Ezekiel Slocumb, DO [facility] 06/14/2020, 3:05 PM   If 7PM-7AM, please contact night-coverage [email or web address]

## 2020-06-06 NOTE — Discharge Instructions (Signed)
>>   Do Not take Calcium or Multivitamin within 2 hours of Levofloxacin >>

## 2020-06-07 LAB — CULTURE, BLOOD (ROUTINE X 2): Culture: NO GROWTH

## 2020-06-09 LAB — AEROBIC/ANAEROBIC CULTURE W GRAM STAIN (SURGICAL/DEEP WOUND)

## 2020-06-09 LAB — CULTURE, BLOOD (SINGLE)
Culture: NO GROWTH
Special Requests: ADEQUATE

## 2020-06-12 ENCOUNTER — Encounter: Payer: Self-pay | Admitting: Physician Assistant

## 2020-06-12 ENCOUNTER — Ambulatory Visit (INDEPENDENT_AMBULATORY_CARE_PROVIDER_SITE_OTHER): Payer: Medicare Other | Admitting: Physician Assistant

## 2020-06-12 ENCOUNTER — Other Ambulatory Visit: Payer: Self-pay

## 2020-06-12 VITALS — BP 130/70 | HR 68 | Ht 67.0 in | Wt 220.0 lb

## 2020-06-12 DIAGNOSIS — N492 Inflammatory disorders of scrotum: Secondary | ICD-10-CM

## 2020-06-12 NOTE — Progress Notes (Signed)
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  Patient reports feeling well today.  He is performing twice daily wet-to-dry dressing changes and wonders if he should continue these.  On physical exam, the wound opening is approximately 8 cm long and tracks about 7 cm deep into the scrotum.  I replaced wet-to-dry gauze into the wound today and demonstrated technique with the patient and his wife.  All questions answered.  Counseled him to continue doing this until the wound has closed through secondary intention such that it is too shallow to except gauze.  They expressed understanding.  Catheter Removal  Patient is present today for a catheter removal.  28ml of water was drained from the balloon. A 16FR foley cath was removed from the bladder no complications were noted . Patient tolerated well.  Performed by: Debroah Loop, PA-C   Follow up/ Additional notes: Return in about 6 weeks (around 07/24/2020) for Wound recheck with Dr. Bernardo Heater.   Debroah Loop, PA-C 06/12/20 5:31 PM  I spent 15 minutes on the day of the encounter to include pre-visit record review, face-to-face time with the patient, and post-visit ordering of tests.

## 2020-06-30 ENCOUNTER — Encounter: Payer: Self-pay | Admitting: Physician Assistant

## 2020-06-30 ENCOUNTER — Other Ambulatory Visit: Payer: Self-pay

## 2020-06-30 ENCOUNTER — Ambulatory Visit (INDEPENDENT_AMBULATORY_CARE_PROVIDER_SITE_OTHER): Payer: Medicare Other | Admitting: Physician Assistant

## 2020-06-30 VITALS — BP 132/75 | HR 57 | Ht 68.0 in | Wt 217.0 lb

## 2020-06-30 DIAGNOSIS — Z5189 Encounter for other specified aftercare: Secondary | ICD-10-CM

## 2020-06-30 NOTE — Patient Instructions (Signed)
Continue your daily wet-to-dry dressing changes, making sure to push the gauze all the way to the base of the wound to keep the walls of the wound from closing together. Continue this until the wound is so shallow that it will no longer accept gauze.

## 2020-06-30 NOTE — Progress Notes (Signed)
06/30/2020 12:04 PM   Dustin Estrada 09-08-1945 [id number]  CC: Chief Complaint  Patient presents with  . Wound Check    HPI: Dustin Estrada is a 75 y.o. male s/p cystoscopy and removal of multicomponent penile prosthesis with incision and drainage of scrotal abscesses on 06/03/2020 with Dr. Bernardo Heater who presents today requesting wound check.  He is accompanied today by his wife, who contributes to HPI.  They have been performing daily wet-to-dry dressing changes as previously instructed.  Wife reports she has noted some white tissue and is concerned for possible infection.  Additionally, they wonder when they will be able to stop packing his wound.  He denies fever, chills, nausea, vomiting, purulent drainage, or new redness, swelling, or pain.   PMH: Past Medical History:  Diagnosis Date  . Arthritis   . Diabetes mellitus without complication ([place])    type 2  . GERD (gastroesophageal reflux disease)   . Hx of blood clots Nov 2016   lung  . Hypertension   . Shortness of breath dyspnea    on exertion  . Sleep apnea    cpap    Surgical History: Past Surgical History:  Procedure Laterality Date  . CARDIAC CATHETERIZATION    . COLONOSCOPY WITH PROPOFOL N/A 07/28/2015   Procedure: COLONOSCOPY WITH PROPOFOL;  Surgeon: Lucilla Lame, MD;  Location: [facility];  Service: Endoscopy;  Laterality: N/A;  Insulin dependent diabetic and CPAP  . CYSTOSCOPY  06/03/2020   Procedure: CYSTOSCOPY FLEXIBLE;  Surgeon: Abbie Sons, MD;  Location: [facility];  Service: Urology;;  . ESOPHAGOGASTRODUODENOSCOPY (EGD) WITH PROPOFOL N/A 07/23/2015   Procedure: ESOPHAGOGASTRODUODENOSCOPY (EGD) WITH PROPOFOL;  Surgeon: Lucilla Lame, MD;  Location: [facility];  Service: Endoscopy;  Laterality: N/A;  . KNEE SURGERY Left   . 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 MASS EXCISION N/A 02/27/2018   Procedure: EXCISION MASS LEFT LEG;  Surgeon: Herbert Pun, MD;  Location: [facility];  Service: General;  Laterality: N/A;  . POLYPECTOMY  07/28/2015   Procedure: POLYPECTOMY;  Surgeon: Lucilla Lame, MD;  Location: [facility];  Service: Endoscopy;;  . REMOVAL OF PENILE PROSTHESIS N/A 06/03/2020   Procedure: REMOVAL OF PENILE PROSTHESIS;  Surgeon: Abbie Sons, MD;  Location: [facility];  Service: Urology;  Laterality: N/A;  . ROTATOR CUFF REPAIR Right     Home Medications:  Allergies as of 06/30/2020   No Known Allergies     Medication List       Accurate as of Jun 30, 2020 12:04 PM. If you have any questions, ask your nurse or doctor.        atorvastatin 20 MG tablet Commonly known as: LIPITOR Take 20 mg by mouth every evening.   b complex vitamins tablet Take 1 tablet by mouth daily.   CALCIUM 600+D3 PO Take 1 tablet by mouth daily.   docusate sodium 100 MG capsule Commonly known as: COLACE Take by mouth.   insulin lispro 100 UNIT/ML KwikPen Commonly known as: HUMALOG Inject 15 Units into the skin 2 (two) times daily before lunch and supper.   losartan 50 MG tablet Commonly known as: COZAAR Take 50 mg by mouth every evening.   MULTIVITAMIN PO Take 2 tablets by mouth daily.   nystatin cream Commonly known as: MYCOSTATIN Apply topically 2 (two) times daily.   Toujeo SoloStar 300 UNIT/ML Solostar Pen Generic drug: insulin glargine (1 Unit Dial) Inject 30 Units into the skin every evening.  warfarin 5 MG tablet Commonly known as: COUMADIN Take 2.5-5 mg by mouth See admin instructions. Take 5 mg by mouth daily on Wednesday. Take 2.5 mg by mouth daily on all other days.       Allergies:  No Known Allergies  Family History: Family History  Problem Relation Age of Onset  . Diabetes Father   . Heart attack Maternal Uncle   . Heart attack Maternal Grandfather     Social History:   reports that he has never smoked. He has never used smokeless tobacco. He reports that he does not drink alcohol and  does not use drugs.  Physical Exam: BP 132/75   Pulse (!) 57   Ht 5\' 8"  (1.727 m)   Wt 217 lb (98.4 kg)   BMI 32.99 kg/m   Constitutional:  Alert and oriented, no acute distress, nontoxic appearing HEENT: [place], AT Cardiovascular: No clubbing, cyanosis, or edema Respiratory: Normal respiratory effort, no increased work of breathing GU: Surgical wound of the left hemiscrotum examined today.  Wound probes approximately 4 cm in depth and wound bed appears to have healthy granulation tissue throughout.  There is a thin margin of hypopigmented tissue at the wound border consistent with scar tissue formation.  He has a thin border of induration surrounding the wound and no fluctuance, redness, or tenderness is noted on physical exam today.  There is some scant serosanguineous drainage from the wound. Skin: No rashes, bruises or suspicious lesions Neurologic: Grossly intact, no focal deficits, moving all 4 extremities Psychiatric: Normal mood and affect  Assessment & Plan:   1. Encounter for wound re-check Wound appears healthy on thorough exam today with no evidence of infection.  I counseled the patient and his wife to continue daily wet-to-dry dressing changes until the wound is too shallow to accept backing.  I explained this is essential to allow the wound to heal through secondary intention.  They expressed understanding.  Return if symptoms worsen or fail to improve.  Debroah Loop, PA-C  [facility] [address] [place], [place] [address] [phone]

## 2020-07-24 ENCOUNTER — Ambulatory Visit (INDEPENDENT_AMBULATORY_CARE_PROVIDER_SITE_OTHER): Payer: Medicare Other | Admitting: Urology

## 2020-07-24 ENCOUNTER — Other Ambulatory Visit: Payer: Self-pay

## 2020-07-24 ENCOUNTER — Encounter: Payer: Self-pay | Admitting: Urology

## 2020-07-24 VITALS — BP 148/74 | HR 86 | Ht 68.0 in | Wt 217.0 lb

## 2020-07-24 DIAGNOSIS — Z09 Encounter for follow-up examination after completed treatment for conditions other than malignant neoplasm: Secondary | ICD-10-CM

## 2020-07-24 NOTE — Progress Notes (Signed)
07/24/2020 9:44 AM   Dustin Estrada [birth year] [id number]  Referring provider: [name], MD [place],  [place] [address]  Chief Complaint  Patient presents with   Other    HPI: 75 y.o. male presents for postop follow-up.  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 The reservoir was in the posterior scrotum on CT. Underwent exploration with incision and drainage of scrotal abscesses surrounding the scrotal pump and reservoir.  He was noted to have a small distal urethral erosion. Discharged with wet-to-dry dressing changes and last saw our PA 06/30/2020 with the site healing nicely He states 5 days ago the wound with no longer all packing and he has no complaints   PMH: Past Medical History:  Diagnosis Date   Arthritis    Diabetes mellitus without complication ([place])    type 2   GERD (gastroesophageal reflux disease)    Hx of blood clots Nov 2016   lung   Hypertension    Shortness of breath dyspnea    on exertion   Sleep apnea    cpap    Surgical History: Past Surgical History:  Procedure Laterality Date   CARDIAC CATHETERIZATION     COLONOSCOPY WITH PROPOFOL N/A 07/28/2015   Procedure: COLONOSCOPY WITH PROPOFOL;  Surgeon: Lucilla Lame, MD;  Location: [facility];  Service: Endoscopy;  Laterality: N/A;  Insulin dependent diabetic and CPAP   CYSTOSCOPY  06/03/2020   Procedure: CYSTOSCOPY FLEXIBLE;  Surgeon: Abbie Sons, MD;  Location: [facility];  Service: Urology;;   ESOPHAGOGASTRODUODENOSCOPY (EGD) WITH PROPOFOL N/A 07/23/2015   Procedure: ESOPHAGOGASTRODUODENOSCOPY (EGD) WITH PROPOFOL;  Surgeon: Lucilla Lame, MD;  Location: [facility];  Service: Endoscopy;  Laterality: N/A;   KNEE SURGERY Left    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 SWITCH)     MASS EXCISION N/A 02/27/2018   Procedure: EXCISION MASS LEFT LEG;  Surgeon: Herbert Pun, MD;  Location: [facility];  Service:  General;  Laterality: N/A;   POLYPECTOMY  07/28/2015   Procedure: POLYPECTOMY;  Surgeon: Lucilla Lame, MD;  Location: [facility];  Service: Endoscopy;;   REMOVAL OF PENILE PROSTHESIS N/A 06/03/2020   Procedure: REMOVAL OF PENILE PROSTHESIS;  Surgeon: Abbie Sons, MD;  Location: [facility];  Service: Urology;  Laterality: N/A;   ROTATOR CUFF REPAIR Right     Home Medications:  Allergies as of 07/24/2020   No Known Allergies      Medication List        Accurate as of July 24, 2020  9:44 AM. If you have any questions, ask your nurse or doctor.          atorvastatin 20 MG tablet Commonly known as: LIPITOR Take 20 mg by mouth every evening.   b complex vitamins tablet Take 1 tablet by mouth daily.   CALCIUM 600+D3 PO Take 1 tablet by mouth daily.   docusate sodium 100 MG capsule Commonly known as: COLACE Take by mouth.   insulin lispro 100 UNIT/ML KwikPen Commonly known as: HUMALOG Inject 15 Units into the skin 2 (two) times daily before lunch and supper.   losartan 50 MG tablet Commonly known as: COZAAR Take 50 mg by mouth every evening.   MULTIVITAMIN PO Take 2 tablets by mouth daily.   nystatin cream Commonly known as: MYCOSTATIN Apply topically 2 (two) times daily.   Toujeo SoloStar 300 UNIT/ML Solostar Pen Generic drug: insulin glargine (1 Unit Dial) Inject 30 Units into the skin every evening.  warfarin 5 MG tablet Commonly known as: COUMADIN Take 2.5-5 mg by mouth See admin instructions. Take 5 mg by mouth daily on Wednesday. Take 2.5 mg by mouth daily on all other days.        Allergies: No Known Allergies  Family History: Family History  Problem Relation Age of Onset   Diabetes Father    Heart attack Maternal Uncle    Heart attack Maternal Grandfather     Social History:  reports that he has never smoked. He has never used smokeless tobacco. He reports that he does not drink alcohol and does not use drugs.   Physical Exam: BP  (!) 148/74   Pulse 86   Ht 5\' 8"  (1.727 m)   Wt 217 lb (98.4 kg)   BMI 32.99 kg/m   Constitutional:  Alert and oriented, No acute distress. HEENT: [place] AT, moist mucus membranes.  Trachea midline, no masses. Cardiovascular: No clubbing, cyanosis, or edema. Respiratory: Normal respiratory effort, no increased work of breathing. GU: I&D site healing nicely without granulation tissue.  Almost completely healed   Assessment & Plan:    Doing well status post removal of infected multicomponent penile prosthesis Follow-up as needed  Abbie Sons, MD  [facility] [address], [place], [place] [address] [phone]

## 2020-07-25 ENCOUNTER — Ambulatory Visit: Payer: Self-pay | Admitting: Urology

## 2020-10-21 DIAGNOSIS — F411 Generalized anxiety disorder: Secondary | ICD-10-CM | POA: Insufficient documentation

## 2020-10-29 ENCOUNTER — Telehealth: Payer: Self-pay | Admitting: Urology

## 2020-10-29 NOTE — Telephone Encounter (Signed)
Patient called 10/28/20 asking for a referral to [facility] to discuss a penile implant. Referral was faxed to [facility] on 10/29/20.  Sharyn Lull

## 2021-01-01 DIAGNOSIS — I480 Paroxysmal atrial fibrillation: Secondary | ICD-10-CM | POA: Diagnosis present

## 2021-07-04 ENCOUNTER — Encounter: Payer: Self-pay | Admitting: Emergency Medicine

## 2021-07-04 ENCOUNTER — Emergency Department
Admission: EM | Admit: 2021-07-04 | Discharge: 2021-07-04 | Disposition: A | Discharge status: Home / Self Care | Payer: Medicare Other | Attending: Emergency Medicine | Admitting: Emergency Medicine

## 2021-07-04 ENCOUNTER — Emergency Department: Payer: Medicare Other

## 2021-07-04 DIAGNOSIS — E119 Type 2 diabetes mellitus without complications: Secondary | ICD-10-CM | POA: Insufficient documentation

## 2021-07-04 DIAGNOSIS — R0602 Shortness of breath: Secondary | ICD-10-CM | POA: Diagnosis present

## 2021-07-04 DIAGNOSIS — Z20822 Contact with and (suspected) exposure to covid-19: Secondary | ICD-10-CM | POA: Diagnosis not present

## 2021-07-04 DIAGNOSIS — M25571 Pain in right ankle and joints of right foot: Secondary | ICD-10-CM | POA: Insufficient documentation

## 2021-07-04 LAB — CBC WITH DIFFERENTIAL/PLATELET
Abs Immature Granulocytes: 0.04 10*3/uL (ref 0.00–0.07)
Basophils Absolute: 0 10*3/uL (ref 0.0–0.1)
Basophils Relative: 0 %
Eosinophils Absolute: 0.2 10*3/uL (ref 0.0–0.5)
Eosinophils Relative: 2 %
HCT: 44.6 % (ref 39.0–52.0)
Hemoglobin: 14.7 g/dL (ref 13.0–17.0)
Immature Granulocytes: 1 %
Lymphocytes Relative: 20 %
Lymphs Abs: 1.6 10*3/uL (ref 0.7–4.0)
MCH: 31.5 pg (ref 26.0–34.0)
MCHC: 33 g/dL (ref 30.0–36.0)
MCV: 95.7 fL (ref 80.0–100.0)
Monocytes Absolute: 0.7 10*3/uL (ref 0.1–1.0)
Monocytes Relative: 9 %
Neutro Abs: 5.7 10*3/uL (ref 1.7–7.7)
Neutrophils Relative %: 68 %
Platelets: 188 10*3/uL (ref 150–400)
RBC: 4.66 MIL/uL (ref 4.22–5.81)
RDW: 13 % (ref 11.5–15.5)
WBC: 8.3 10*3/uL (ref 4.0–10.5)
nRBC: 0 % (ref 0.0–0.2)

## 2021-07-04 LAB — COMPREHENSIVE METABOLIC PANEL
ALT: 25 U/L (ref 0–44)
AST: 26 U/L (ref 15–41)
Albumin: 4.5 g/dL (ref 3.5–5.0)
Alkaline Phosphatase: 74 U/L (ref 38–126)
Anion gap: 7 (ref 5–15)
BUN: 21 mg/dL (ref 8–23)
CO2: 24 mmol/L (ref 22–32)
Calcium: 9 mg/dL (ref 8.9–10.3)
Chloride: 106 mmol/L (ref 98–111)
Creatinine, Ser: 1.31 mg/dL — ABNORMAL HIGH (ref 0.61–1.24)
GFR, Estimated: 57 mL/min — ABNORMAL LOW (ref >60)
Glucose, Bld: 167 mg/dL — ABNORMAL HIGH (ref 70–99)
Potassium: 4 mmol/L (ref 3.5–5.1)
Sodium: 137 mmol/L (ref 135–145)
Total Bilirubin: 0.3 mg/dL (ref 0.3–1.2)
Total Protein: 7.7 g/dL (ref 6.5–8.1)

## 2021-07-04 LAB — URINALYSIS, ROUTINE W REFLEX MICROSCOPIC
Bacteria, UA: NONE SEEN
Bilirubin Urine: NEGATIVE
Glucose, UA: 50 mg/dL — AB
Hgb urine dipstick: NEGATIVE
Ketones, ur: NEGATIVE mg/dL
Nitrite: NEGATIVE
Protein, ur: NEGATIVE mg/dL
Specific Gravity, Urine: 1.019 (ref 1.005–1.030)
pH: 5 (ref 5.0–8.0)

## 2021-07-04 LAB — PROTIME-INR
INR: 2.9 — ABNORMAL HIGH (ref 0.8–1.2)
Prothrombin Time: 29.9 seconds — ABNORMAL HIGH (ref 11.4–15.2)

## 2021-07-04 LAB — LACTIC ACID, PLASMA
Lactic Acid, Venous: 1.2 mmol/L (ref 0.5–1.9)
Lactic Acid, Venous: 1.1 mmol/L (ref 0.5–1.9)

## 2021-07-04 LAB — TROPONIN I (HIGH SENSITIVITY)
Troponin I (High Sensitivity): 11 ng/L (ref <18)
Troponin I (High Sensitivity): 12 ng/L (ref <18)

## 2021-07-04 LAB — RESP PANEL BY RT-PCR (FLU A&B, COVID) ARPGX2
Influenza A by PCR: NEGATIVE
Influenza B by PCR: NEGATIVE
SARS Coronavirus 2 by RT PCR: NEGATIVE

## 2021-07-04 LAB — URIC ACID: Uric Acid, Serum: 6.3 mg/dL (ref 3.7–8.6)

## 2021-07-04 MED ORDER — IOHEXOL 350 MG/ML SOLN
75.0000 mL | Freq: Once | INTRAVENOUS | Status: AC | PRN
  Administered 2021-07-04: 75 mL via INTRAVENOUS

## 2021-07-04 NOTE — ED Provider Notes (Signed)
[facility] Provider Note  Patient Contact: 4:32 PM (approximate)   History   Joint Swelling   HPI  Dustin Estrada is a 76 y.o. male who presents the emergency department with shortness of breath, right ankle pain.  Patient states that he has a history of DVT in 2016, and is on warfarin for same.  He states that he has had a recent long trip that he was sitting down for prolonged period of time but the shortness of breath precipitated this trip.  It is worse since his trip and he is not requiring oxygen as his O2 sat on arrival was 82%.  Patient is now complaining of right ankle pain that only developed after his long trip.  Given his history of previous DVT and no ankle pain or shortness of breath he is concerned for same.  He denies any cardiac history, denies any chest pain.  No fevers or chills, nasal congestion, cough.  Patient states that his shortness of breath was was started to be evaluated by primary care and has been referred to cardiology for same.  Patient states that they have not arrived at a source of his shortness of breath.  Patient has not taken any new medications today but has taken his chronic daily medications.  History of diabetes, GERD, hyperlipidemia, DVT     Physical Exam   Triage Vital Signs: ED Triage Vitals  Enc Vitals Group     BP 07/04/21 1629 (!) 151/73     Pulse Rate 07/04/21 1629 (!) 58     Resp 07/04/21 1629 (!) 26     Temp --      Temp src --      SpO2 07/04/21 1629 (!) 82 %     Weight 07/04/21 1627 235 lb (106.6 kg)     Height 07/04/21 1627 '5\' 8"'$  (1.727 m)     Head Circumference --      Peak Flow --      Pain Score 07/04/21 1625 0     Pain Loc --      Pain Edu? --      Excl. in [place]? --     Most recent vital signs: Vitals:   07/04/21 2130 07/04/21 2147  BP: (!) 146/73   Pulse: (!) 52   Resp: 16   Temp:  97.7 F (36.5 C)  SpO2: 98%      General: Alert and in no acute distress. ENT:      Ears:       Nose:  No congestion/rhinnorhea.      Mouth/Throat: Mucous membranes are moist. Neck: No stridor. No cervical spine tenderness to palpation Hematological/Lymphatic/Immunilogical: No cervical lymphadenopathy. Cardiovascular:  Good peripheral perfusion.  No appreciable murmurs, rubs, gallops. Respiratory: Normal respiratory effort.  Patient does have tachypnea but no retractions. Lungs CTAB. Good air entry to the bases with no decreased or absent breath sounds. Gastrointestinal: Bowel sounds 4 quadrants. Soft and nontender to palpation. No guarding or rigidity. No palpable masses. No distention. No CVA tenderness. Musculoskeletal: Full range of motion to all extremities.  Visualization of the right lower extremity reveals minimal edema of the right ankle when compared with left.  There is no erythema, no warmth to palpation.  Patient has tenderness globally about the ankle joint without palpable abnormality.  Slight extension of tenderness into the right calf along the inferior aspect, more along the Achilles tendon.  There is no edema or erythema of the calf.  Dorsalis  pedis pulses sensation intact. Neurologic:  No gross focal neurologic deficits are appreciated.  Skin:   No rash noted Other:   ED Results / Procedures / Treatments   Labs (all labs ordered are listed, but only abnormal results are displayed) Labs Reviewed  COMPREHENSIVE METABOLIC PANEL - Abnormal; Notable for the following components:      Result Value   Glucose, Bld 167 (*)    Creatinine, Ser 1.31 (*)    GFR, Estimated 57 (*)    All other components within normal limits  PROTIME-INR - Abnormal; Notable for the following components:   Prothrombin Time 29.9 (*)    INR 2.9 (*)    All other components within normal limits  URINALYSIS, ROUTINE W REFLEX MICROSCOPIC - Abnormal; Notable for the following components:   Color, Urine YELLOW (*)    APPearance CLEAR (*)    Glucose, UA 50 (*)    Leukocytes,Ua TRACE (*)    All other  components within normal limits  RESP PANEL BY RT-PCR (FLU A&B, COVID) ARPGX2  CULTURE, BLOOD (ROUTINE X 2)  CULTURE, BLOOD (ROUTINE X 2)  CBC WITH DIFFERENTIAL/PLATELET  URIC ACID  LACTIC ACID, PLASMA  LACTIC ACID, PLASMA  TROPONIN I (HIGH SENSITIVITY)  TROPONIN I (HIGH SENSITIVITY)     EKG  ED ECG REPORT I, Charline Bills ,  personally viewed and interpreted this ECG.   Date: 07/04/2021  EKG Time: 1639 hrs.  Rate: 59 bpm  Rhythm: unchanged from previous tracings, sinus bradycardia  Axis: Normal axis  Intervals:none  ST&T Change: No gross ST elevation or depression noted  Normal sinus rhythm, no STEMI, compared to previous EKG from 06/03/2020 no ongoing PVCs.   RADIOLOGY  I personally viewed, evaluated, and interpreted these images as part of my medical decision making, as well as reviewing the written report by the radiologist.  ED Provider Interpretation: No evidence of fracture or acute osseous abnormality on x-[place].  Ultrasound of the lower extremity revealed no evidence of DVT.  PE chest revealed no evidence of pulmonary embolism  DG Chest 2 View  Result Date: 07/04/2021 CLINICAL DATA:  Shortness of breath. EXAM: CHEST - 2 VIEW COMPARISON:  January 23, 2015 FINDINGS: The heart size and mediastinal contours are within normal limits. Both lungs are clear. The visualized skeletal structures are unremarkable. IMPRESSION: No active cardiopulmonary disease. Electronically Signed   By: Dorise Bullion III M.D.   On: 07/04/2021 17:09   DG Ankle Complete Right  Result Date: 07/04/2021 CLINICAL DATA:  Joint swelling. Intermittent swelling of the right ankle with pain starting 3 weeks ago. EXAM: RIGHT ANKLE - COMPLETE 3+ VIEW COMPARISON:  None Available. FINDINGS: Irregularity of the distal bilateral malleoli is consistent with previous nonacute avulsion injuries. No acute fractures are identified. The ankle mortise is intact. No other acute abnormalities. IMPRESSION: No acute  abnormalities. Electronically Signed   By: Dorise Bullion III M.D.   On: 07/04/2021 17:10   CT Angio Chest PE W and/or Wo Contrast  Result Date: 07/04/2021 CLINICAL DATA:  Pulmonary embolism (PE) suspected, high prob Shortness of breath.  On anticoagulation.  History of DVT and PE. EXAM: CT ANGIOGRAPHY CHEST WITH CONTRAST TECHNIQUE: Multidetector CT imaging of the chest was performed using the standard protocol during bolus administration of intravenous contrast. Multiplanar CT image reconstructions and MIPs were obtained to evaluate the vascular anatomy. RADIATION DOSE REDUCTION: This exam was performed according to the departmental dose-optimization program which includes automated exposure control, adjustment of the mA and/or kV  according to patient size and/or use of iterative reconstruction technique. CONTRAST:  97m OMNIPAQUE IOHEXOL 350 MG/ML SOLN COMPARISON:  Radiograph earlier today.  Chest CTA 10/27/2013 FINDINGS: Cardiovascular: Resolution of prior pulmonary emboli. No residual thrombus or acute pulmonary embolus. Aortic atherosclerosis and tortuosity. No aortic aneurysm. Mild cardiomegaly. Trace pericardial fluid anteriorly. Mediastinum/Nodes: Small bilateral hilar lymph nodes, not enlarged by size criteria. No suspicious adenopathy. No esophageal wall thickening. No visualized thyroid nodule. Lungs/Pleura: Mild heterogeneous pulmonary parenchyma. No confluent consolidation. Linear subsegmental atelectasis within the lingula. No pleural fluid. No pulmonary mass. Upper Abdomen: Postsurgical change of the stomach. No acute upper abdominal findings. Musculoskeletal: Thoracic spondylosis. There are no acute or suspicious osseous abnormalities. Review of the MIP images confirms the above findings. IMPRESSION: 1. No acute pulmonary embolus. Resolution of prior pulmonary emboli. 2. Mild heterogeneous pulmonary parenchyma, can be seen with small airways disease. Aortic Atherosclerosis (ICD10-I70.0).  Electronically Signed   By: MKeith RakeM.D.   On: 07/04/2021 19:05   UKoreaVenous Img Lower Unilateral Right  Result Date: 07/04/2021 CLINICAL DATA:  Right lower extremity pain.  History of DVT. EXAM: Right LOWER EXTREMITY VENOUS DOPPLER ULTRASOUND TECHNIQUE: Gray-scale sonography with compression, as well as color and duplex ultrasound, were performed to evaluate the deep venous system(s) from the level of the common femoral vein through the popliteal and proximal calf veins. COMPARISON:  None Available. FINDINGS: VENOUS Normal compressibility of the common femoral, superficial femoral, and popliteal veins, as well as the visualized calf veins. Visualized portions of profunda femoral vein and great saphenous vein unremarkable. No filling defects to suggest DVT on grayscale or color Doppler imaging. Doppler waveforms show normal direction of venous flow, normal respiratory plasticity and response to augmentation. Limited views of the contralateral common femoral vein are unremarkable. OTHER None. Limitations: none IMPRESSION: Negative. Electronically Signed   By: AAnner CreteM.D.   On: 07/04/2021 19:40    PROCEDURES:  Critical Care performed: No  Procedures   MEDICATIONS ORDERED IN ED: Medications  iohexol (OMNIPAQUE) 350 MG/ML injection 75 mL (75 mLs Intravenous Contrast Given 07/04/21 1842)     IMPRESSION / MDM / ASSESSMENT AND PLAN / ED COURSE  I reviewed the triage vital signs and the nursing notes.                              Differential diagnosis includes, but is not limited to, DVT, gout, stress fracture, PE, pneumonia, STEMI, NSTEMI    Patient's diagnosis is consistent with ankle pain, shortness of breath.  Patient presented to the emergency department primarily concerned about ankle pain.  Patient has a history of DVT, is on warfarin for same.  Patient did have a long trip earlier today and developed ankle pain following.  On exam there is no gross edema or erythema of  the right lower extremity.  Was slightly tender globally around the ankle joint without palpable abnormality.  Patient also arrived with some shortness of breath with no chest pain.  He states that he has been undergoing work-ups from both primary care and cardiology for his shortness of breath especially dyspnea with exertion.  On arrival patient had an O2 saturation of 82% was placed on 3 L.  This immediately improved his oxygen saturation to 100%.  As items of his work-up were returning and were reassuring I took the patient off of his oxygen with no evidence of desaturation.  In addition we ambulated the patient  and he maintained oxygen saturation between 97 and 100% on room air.  EKG is reassuring, imaging is reassuring, labs are reassuring at this time.  I do not feel that patient requires admission though his arrival complaint was initially concerning that patient would possibly be admitted.  However again work-up is reassuring and I feel that patient is stable for discharge.  He should continue follow-up with cardiology and primary care for ongoing investigation into the patient's intermittent shortness of breath and dyspnea on exertion.  Concerning signs and symptoms are discussed with the patient.  Return precautions discussed with the patient.. Patient is given ED precautions to return to the ED for any worsening or new symptoms.        FINAL CLINICAL IMPRESSION(S) / ED DIAGNOSES   Final diagnoses:  Acute right ankle pain  Shortness of breath     Rx / DC Orders   ED Discharge Orders     None        Note:  This document was prepared using Dragon voice recognition software and may include unintentional dictation errors.   Brynda Peon 07/04/21 2221    Vanessa [place], MD 07/04/21 [phone]

## 2021-07-04 NOTE — ED Notes (Signed)
This Rn to bedside, introduced self to patient and wife. Pt noted to be in NAD, on 3L via [place] at this time. Pt alert and answering questions without difficulty. Repeat labs obtained by this RN. Pt states return of burning sensation to his L foot. CMS intact at this time. [place] to bedside at this time. Call bell within reach of patient at this time. Pt and wife denies further needs at this time. Pt noted by SB on the monitor.

## 2021-07-04 NOTE — ED Notes (Signed)
NAD noted at time of D/C. Pt denies questions or concerns. Pt ambulatory to the lobby at this time. Pt states that he prefers to walk at this time, visualized with steady gait.

## 2021-07-04 NOTE — ED Notes (Signed)
Pt ambulated in hall by this RN. Pt tolerated well. Ambulatory O2 sat 97-100% on RA. Pt taken off O2 by PA prior to ambulatory sat obtained by this RN and patient maintained 100% on RA prior to ambulating. PA Roderic Palau made aware.

## 2021-07-04 NOTE — ED Triage Notes (Signed)
Pt via POV from home. Pt c/o R ankle intermittent swelling and pain that started 3 weeks ago. States that he is on Warfarin for the hx of same, pt also endorses SOB. Pt has a hx of DVT and PE. Denies any chest pain. Pt is A&OX4, RA sat 84%, placed on 2L . Placed in room 12 at this time.

## 2021-07-09 LAB — CULTURE, BLOOD (ROUTINE X 2)
Culture: NO GROWTH
Culture: NO GROWTH

## 2021-07-29 ENCOUNTER — Ambulatory Visit: Payer: Medicare Other | Admitting: Dermatology

## 2021-09-26 LAB — COLOGUARD: COLOGUARD: POSITIVE — AB

## 2022-04-02 ENCOUNTER — Encounter: Payer: Self-pay | Admitting: *Deleted

## 2022-04-05 ENCOUNTER — Ambulatory Visit: Payer: Medicare Other | Admitting: Anesthesiology

## 2022-04-05 ENCOUNTER — Ambulatory Visit
Admission: RE | Admit: 2022-04-05 | Discharge: 2022-04-05 | Disposition: A | Discharge status: Home / Self Care | Payer: Medicare Other | Source: Ambulatory Visit | Attending: Gastroenterology | Admitting: Gastroenterology

## 2022-04-05 ENCOUNTER — Other Ambulatory Visit: Payer: Self-pay

## 2022-04-05 ENCOUNTER — Encounter
Admission: RE | Disposition: A | Discharge status: Home / Self Care | Payer: Self-pay | Source: Ambulatory Visit | Attending: Gastroenterology

## 2022-04-05 ENCOUNTER — Encounter: Payer: Self-pay | Admitting: Gastroenterology

## 2022-04-05 DIAGNOSIS — K5730 Diverticulosis of large intestine without perforation or abscess without bleeding: Secondary | ICD-10-CM | POA: Insufficient documentation

## 2022-04-05 DIAGNOSIS — Z9889 Other specified postprocedural states: Secondary | ICD-10-CM | POA: Insufficient documentation

## 2022-04-05 DIAGNOSIS — I480 Paroxysmal atrial fibrillation: Secondary | ICD-10-CM | POA: Diagnosis not present

## 2022-04-05 DIAGNOSIS — I10 Essential (primary) hypertension: Secondary | ICD-10-CM | POA: Insufficient documentation

## 2022-04-05 DIAGNOSIS — G4730 Sleep apnea, unspecified: Secondary | ICD-10-CM | POA: Insufficient documentation

## 2022-04-05 DIAGNOSIS — Z9884 Bariatric surgery status: Secondary | ICD-10-CM | POA: Insufficient documentation

## 2022-04-05 DIAGNOSIS — R195 Other fecal abnormalities: Secondary | ICD-10-CM | POA: Insufficient documentation

## 2022-04-05 DIAGNOSIS — D120 Benign neoplasm of cecum: Secondary | ICD-10-CM | POA: Insufficient documentation

## 2022-04-05 DIAGNOSIS — Z7901 Long term (current) use of anticoagulants: Secondary | ICD-10-CM | POA: Insufficient documentation

## 2022-04-05 DIAGNOSIS — D123 Benign neoplasm of transverse colon: Secondary | ICD-10-CM | POA: Diagnosis not present

## 2022-04-05 DIAGNOSIS — K640 First degree hemorrhoids: Secondary | ICD-10-CM | POA: Diagnosis not present

## 2022-04-05 DIAGNOSIS — E785 Hyperlipidemia, unspecified: Secondary | ICD-10-CM | POA: Insufficient documentation

## 2022-04-05 DIAGNOSIS — Z1211 Encounter for screening for malignant neoplasm of colon: Secondary | ICD-10-CM | POA: Diagnosis not present

## 2022-04-05 DIAGNOSIS — E119 Type 2 diabetes mellitus without complications: Secondary | ICD-10-CM | POA: Diagnosis not present

## 2022-04-05 HISTORY — DX: Cardiac arrhythmia, unspecified: I49.9

## 2022-04-05 HISTORY — PX: COLONOSCOPY: SHX5424

## 2022-04-05 HISTORY — DX: Paroxysmal atrial fibrillation: I48.0

## 2022-04-05 HISTORY — DX: Anxiety disorder, unspecified: F41.9

## 2022-04-05 LAB — GLUCOSE, CAPILLARY: Glucose-Capillary: 179 mg/dL — ABNORMAL HIGH (ref 70–99)

## 2022-04-05 LAB — PROTIME-INR
INR: 2.3 — ABNORMAL HIGH (ref 0.8–1.2)
Prothrombin Time: 24.8 seconds — ABNORMAL HIGH (ref 11.4–15.2)

## 2022-04-05 SURGERY — COLONOSCOPY
Anesthesia: General

## 2022-04-05 MED ORDER — PROPOFOL 500 MG/50ML IV EMUL
INTRAVENOUS | Status: DC | PRN
  Administered 2022-04-05: 175 ug/kg/min via INTRAVENOUS

## 2022-04-05 MED ORDER — LIDOCAINE HCL (CARDIAC) PF 100 MG/5ML IV SOSY
PREFILLED_SYRINGE | INTRAVENOUS | Status: DC | PRN
  Administered 2022-04-05: 50 mg via INTRAVENOUS

## 2022-04-05 MED ORDER — SODIUM CHLORIDE 0.9 % IV SOLN: INTRAVENOUS | Status: DC

## 2022-04-05 MED ORDER — PROPOFOL 10 MG/ML IV BOLUS
INTRAVENOUS | Status: DC | PRN
  Administered 2022-04-05: 70 mg via INTRAVENOUS

## 2022-04-05 NOTE — H&P (Signed)
Outpatient short stay form Pre-procedure 04/05/2022  Dustin Rubenstein, MD  Primary Physician: [name], MD  Reason for visit:  Cologuard positive  History of present illness:    77 y/o gentleman with history of obesity s/p duodenal switch, DM II, HLD, and a. Fib on coumadin with last dose 2 days ago. INR today is 2.3. Explained to patient that we would be unable to remove large polyps but patient elected to proceed with colonoscopy. Also with history of hiatal hernia repair. No first degree relatives with GI malignancies.    Current Facility-Administered Medications:    0.9 %  sodium chloride infusion, , Intravenous, Continuous, , Hilton Cork, MD  Medications Prior to Admission  Medication Sig Dispense Refill Last Dose   atorvastatin (LIPITOR) 20 MG tablet Take 20 mg by mouth every evening.   11 04/04/2022   insulin lispro (HUMALOG) 100 UNIT/ML KwikPen Inject 15 Units into the skin 2 (two) times daily before lunch and supper.   Past Week   insulin lispro protamine-lispro (HUMALOG 50/50 MIX) (50-50) 100 UNIT/ML SUSP injection Inject into the skin 2 (two) times daily before a meal.      losartan (COZAAR) 50 MG tablet Take 50 mg by mouth every evening.    04/04/2022   propranolol (INDERAL) 10 MG tablet Take 10 mg by mouth 2 (two) times daily.   04/05/2022   tamsulosin (FLOMAX) 0.4 MG CAPS capsule Take 0.4 mg by mouth.   04/05/2022   TOUJEO SOLOSTAR 300 UNIT/ML SOPN Inject 30 Units into the skin every evening.  1 Past Week   b complex vitamins tablet Take 1 tablet by mouth daily.      Calcium Carb-Cholecalciferol (CALCIUM 600+D3 PO) Take 1 tablet by mouth daily.      docusate sodium (COLACE) 100 MG capsule Take by mouth.      Multiple Vitamins-Minerals (MULTIVITAMIN PO) Take 2 tablets by mouth daily.      nystatin cream (MYCOSTATIN) Apply topically 2 (two) times daily.      warfarin (COUMADIN) 5 MG tablet Take 2.5-5 mg by mouth See admin instructions. Take 5 mg by mouth daily on  Wednesday. Take 2.5 mg by mouth daily on all other days.   04/03/2022     No Known Allergies   Past Medical History:  Diagnosis Date   Anxiety    Arthritis    Diabetes mellitus without complication ([place])    type 2   Dysrhythmia    GERD (gastroesophageal reflux disease)    Hx of blood clots 12/2014   lung   Hypertension    PAF (paroxysmal atrial fibrillation) ([facility])    Shortness of breath dyspnea    on exertion   Sleep apnea    cpap    Review of systems:  Otherwise negative.    Physical Exam  Gen: Alert, oriented. Appears stated age.  HEENT: PERRLA. Lungs: No respiratory distress CV: RRR Abd: soft, benign, no masses Ext: No edema    Planned procedures: Proceed with colonoscopy. The patient understands the nature of the planned procedure, indications, risks, alternatives and potential complications including but not limited to bleeding, infection, perforation, damage to internal organs and possible oversedation/side effects from anesthesia. The patient agrees and gives consent to proceed.  Please refer to procedure notes for findings, recommendations and patient disposition/instructions.     Dustin Rubenstein, MD [facility]

## 2022-04-05 NOTE — Transfer of Care (Signed)
Immediate Anesthesia Transfer of Care Note  Patient: Dustin Estrada  Procedure(s) Performed: COLONOSCOPY  Patient Location: Endoscopy Unit  Anesthesia Type:General  Level of Consciousness: sedated  Airway & Oxygen Therapy: Patient Spontanous Breathing  Post-op Assessment: Report given to RN and Post -op Vital signs reviewed and stable  Post vital signs: Reviewed and stable  Last Vitals:  Vitals Value Taken Time  BP 115/63 04/05/22 1254  Temp 37 C 04/05/22 1254  Pulse 65 04/05/22 1256  Resp 24 04/05/22 1256  SpO2 100 % 04/05/22 1256  Vitals shown include unvalidated device data.  Last Pain:  Vitals:   04/05/22 1254  TempSrc: Temporal  PainSc: Asleep         Complications: No notable events documented.

## 2022-04-05 NOTE — Anesthesia Procedure Notes (Signed)
Date/Time: 04/05/2022 12:15 PM  Performed by: Johnna Acosta, CRNAPre-anesthesia Checklist: Patient identified, Emergency Drugs available, Suction available, Patient being monitored and Timeout performed Patient Re-evaluated:Patient Re-evaluated prior to induction Oxygen Delivery Method: Nasal cannula Preoxygenation: Pre-oxygenation with 100% oxygen Induction Type: IV induction

## 2022-04-05 NOTE — Interval H&P Note (Signed)
History and Physical Interval Note:  04/05/2022 12:05 PM  Dustin Estrada  has presented today for surgery, with the diagnosis of Positive colorectal cancer screening using Cologuard test (R19.5).  The various methods of treatment have been discussed with the patient and family. After consideration of risks, benefits and other options for treatment, the patient has consented to  Procedure(s): COLONOSCOPY (N/A) as a surgical intervention.  The patient's history has been reviewed, patient examined, no change in status, stable for surgery.  I have reviewed the patient's chart and labs.  Questions were answered to the patient's satisfaction.     Lesly Rubenstein  Ok to proceed with colonoscopy

## 2022-04-05 NOTE — Anesthesia Postprocedure Evaluation (Signed)
Anesthesia Post Note  Patient: [name]  Procedure(s) Performed: COLONOSCOPY  Patient location during evaluation: PACU Anesthesia Type: General Level of consciousness: awake and alert, oriented and patient cooperative Pain management: pain level controlled Vital Signs Assessment: post-procedure vital signs reviewed and stable Respiratory status: spontaneous breathing, nonlabored ventilation and respiratory function stable Cardiovascular status: blood pressure returned to baseline and stable Postop Assessment: adequate PO intake Anesthetic complications: no   No notable events documented.   Last Vitals:  Vitals:   04/05/22 1304 04/05/22 1314  BP: 103/67 110/64  Pulse: (!) 58 60  Resp: 20 18  Temp:    SpO2: 100% 98%    Last Pain:  Vitals:   04/05/22 1314  TempSrc:   PainSc: 0-No pain                 Darrin Nipper

## 2022-04-05 NOTE — Anesthesia Preprocedure Evaluation (Addendum)
Anesthesia Evaluation  Patient identified by MRN, date of birth, ID band Patient awake    Reviewed: Allergy & Precautions, NPO status , Patient's Chart, lab work & pertinent test results  History of Anesthesia Complications Negative for: history of anesthetic complications  Airway Mallampati: III   Neck ROM: Full    Dental  (+) Partial Lower   Pulmonary sleep apnea    Pulmonary exam normal breath sounds clear to auscultation       Cardiovascular hypertension, Normal cardiovascular exam+ dysrhythmias (paroxysmal a fib on warfarin)  Rhythm:Regular Rate:Normal  PE 2016  ECG 07/04/21:  Sinus rhythm Nonspecific intraventricular conduction delay Borderline T abnormalities, inferior leads Baseline wander in lead(s) V3  Echo 05/13/21: NORMAL LEFT VENTRICULAR SYSTOLIC FUNCTION NORMAL RIGHT VENTRICULAR SYSTOLIC FUNCTION MILD VALVULAR REGURGITATION  NO VALVULAR STENOSIS Mildly elevated pulmonary pressures  Myocardial Perfusion 06/25/21: Normal marker perfusion scan no evidence of stress-induced myocardial ischemia ejection fraction 57% conclusion negative scan     Neuro/Psych  PSYCHIATRIC DISORDERS Anxiety        GI/Hepatic hiatal hernia,GERD  ,,S/p gastric bypass   Endo/Other  diabetes, Type 2  Obesity   Renal/GU negative Renal ROS     Musculoskeletal  (+) Arthritis ,    Abdominal   Peds  Hematology negative hematology ROS (+)   Anesthesia Other Findings Cardiology note 03/23/22:  Paroxysmal atrial fibrillation, continue Coumadin for anticoagulation and propranolol for rate management Bradycardia, stable, heart rate today is 63 bpm. Consider further treatment is symptoms worsen.  DVT by history, recommend continuing anticoagulation with Coumadin therapy HTN, reasonably controlled, recommend continuing losartan and propranolol Type 2 DM, recommend continuing Toujeo Humalog insulin, healthy diet and  exercise Hyperlipidemia, continue Lipitor therapy for lipid management Possible OSA, recommend sleep study, CPAP if indicated, and weight loss Pulmonary HTN, mild, recommend continue using inhalers, consider supplemental oxygen and following-up with pulmonary. Consider R/L heart cath in the future for pulmonary hypertension.  Obesity, recommend exercise, portion control, and weight loss SOB, dyspnea upon exertion current, is related to pulmonary hypertension and obesity, follow up with pulmonary Have the patient follow-up in 6 months    Reproductive/Obstetrics                             Anesthesia Physical Anesthesia Plan  ASA: 3  Anesthesia Plan: General   Post-op Pain Management:    Induction: Intravenous  PONV Risk Score and Plan: 2 and Propofol infusion, TIVA and Treatment may vary due to age or medical condition  Airway Management Planned: Natural Airway  Additional Equipment:   Intra-op Plan:   Post-operative Plan:   Informed Consent: I have reviewed the patients History and Physical, chart, labs and discussed the procedure including the risks, benefits and alternatives for the proposed anesthesia with the patient or authorized representative who has indicated his/her understanding and acceptance.       Plan Discussed with: CRNA  Anesthesia Plan Comments: (LMA/GETA backup discussed.  Patient consented for risks of anesthesia including but not limited to:  - adverse reactions to medications - damage to eyes, teeth, lips or other oral mucosa - nerve damage due to positioning  - sore throat or hoarseness - damage to heart, brain, nerves, lungs, other parts of body or loss of life  Informed patient about role of CRNA in peri- and intra-operative care.  Patient voiced understanding.)        Anesthesia Quick Evaluation

## 2022-04-05 NOTE — Op Note (Addendum)
[facility] Patient Name: Dustin Estrada Procedure Date: 04/05/2022 12:23 PM MRN: [id number] Account #: [id number] Date of Birth: 01-25-1946 Admit Type: Outpatient Age: 77 Room: [facility] ENDO ROOM 3 Gender: Male Note Status: Finalized Instrument Name: Park Meo [id number] Procedure:             Colonoscopy Indications:           Positive Cologuard test Providers:             Andrey Farmer MD, MD Medicines:             Monitored Anesthesia Care Complications:         No immediate complications. Estimated blood loss:                         Minimal. Procedure:             Pre-Anesthesia Assessment:                        - Prior to the procedure, a History and Physical was                         performed, and patient medications and allergies were                         reviewed. The patient is competent. The risks and                         benefits of the procedure and the sedation options and                         risks were discussed with the patient. All questions                         were answered and informed consent was obtained.                         Patient identification and proposed procedure were                         verified by the physician, the nurse, the                         anesthesiologist, the anesthetist and the technician                         in the endoscopy suite. Mental Status Examination:                         alert and oriented. Airway Examination: normal                         oropharyngeal airway and neck mobility. Respiratory                         Examination: clear to auscultation. CV Examination:                         normal. Prophylactic Antibiotics: The patient does not  require prophylactic antibiotics. Prior                         Anticoagulants: The patient has taken Coumadin                         (warfarin), last dose was 2 days prior to procedure.                          ASA Grade Assessment: III - A patient with severe                         systemic disease. After reviewing the risks and                         benefits, the patient was deemed in satisfactory                         condition to undergo the procedure. The anesthesia                         plan was to use monitored anesthesia care (MAC).                         Immediately prior to administration of medications,                         the patient was re-assessed for adequacy to receive                         sedatives. The heart rate, respiratory rate, oxygen                         saturations, blood pressure, adequacy of pulmonary                         ventilation, and response to care were monitored                         throughout the procedure. The physical status of the                         patient was re-assessed after the procedure.                        After obtaining informed consent, the colonoscope was                         passed under direct vision. Throughout the procedure,                         the patient's blood pressure, pulse, and oxygen                         saturations were monitored continuously. The                         Colonoscope was introduced through the anus and  advanced to the the cecum, identified by appendiceal                         orifice and ileocecal valve. The colonoscopy was                         performed without difficulty. The patient tolerated                         the procedure well. The quality of the bowel                         preparation was adequate to identify polyps. The                         ileocecal valve, appendiceal orifice, and rectum were                         photographed. Findings:      The perianal and digital rectal examinations were normal.      A 50 mm polypoid lesion was found in the cecum. The lesion was sessile.       No bleeding was present. This was biopsied  with a cold forceps for       histology. Estimated blood loss was minimal.      A few small-mouthed diverticula were found in the transverse colon,       hepatic flexure and ascending colon.      A 6 mm polyp was found in the proximal transverse colon. The polyp was       sessile. The polyp was removed with a cold snare. Resection and       retrieval were complete. Estimated blood loss was minimal.      Multiple small-mouthed diverticula were found in the sigmoid colon.      Internal hemorrhoids were found during retroflexion. The hemorrhoids       were Grade I (internal hemorrhoids that do not prolapse).      The exam was otherwise without abnormality on direct and retroflexion       views. Impression:            - Polypoid lesion in the cecum. Biopsied.                        - Diverticulosis in the transverse colon, at the                         hepatic flexure and in the ascending colon.                        - One 6 mm polyp in the proximal transverse colon,                         removed with a cold snare. Resected and retrieved.                        - Diverticulosis in the sigmoid colon.                        - Internal hemorrhoids.                        -  The examination was otherwise normal on direct and                         retroflexion views. Recommendation:        - Discharge patient to home.                        - Resume previous diet.                        - Resume Coumadin (warfarin) at prior dose today.                        - Await pathology results.                        - Repeat colonoscopy for surveillance based on                         pathology results.                        - Return to referring physician as previously                         scheduled. Procedure Code(s):     --- Professional ---                        [id number], Colonoscopy, flexible; with removal of                         tumor(s), polyp(s), or other lesion(s) by snare                          technique                        45380, 13, Colonoscopy, flexible; with biopsy, single                         or multiple Diagnosis Code(s):     --- Professional ---                        D49.0, Neoplasm of unspecified behavior of digestive                         system                        K64.0, First degree hemorrhoids                        D12.3, Benign neoplasm of transverse colon (hepatic                         flexure or splenic flexure)                        R19.5, Other fecal abnormalities                        K57.30, Diverticulosis of large intestine without  perforation or abscess without bleeding CPT copyright 2022 American Medical Association. All rights reserved. The codes documented in this report are preliminary and upon coder review may  be revised to meet current compliance requirements. Andrey Farmer MD, MD 04/05/2022 12:58:30 PM Number of Addenda: 0 Note Initiated On: 04/05/2022 12:23 PM Scope Withdrawal Time: 0 hours 15 minutes 46 seconds  Total Procedure Duration: 0 hours 22 minutes 12 seconds  Estimated Blood Loss:  Estimated blood loss was minimal.      [facility]

## 2022-04-06 LAB — SURGICAL PATHOLOGY

## 2022-04-07 ENCOUNTER — Other Ambulatory Visit: Payer: Self-pay | Admitting: Gastroenterology

## 2022-04-07 DIAGNOSIS — K6389 Other specified diseases of intestine: Secondary | ICD-10-CM

## 2022-04-27 ENCOUNTER — Ambulatory Visit
Admission: RE | Admit: 2022-04-27 | Discharge: 2022-04-27 | Disposition: A | Discharge status: Home / Self Care | Payer: Medicare Other | Source: Ambulatory Visit | Attending: Gastroenterology | Admitting: Gastroenterology

## 2022-04-27 DIAGNOSIS — K6389 Other specified diseases of intestine: Secondary | ICD-10-CM | POA: Diagnosis present

## 2022-04-27 MED ORDER — IOHEXOL 300 MG/ML  SOLN
100.0000 mL | Freq: Once | INTRAMUSCULAR | Status: AC | PRN
  Administered 2022-04-27: 100 mL via INTRAVENOUS

## 2022-05-12 ENCOUNTER — Other Ambulatory Visit: Payer: Self-pay

## 2022-05-12 DIAGNOSIS — R0602 Shortness of breath: Secondary | ICD-10-CM

## 2022-05-12 DIAGNOSIS — I2089 Other forms of angina pectoris: Secondary | ICD-10-CM

## 2022-05-12 DIAGNOSIS — I2510 Atherosclerotic heart disease of native coronary artery without angina pectoris: Secondary | ICD-10-CM

## 2022-06-04 ENCOUNTER — Telehealth (HOSPITAL_COMMUNITY): Payer: Self-pay | Admitting: Emergency Medicine

## 2022-06-04 NOTE — Telephone Encounter (Signed)
Reaching out to patient to offer assistance regarding upcoming cardiac imaging study; pt verbalizes understanding of appt date/time, parking situation and where to check in, pre-test NPO status and medications ordered, and verified current allergies; name and call back number provided for further questions should they arise   RN Navigator Cardiac Imaging [facility] [phone] office [phone] cell 

## 2022-06-07 ENCOUNTER — Ambulatory Visit
Admission: RE | Admit: 2022-06-07 | Discharge: 2022-06-07 | Disposition: A | Discharge status: Home / Self Care | Payer: Medicare Other | Source: Ambulatory Visit | Attending: Internal Medicine | Admitting: Internal Medicine

## 2022-06-07 DIAGNOSIS — I2510 Atherosclerotic heart disease of native coronary artery without angina pectoris: Secondary | ICD-10-CM | POA: Insufficient documentation

## 2022-06-07 DIAGNOSIS — I2089 Other forms of angina pectoris: Secondary | ICD-10-CM | POA: Diagnosis present

## 2022-06-07 DIAGNOSIS — R0602 Shortness of breath: Secondary | ICD-10-CM | POA: Insufficient documentation

## 2022-06-07 MED ORDER — IOHEXOL 350 MG/ML SOLN
100.0000 mL | Freq: Once | INTRAVENOUS | Status: AC | PRN
  Administered 2022-06-07: 100 mL via INTRAVENOUS

## 2022-06-07 MED ORDER — NITROGLYCERIN 0.4 MG SL SUBL
SUBLINGUAL_TABLET | SUBLINGUAL | Status: AC
  Filled 2022-06-07: qty 1

## 2022-06-07 MED ORDER — NITROGLYCERIN 0.4 MG SL SUBL
0.8000 mg | SUBLINGUAL_TABLET | Freq: Once | SUBLINGUAL | Status: AC
  Administered 2022-06-07: 0.8 mg via SUBLINGUAL

## 2022-06-07 NOTE — Progress Notes (Signed)
Pt completed procedure well with no issues. Pt informed to drink plenty of fluids throughout the day. Pt ABCs intact and no complaints. Pt ambulatory with steady gait.

## 2022-09-28 ENCOUNTER — Other Ambulatory Visit: Payer: Self-pay

## 2022-09-28 ENCOUNTER — Emergency Department: Payer: Medicare Other

## 2022-09-28 ENCOUNTER — Inpatient Hospital Stay
Admission: EM | Admit: 2022-09-28 | Discharge: 2022-09-30 | DRG: 393 | Disposition: A | Discharge status: Home / Self Care | Payer: Medicare Other | Attending: Internal Medicine | Admitting: Internal Medicine

## 2022-09-28 ENCOUNTER — Observation Stay: Payer: Medicare Other

## 2022-09-28 DIAGNOSIS — N179 Acute kidney failure, unspecified: Secondary | ICD-10-CM

## 2022-09-28 DIAGNOSIS — K9189 Other postprocedural complications and disorders of digestive system: Secondary | ICD-10-CM | POA: Diagnosis not present

## 2022-09-28 DIAGNOSIS — E119 Type 2 diabetes mellitus without complications: Secondary | ICD-10-CM

## 2022-09-28 DIAGNOSIS — Z8249 Family history of ischemic heart disease and other diseases of the circulatory system: Secondary | ICD-10-CM

## 2022-09-28 DIAGNOSIS — G9340 Encephalopathy, unspecified: Secondary | ICD-10-CM | POA: Diagnosis not present

## 2022-09-28 DIAGNOSIS — E1165 Type 2 diabetes mellitus with hyperglycemia: Secondary | ICD-10-CM

## 2022-09-28 DIAGNOSIS — D72829 Elevated white blood cell count, unspecified: Secondary | ICD-10-CM | POA: Diagnosis present

## 2022-09-28 DIAGNOSIS — R4182 Altered mental status, unspecified: Principal | ICD-10-CM

## 2022-09-28 DIAGNOSIS — G4730 Sleep apnea, unspecified: Secondary | ICD-10-CM | POA: Diagnosis present

## 2022-09-28 DIAGNOSIS — K219 Gastro-esophageal reflux disease without esophagitis: Secondary | ICD-10-CM | POA: Diagnosis present

## 2022-09-28 DIAGNOSIS — Z833 Family history of diabetes mellitus: Secondary | ICD-10-CM

## 2022-09-28 DIAGNOSIS — Z7901 Long term (current) use of anticoagulants: Secondary | ICD-10-CM

## 2022-09-28 DIAGNOSIS — Z6835 Body mass index (BMI) 35.0-35.9, adult: Secondary | ICD-10-CM

## 2022-09-28 DIAGNOSIS — I959 Hypotension, unspecified: Secondary | ICD-10-CM | POA: Diagnosis present

## 2022-09-28 DIAGNOSIS — Y838 Other surgical procedures as the cause of abnormal reaction of the patient, or of later complication, without mention of misadventure at the time of the procedure: Secondary | ICD-10-CM | POA: Diagnosis present

## 2022-09-28 DIAGNOSIS — N1831 Chronic kidney disease, stage 3a: Secondary | ICD-10-CM | POA: Diagnosis present

## 2022-09-28 DIAGNOSIS — I480 Paroxysmal atrial fibrillation: Secondary | ICD-10-CM | POA: Diagnosis present

## 2022-09-28 DIAGNOSIS — E669 Obesity, unspecified: Secondary | ICD-10-CM

## 2022-09-28 DIAGNOSIS — E1122 Type 2 diabetes mellitus with diabetic chronic kidney disease: Secondary | ICD-10-CM | POA: Diagnosis present

## 2022-09-28 DIAGNOSIS — I10 Essential (primary) hypertension: Secondary | ICD-10-CM | POA: Diagnosis not present

## 2022-09-28 DIAGNOSIS — G9341 Metabolic encephalopathy: Secondary | ICD-10-CM

## 2022-09-28 DIAGNOSIS — Z9049 Acquired absence of other specified parts of digestive tract: Secondary | ICD-10-CM

## 2022-09-28 DIAGNOSIS — E785 Hyperlipidemia, unspecified: Secondary | ICD-10-CM | POA: Diagnosis present

## 2022-09-28 DIAGNOSIS — Z86718 Personal history of other venous thrombosis and embolism: Secondary | ICD-10-CM

## 2022-09-28 DIAGNOSIS — Z794 Long term (current) use of insulin: Secondary | ICD-10-CM

## 2022-09-28 DIAGNOSIS — Z79899 Other long term (current) drug therapy: Secondary | ICD-10-CM

## 2022-09-28 DIAGNOSIS — I129 Hypertensive chronic kidney disease with stage 1 through stage 4 chronic kidney disease, or unspecified chronic kidney disease: Secondary | ICD-10-CM | POA: Diagnosis present

## 2022-09-28 DIAGNOSIS — Z86010 Personal history of colonic polyps: Secondary | ICD-10-CM

## 2022-09-28 DIAGNOSIS — Z9889 Other specified postprocedural states: Secondary | ICD-10-CM

## 2022-09-28 LAB — COMPREHENSIVE METABOLIC PANEL
ALT: 30 U/L (ref 0–44)
AST: 29 U/L (ref 15–41)
Albumin: 4.2 g/dL (ref 3.5–5.0)
Alkaline Phosphatase: 82 U/L (ref 38–126)
Anion gap: 11 (ref 5–15)
BUN: 19 mg/dL (ref 8–23)
CO2: 23 mmol/L (ref 22–32)
Calcium: 9.5 mg/dL (ref 8.9–10.3)
Chloride: 104 mmol/L (ref 98–111)
Creatinine, Ser: 1.37 mg/dL — ABNORMAL HIGH (ref 0.61–1.24)
GFR, Estimated: 54 mL/min — ABNORMAL LOW (ref >60)
Glucose, Bld: 198 mg/dL — ABNORMAL HIGH (ref 70–99)
Potassium: 4.7 mmol/L (ref 3.5–5.1)
Sodium: 138 mmol/L (ref 135–145)
Total Bilirubin: 1.2 mg/dL (ref 0.3–1.2)
Total Protein: 7.5 g/dL (ref 6.5–8.1)

## 2022-09-28 LAB — CBC
HCT: 44.2 % (ref 39.0–52.0)
Hemoglobin: 14.8 g/dL (ref 13.0–17.0)
MCH: 31.7 pg (ref 26.0–34.0)
MCHC: 33.5 g/dL (ref 30.0–36.0)
MCV: 94.6 fL (ref 80.0–100.0)
Platelets: 178 10*3/uL (ref 150–400)
RBC: 4.67 MIL/uL (ref 4.22–5.81)
RDW: 12.5 % (ref 11.5–15.5)
WBC: 14 10*3/uL — ABNORMAL HIGH (ref 4.0–10.5)
nRBC: 0 % (ref 0.0–0.2)

## 2022-09-28 LAB — CBG MONITORING, ED: Glucose-Capillary: 197 mg/dL — ABNORMAL HIGH (ref 70–99)

## 2022-09-28 LAB — LIPASE, BLOOD: Lipase: 36 U/L (ref 11–51)

## 2022-09-28 MED ORDER — HYDROMORPHONE HCL 1 MG/ML IJ SOLN: 0.5000 mg | INTRAMUSCULAR | Status: DC | PRN

## 2022-09-28 MED ORDER — IOHEXOL 300 MG/ML  SOLN
75.0000 mL | Freq: Once | INTRAMUSCULAR | Status: AC | PRN
  Administered 2022-09-28: 75 mL via INTRAVENOUS

## 2022-09-28 MED ORDER — ONDANSETRON HCL 4 MG PO TABS: 4.0000 mg | ORAL_TABLET | Freq: Four times a day (QID) | ORAL | Status: DC | PRN

## 2022-09-28 MED ORDER — METRONIDAZOLE 500 MG/100ML IV SOLN
500.0000 mg | Freq: Once | INTRAVENOUS | Status: AC
  Administered 2022-09-28: 500 mg via INTRAVENOUS
  Filled 2022-09-28: qty 100

## 2022-09-28 MED ORDER — LOSARTAN POTASSIUM 50 MG PO TABS: 50.0000 mg | ORAL_TABLET | Freq: Every evening | ORAL | Status: DC

## 2022-09-28 MED ORDER — ACETAMINOPHEN 325 MG PO TABS: 650.0000 mg | ORAL_TABLET | Freq: Four times a day (QID) | ORAL | Status: DC | PRN

## 2022-09-28 MED ORDER — SODIUM CHLORIDE 0.9 % IV SOLN
2.0000 g | Freq: Once | INTRAVENOUS | Status: AC
  Administered 2022-09-29: 2 g via INTRAVENOUS
  Filled 2022-09-28: qty 20

## 2022-09-28 MED ORDER — PROPRANOLOL HCL 10 MG PO TABS
10.0000 mg | ORAL_TABLET | Freq: Two times a day (BID) | ORAL | Status: DC
  Filled 2022-09-28: qty 1

## 2022-09-28 MED ORDER — LACTATED RINGERS IV BOLUS
1000.0000 mL | Freq: Once | INTRAVENOUS | Status: AC
  Administered 2022-09-28: 1000 mL via INTRAVENOUS

## 2022-09-28 MED ORDER — ATORVASTATIN CALCIUM 20 MG PO TABS
20.0000 mg | ORAL_TABLET | Freq: Every evening | ORAL | Status: DC
  Administered 2022-09-29 (×2): 20 mg via ORAL
  Filled 2022-09-28 (×2): qty 1

## 2022-09-28 MED ORDER — IOHEXOL 350 MG/ML SOLN
100.0000 mL | Freq: Once | INTRAVENOUS | Status: AC | PRN
  Administered 2022-09-28: 100 mL via INTRAVENOUS

## 2022-09-28 MED ORDER — INSULIN GLARGINE-YFGN 100 UNIT/ML ~~LOC~~ SOLN
20.0000 [IU] | Freq: Every day | SUBCUTANEOUS | Status: DC
  Administered 2022-09-29: 10 [IU] via SUBCUTANEOUS
  Administered 2022-09-29: 20 [IU] via SUBCUTANEOUS
  Filled 2022-09-28 (×2): qty 0.2

## 2022-09-28 MED ORDER — ONDANSETRON HCL 4 MG/2ML IJ SOLN: 4.0000 mg | Freq: Four times a day (QID) | INTRAMUSCULAR | Status: DC | PRN

## 2022-09-28 MED ORDER — SODIUM CHLORIDE 0.9% FLUSH
3.0000 mL | Freq: Once | INTRAVENOUS | Status: AC
  Administered 2022-09-28: 3 mL via INTRAVENOUS

## 2022-09-28 MED ORDER — SODIUM CHLORIDE 0.9% FLUSH
3.0000 mL | Freq: Two times a day (BID) | INTRAVENOUS | Status: DC
  Administered 2022-09-29 (×3): 3 mL via INTRAVENOUS

## 2022-09-28 MED ORDER — TAMSULOSIN HCL 0.4 MG PO CAPS
0.4000 mg | ORAL_CAPSULE | Freq: Every day | ORAL | Status: DC
  Filled 2022-09-28: qty 1

## 2022-09-28 MED ORDER — INSULIN ASPART 100 UNIT/ML IJ SOLN
0.0000 [IU] | Freq: Three times a day (TID) | INTRAMUSCULAR | Status: DC
  Administered 2022-09-29: 3 [IU] via SUBCUTANEOUS
  Administered 2022-09-29: 5 [IU] via SUBCUTANEOUS
  Administered 2022-09-29: 3 [IU] via SUBCUTANEOUS
  Filled 2022-09-28 (×3): qty 1

## 2022-09-28 MED ORDER — ACETAMINOPHEN 650 MG RE SUPP: 650.0000 mg | Freq: Four times a day (QID) | RECTAL | Status: DC | PRN

## 2022-09-28 MED ORDER — LACTATED RINGERS IV SOLN: INTRAVENOUS | Status: DC

## 2022-09-28 NOTE — ED Notes (Signed)
Pt during exam with neurology having great difficulty staying awake throughout exam

## 2022-09-28 NOTE — Progress Notes (Signed)
Code Stroke activated @ 1909.  To CT @ 1915 with return @ 1934.  Dr. Amada Jupiter on camera @ 1918 in CT.     Josiah Lobo BSN, Occupational hygienist

## 2022-09-28 NOTE — Consult Note (Signed)
[facility] Telemedicine Consult   Requesting Provider: Jackelyn Poling Consult Participants: Patient, wife, nursing Location of the provider: [facility] Location of the patient: [facility]  This consult was provided via telemedicine with 2-way video and audio communication. The patient/family was informed that care would be provided in this way and agreed to receive care in this manner.    Chief Complaint: Confusion  HPI: 77 year old male who underwent resection of a rather large (40 to 50 mm) polyp endoscopically earlier today.  Following the procedure, there may have been some mild confusion per the wife, but he significantly worsened this evening.  He actually came to the emergency department because of abdominal pain, and concern for his procedure.  Following arrival, however, his mental status declined and he became more confused.  There is some question of possible left grip strength weakness, and a code stroke was activated.  On my exam, there is no clearly focal finding, but he was clearly confused.  He was taken for a stat CT/CTA which were negative for any type of LVO.    LKW: Unclear, likely prior to procedure tpa given?: No, outside of window, recent GI procedure IR Thrombectomy? No, no LVO Time of teleneurologist evaluation: 718  Exam: Vitals:   09/28/22 1915 09/28/22 1939  BP: (!) 150/129 (!) 143/67  Pulse: (!) 102 (!) 106  Resp: 15 18  Temp:    SpO2: 98% 94%    General: 0  1A: Level of Consciousness - 1 1B: Ask Month and Age - 2 1C: 'Blink Eyes' & 'Squeeze Hands' - 0 2: Test Horizontal Extraocular Movements - 0 3: Test Visual Fields - 0 4: Test Facial Palsy - 0 5A: Test Left Arm Motor Drift - 0 5B: Test Right Arm Motor Drift - 0 6A: Test Left Leg Motor Drift - 0 6B: Test Right Leg Motor Drift - 0 7: Test Limb Ataxia - 0 8: Test Sensation - 0 9: Test Language/Aphasia- 0 10: Test Dysarthria - 0 11: Test Extinction/Inattention - 0 NIHSS  score: Three  He does make mistakes when describing the picture, but he does not appear aphasic, he is able to name objects and repeat without issue, just seems confused.  Imaging Reviewed: CT/CTA/CTP-negative  Labs reviewed in epic and pertinent values follow: Leukocytosis of 14   Assessment: 77 year old male presenting with abdominal pain with acute encephalopathy.  He has a nonfocal examination, and given the extent of his procedure, without definite evidence of stroke I feel that the risk/benefit of IV tenecteplase is not in favor of proceeding.  This is especially true given that he may have had some confusion earlier in the day, making the true time of onset significantly unclear.  My suspicion is that this likely represents multifactorial delirium in the setting of postanesthesia, GI procedure, coupled with some sundowning after arrival in the emergency department.  Certainly sepsis will need to be considered, and gram-negative infection as Upper GI procedures is very deliriogenic.  I do think he needs an MRI, but if this is negative I would not pursue further stroke workup, but would focus on controlling delirium and treating his underlying medical issues.  Recommendations:  1) MRI brain 2) stroke workup only if positive.  Ritta Slot, MD [facility] [phone]  If 7pm- 7am, please page neurology on call as listed in AMION.

## 2022-09-28 NOTE — Assessment & Plan Note (Signed)
Patient will restart Coumadin on Saturday.  Patient can go back on propranolol.

## 2022-09-28 NOTE — Assessment & Plan Note (Signed)
-   Resume home antihypertensives tomorrow 

## 2022-09-28 NOTE — Assessment & Plan Note (Addendum)
Restart Rocephin and Flagyl.  Follow-up blood cultures.  Patient is still having some abdominal soreness.

## 2022-09-28 NOTE — ED Triage Notes (Signed)
Pt sts that he went to [facility] this AM to have a colonoscopy. Pt sts that they removed a large polop and since than he has been having Nausea and Vomiting with chills. Pt sts that his fever at home and been 101.0

## 2022-09-28 NOTE — ED Notes (Signed)
Neurology on with pt. Wife of pt present in room

## 2022-09-28 NOTE — H&P (Addendum)
History and Physical    Patient: Dustin Estrada QQV:[id number] DOB: 1946/01/16 DOA: 09/28/2022 DOS: the patient was seen and examined on 09/28/2022 PCP: Gracelyn Nurse, MD  Patient coming from: Home  Chief Complaint:  Chief Complaint  Patient presents with   Abdominal Pain   HPI: Dustin Estrada is a 77 y.o. male with medical history significant of paroxysmal A-fib on warfarin, type 2 diabetes, hypertension, hyperlipidemia, who presents to the ED due to altered mental status and abdominal pain.  [name] states he had a colonoscopy today, once he arrived home, he was experiencing nausea with nonbilious, nonbloody vomiting.  In addition, he developed a fever up to 102.  He does not recall becoming confused.  His wife at bedside states that he gradually became more and more disoriented as the day went on.  That was why she brought him to the ER in addition to his severe abdominal pain.  [name] does not recall having severe abdominal pain, but states he is having some tenderness in the right lower region.  He denies any nausea at this time and states abdominal pain has improved.  ED course: On arrival to the ED, patient was hypertensive at 150/125 with heart rate of 70.  He was saturating at 90% on room air.  He was afebrile at 98.8. Initial workup notable for WBC of 14.0, glucose of 198, creatinine 1.37 with GFR 54.  CT of the head and CTA of the head/neck were obtained that did not show any acute infarct, and no evidence of LVO.  CT of the abdomen was obtained that demonstrated surgical clip in the cecum at the site of polypectomy with adjacent mild pericolonic inflammatory changes but no evidence of perforation or abscess.  Due to continued altered mental status, [facility] contacted for admission.  Review of Systems: As mentioned in the history of present illness. All other systems reviewed and are negative.  Past Medical History:  Diagnosis Date   Anxiety    Arthritis    Diabetes mellitus  without complication (HCC)    type 2   Dysrhythmia    GERD (gastroesophageal reflux disease)    Hx of blood clots 12/2014   lung   Hypertension    PAF (paroxysmal atrial fibrillation) (HCC)    Shortness of breath dyspnea    on exertion   Sleep apnea    cpap   Past Surgical History:  Procedure Laterality Date   CARDIAC CATHETERIZATION     CHOLECYSTECTOMY     COLONOSCOPY N/A 04/05/2022   Procedure: COLONOSCOPY;  Surgeon: Regis Bill, MD;  Location: [facility];  Service: Endoscopy;  Laterality: N/A;   COLONOSCOPY WITH PROPOFOL N/A 07/28/2015   Procedure: COLONOSCOPY WITH PROPOFOL;  Surgeon: Midge Minium, MD;  Location: [facility];  Service: Endoscopy;  Laterality: N/A;  Insulin dependent diabetic and CPAP   CYSTOSCOPY  06/03/2020   Procedure: CYSTOSCOPY FLEXIBLE;  Surgeon: Riki Altes, MD;  Location: [facility];  Service: Urology;;   ESOPHAGOGASTRODUODENOSCOPY (EGD) WITH PROPOFOL N/A 07/23/2015   Procedure: ESOPHAGOGASTRODUODENOSCOPY (EGD) WITH PROPOFOL;  Surgeon: Midge Minium, MD;  Location: [facility];  Service: Endoscopy;  Laterality: N/A;   KNEE SURGERY Left    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 SWITCH)     MASS EXCISION N/A 02/27/2018   Procedure: EXCISION MASS LEFT LEG;  Surgeon: Carolan Shiver, MD;  Location: [facility];  Service: General;  Laterality: N/A;   PENILE PROSTHESIS IMPLANT     new implant put  in 01/19/2021 no implant card available   POLYPECTOMY  07/28/2015   Procedure: POLYPECTOMY;  Surgeon: Midge Minium, MD;  Location: [facility];  Service: Endoscopy;;   REMOVAL OF PENILE PROSTHESIS N/A 06/03/2020   Procedure: REMOVAL OF PENILE PROSTHESIS;  Surgeon: Riki Altes, MD;  Location: [facility];  Service: Urology;  Laterality: N/A;   ROTATOR CUFF REPAIR Right    Social History:  reports that he has never smoked. He has never used smokeless tobacco. He reports that he does not drink alcohol and does  not use drugs.  No Known Allergies  Family History  Problem Relation Age of Onset   Diabetes Father    Heart attack Maternal Uncle    Heart attack Maternal Grandfather     Prior to Admission medications   Medication Sig Start Date End Date Taking? Authorizing Provider  atorvastatin (LIPITOR) 20 MG tablet Take 20 mg by mouth every evening.  09/20/16   [provider]  b complex vitamins tablet Take 1 tablet by mouth daily.    [provider]  Calcium Carb-Cholecalciferol (CALCIUM 600+D3 PO) Take 1 tablet by mouth daily.    [provider]  docusate sodium (COLACE) 100 MG capsule Take by mouth.    [provider]  insulin lispro (HUMALOG) 100 UNIT/ML KwikPen Inject 15 Units into the skin 2 (two) times daily before lunch and supper. 08/28/19   [provider]  insulin lispro protamine-lispro (HUMALOG 50/50 MIX) (50-50) 100 UNIT/ML SUSP injection Inject into the skin 2 (two) times daily before a meal.    [provider]  losartan (COZAAR) 50 MG tablet Take 50 mg by mouth every evening.     [provider]  Multiple Vitamins-Minerals (MULTIVITAMIN PO) Take 2 tablets by mouth daily.    [provider]  nystatin cream (MYCOSTATIN) Apply topically 2 (two) times daily. 06/12/20   [provider]  propranolol (INDERAL) 10 MG tablet Take 10 mg by mouth 2 (two) times daily.    [provider]  tamsulosin (FLOMAX) 0.4 MG CAPS capsule Take 0.4 mg by mouth.    [provider]  TOUJEO SOLOSTAR 300 UNIT/ML SOPN Inject 30 Units into the skin every evening.    [provider]  warfarin (COUMADIN) 5 MG tablet Take 2.5-5 mg by mouth See admin instructions. Take 5 mg by mouth daily on Wednesday. Take 2.5 mg by mouth daily on all other days.    [provider]    Physical Exam: Vitals:   09/28/22 1946 09/28/22 2000 09/28/22 2200 09/28/22 2227  BP: (!) 165/70 (!) 169/77 (!) 99/57   Pulse:  (!) 116  (!) 123   Resp:  (!) 21 12   Temp:    98.4 F (36.9 C)  TempSrc:    Oral  SpO2:  95% 93%   Weight:      Height:       Physical Exam Vitals and nursing note reviewed.  Constitutional:      General: He is not in acute distress.    Appearance: He is obese.  HENT:     Head: Normocephalic and atraumatic.  Cardiovascular:     Rate and Rhythm: Regular rhythm. Tachycardia present.  Pulmonary:     Effort: Pulmonary effort is normal. No respiratory distress.     Breath sounds: Normal breath sounds.  Abdominal:     General: Bowel sounds are normal. There is no distension.     Palpations: Abdomen is soft.  Tenderness: There is generalized abdominal tenderness. There is no guarding.     Hernia: No hernia is present.  Genitourinary:    Testes:        Right: Tenderness present.  Skin:    General: Skin is warm and dry.  Neurological:     Mental Status: He is alert.     Comments:  Patient is alert and oriented to person, place, situation but cannot recall all the events that occurred today 5 out of 5 strength throughout No facial asymmetry or dysarthria  Psychiatric:        Mood and Affect: Mood normal.        Behavior: Behavior normal.    Data Reviewed: CBC with WBC of 14.0, hemoglobin 14.8, platelets of 178 CMP with sodium of 138, potassium 4.7, bicarb 23, glucose 198, BUN 19, creat 1.37, AST 29, ALT 30 and GFR 54  CT ABDOMEN PELVIS W CONTRAST  Result Date: 09/28/2022 CLINICAL DATA:  Colonoscopy, status post large polyp removal, now with abdominal pain EXAM: CT ABDOMEN AND PELVIS WITH CONTRAST TECHNIQUE: Multidetector CT imaging of the abdomen and pelvis was performed using the standard protocol following bolus administration of intravenous contrast. RADIATION DOSE REDUCTION: This exam was performed according to the departmental dose-optimization program which includes automated exposure control, adjustment of the mA and/or kV according to patient size and/or use of iterative  reconstruction technique. CONTRAST:  75mL OMNIPAQUE IOHEXOL 300 MG/ML  SOLN COMPARISON:  04/27/2022 FINDINGS: Lower chest: Lung bases are clear. Hepatobiliary: Liver is within normal limits. Status post cholecystectomy. No intrahepatic or extrahepatic dilatation. Pancreas: Within normal limits. Spleen: Within normal limits. Adrenals/Urinary Tract: Adrenal glands are within normal limits. Kidneys within normal limits.  No hydronephrosis. Bladder is within normal limits. 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 No evidence of bowel obstruction. Normal appendix (series 2/image 84). Surgical clip in the cecum at the site of polypectomy. Adjacent mild pericolonic inflammatory changes (series 2/image 83) without drainable fluid collection/abscess or free air to suggest macroscopic perforation. Left colonic diverticulosis, without evidence of diverticulitis. Vascular/Lymphatic: No evidence of abdominal aortic aneurysm. No suspicious abdominopelvic lymphadenopathy. Reproductive: Prostatomegaly, with enlargement of the central gland indenting the base of the bladder, suggesting BPH. Other: No abdominopelvic ascites. Musculoskeletal: Degenerative changes of the visualized thoracolumbar spine. Grade 1 spondylolisthesis of L5 on S1. IMPRESSION: Surgical clip in the cecum at the site of polypectomy. Adjacent mild pericolonic inflammatory changes, postprocedural. No drainable fluid collection/abscess or free air to suggest macroscopic perforation. Left colonic diverticulosis, without evidence of diverticulitis. Additional ancillary findings as above. Electronically Signed   By: Charline Bills M.D.   On: 09/28/2022 20:24   CT HEAD CODE STROKE WO CONTRAST  Result Date: 09/28/2022 CLINICAL DATA:  Nausea, vomiting, chills. Dysarthria and left-sided weakness. Code stroke. EXAM: CT ANGIOGRAPHY HEAD AND NECK CT PERFUSION BRAIN TECHNIQUE: Multidetector CT imaging of the head and  neck was performed using the standard protocol during bolus administration of intravenous contrast. Multiplanar CT image reconstructions and MIPs were obtained to evaluate the vascular anatomy. Carotid stenosis measurements (when applicable) are obtained utilizing NASCET criteria, using the distal internal carotid diameter as the denominator. Multiphase CT imaging of the brain was performed following IV bolus contrast injection. Subsequent parametric perfusion maps were calculated using RAPID software. RADIATION DOSE REDUCTION: This exam was performed according to the departmental dose-optimization program which includes automated exposure control, adjustment of the mA and/or kV according to patient size and/or use of iterative reconstruction technique. CONTRAST:  OMNIPAQUE IOHEXOL 350 MG/ML SOLN COMPARISON:  None Available. FINDINGS: CT HEAD FINDINGS Brain: There is no acute intracranial hemorrhage, extra-axial fluid collection, or acute territorial infarct. ASPECTS is 10. Background parenchymal volume is normal for age. The ventricles are normal in size. Gray-white differentiation is preserved. There has an age-indeterminate but remote appearing infarct in the left frontal periventricular white matter. Additional patchy hypodensity in the supratentorial white matter likely reflects sequela of underlying chronic small-vessel ischemic change. The pituitary and suprasellar region are normal. There is no mass lesion. There is no mass effect or midline shift. Vascular: See below. Skull: There is no calvarial fracture. A lucent lesion in the midline occipital calvarium is favored to reflect a benign vascular lesion. Sinuses/Orbits: The paranasal sinuses are clear. Bilateral lens implants are in place. The globes and orbits are otherwise unremarkable. Other: The mastoid air cells and middle ear cavities are clear. Review of the MIP images confirms the above findings CTA NECK FINDINGS Aortic arch: There is a minimal  calcified plaque in the imaged aortic arch. The origins of the major branch vessels are patent. The subclavian arteries are patent to the level imaged. Right carotid system: The right common, internal, and external carotid arteries are patent, without hemodynamically significant stenosis or occlusion. There is no evidence of dissection or aneurysm. Left carotid system: The left common, internal, and external carotid arteries are patent, with minimal plaque at the bifurcation but no hemodynamically significant stenosis or occlusion there is no evidence of dissection or aneurysm. Vertebral arteries: The vertebral arteries are patent, without hemodynamically significant stenosis or occlusion there is no evidence of dissection or aneurysm. Skeleton: There is no acute osseous abnormality or suspicious osseous lesion. There is no visible canal hematoma. Other neck: The soft tissues of the neck are unremarkable. Upper chest: The imaged lung apices are clear. Review of the MIP images confirms the above findings CTA HEAD FINDINGS Anterior circulation: There is mild calcified plaque in the intracranial ICAs without significant stenosis or occlusion. The bilateral MCAs are patent. There is short-segment high-grade stenosis of a small right M2 branch in the sylvian fissure (7-95, 6-91, 10-20). The bilateral ACAS are patent, without proximal stenosis or occlusion. The anterior communicating artery is not definitely seen. There is no aneurysm or AVM. Posterior circulation: The bilateral V4 segments are patent. The basilar artery is patent. The major cerebellar arteries appear patent. The bilateral PCAs are patent, with mild multifocal narrowing of the left P1 and P2 segments, and focal moderate stenosis of the right P2 segment (7-98). Posterior communicating arteries are not identified. There is no aneurysm or AVM. Venous sinuses: Patent. Anatomic variants: None. Review of the MIP images confirms the above findings CT Brain  Perfusion Findings: ASPECTS: 10 CBF (<30%) Volume: 0mL Perfusion (Tmax>6.0s) volume: 0mL Mismatch Volume: 0mL Infarction Location:n/a IMPRESSION: 1. No acute intracranial hemorrhage or large vessel territorial infarct. 2. 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 3.  No emergent vascular finding. 4. Negative CT perfusion. 5. Short segment high-grade stenosis of the small right M2 branch in the sylvian fissure. 6. Atherosclerotic irregularity of both PCAs with up to moderate stenosis of the right P2 segment. 7. No hemodynamically significant stenosis or occlusion in the neck. These results were called by telephone at the time of interpretation on 09/28/2022 at 7:30 pm to provider EVAN BRADLER ; [facility] , who verbally acknowledged these results. Electronically Signed   By: Lesia Hausen M.D.   On:  09/28/2022 19:46   CT ANGIO HEAD NECK W WO CM W PERF (CODE STROKE)  Result Date: 09/28/2022 CLINICAL DATA:  Nausea, vomiting, chills. Dysarthria and left-sided weakness. Code stroke. EXAM: CT ANGIOGRAPHY HEAD AND NECK CT PERFUSION BRAIN TECHNIQUE: Multidetector CT imaging of the head and neck was performed using the standard protocol during bolus administration of intravenous contrast. Multiplanar CT image reconstructions and MIPs were obtained to evaluate the vascular anatomy. Carotid stenosis measurements (when applicable) are obtained utilizing NASCET criteria, using the distal internal carotid diameter as the denominator. Multiphase CT imaging of the brain was performed following IV bolus contrast injection. Subsequent parametric perfusion maps were calculated using RAPID software. RADIATION DOSE REDUCTION: This exam was performed according to the departmental dose-optimization program which includes automated exposure control, adjustment of the mA and/or kV according to patient size and/or use of iterative reconstruction technique. CONTRAST:   OMNIPAQUE IOHEXOL 350 MG/ML SOLN COMPARISON:  None Available. FINDINGS: CT HEAD FINDINGS Brain: There is no acute intracranial hemorrhage, extra-axial fluid collection, or acute territorial infarct. ASPECTS is 10. Background parenchymal volume is normal for age. The ventricles are normal in size. Gray-white differentiation is preserved. There has an age-indeterminate but remote appearing infarct in the left frontal periventricular white matter. Additional patchy hypodensity in the supratentorial white matter likely reflects sequela of underlying chronic small-vessel ischemic change. The pituitary and suprasellar region are normal. There is no mass lesion. There is no mass effect or midline shift. Vascular: See below. Skull: There is no calvarial fracture. A lucent lesion in the midline occipital calvarium is favored to reflect a benign vascular lesion. Sinuses/Orbits: The paranasal sinuses are clear. Bilateral lens implants are in place. The globes and orbits are otherwise unremarkable. Other: The mastoid air cells and middle ear cavities are clear. Review of the MIP images confirms the above findings CTA NECK FINDINGS Aortic arch: There is a minimal calcified plaque in the imaged aortic arch. The origins of the major branch vessels are patent. The subclavian arteries are patent to the level imaged. Right carotid system: The right common, internal, and external carotid arteries are patent, without hemodynamically significant stenosis or occlusion. There is no evidence of dissection or aneurysm. Left carotid system: The left common, internal, and external carotid arteries are patent, with minimal plaque at the bifurcation but no hemodynamically significant stenosis or occlusion there is no evidence of dissection or aneurysm. Vertebral arteries: The vertebral arteries are patent, without hemodynamically significant stenosis or occlusion there is no evidence of dissection or aneurysm. Skeleton: There is no acute  osseous abnormality or suspicious osseous lesion. There is no visible canal hematoma. Other neck: The soft tissues of the neck are unremarkable. Upper chest: The imaged lung apices are clear. Review of the MIP images confirms the above findings CTA HEAD FINDINGS Anterior circulation: There is mild calcified plaque in the intracranial ICAs without significant stenosis or occlusion. The bilateral MCAs are patent. There is short-segment high-grade stenosis of a small right M2 branch in the sylvian fissure (7-95, 6-91, 10-20). The bilateral ACAS are patent, without proximal stenosis or occlusion. The anterior communicating artery is not definitely seen. There is no aneurysm or AVM. Posterior circulation: The bilateral V4 segments are patent. The basilar artery is patent. The major cerebellar arteries appear patent. The bilateral PCAs are patent, with mild multifocal narrowing of the left P1 and P2 segments, and focal moderate stenosis of the right P2 segment (7-98). Posterior communicating arteries are not identified. There is no aneurysm or AVM.  Venous sinuses: Patent. Anatomic variants: None. Review of the MIP images confirms the above findings CT Brain Perfusion Findings: ASPECTS: 10 CBF (<30%) Volume: 0mL Perfusion (Tmax>6.0s) volume: 0mL Mismatch Volume: 0mL Infarction Location:n/a IMPRESSION: 1. No acute intracranial hemorrhage or large vessel territorial infarct. 2. 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 3.  No emergent vascular finding. 4. Negative CT perfusion. 5. Short segment high-grade stenosis of the small right M2 branch in the sylvian fissure. 6. Atherosclerotic irregularity of both PCAs with up to moderate stenosis of the right P2 segment. 7. No hemodynamically significant stenosis or occlusion in the neck. These results were called by telephone at the time of interpretation on 09/28/2022 at 7:30 pm to provider EVAN BRADLER ; [place]  [facility] , who verbally acknowledged these results. Electronically Signed   By: Lesia Hausen M.D.   On: 09/28/2022 19:46    Results are pending, will review when available.  Assessment and Plan:  * Acute encephalopathy Patient is presenting with altered mental status after a colonoscopy with polypectomy earlier today.  Initially presented as a code stroke with CT and CTA demonstrating no evidence of acute intracranial abnormality.  Given fluctuating nature of symptoms, primary differential at this time is postprocedural delirium. 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   - Neurology consulted; appreciate their recommendations - Delirium precautions - Avoid centrally acting medications  Postpolypectomy electrocoagulation syndrome Patient presented with severe abdominal pain several hours after undergoing a large polypectomy with use of electrocoagulation.  CT of the abdomen shows pericolonic inflammatory changes.  Given tachycardia, elevated WBC, abdominal pain pain and CT findings, consistent with postpolypectomy coagulation syndrome  - Telemetry monitoring - Clear liquid diet only. Advance tomorrow if clinically improving - Continue Rocephin and metronidazole - IV fluids  PAF (paroxysmal atrial fibrillation) (HCC) - Per GI recommendations postdischarge after colonoscopy, resume Coumadin in 3 days  Essential hypertension - Resume home antihypertensives tomorrow  Type 2 diabetes mellitus (HCC) - Hold home antiglycemic agents - SSI, sensitive while on clear liquid diet - Semglee 20 units at bedtime  Advance Care Planning:   Code Status: Full Code verified by patient  Consults: Neurology  Family Communication: Patient's wife and daughter updated at bedside  Severity of Illness: The appropriate patient status for this patient is OBSERVATION. Observation status is judged to be reasonable and necessary in order to provide the required intensity of service to ensure the  patient's safety. The patient's presenting symptoms, physical exam findings, and initial radiographic and laboratory data in the context of their medical condition is felt to place them at decreased risk for further clinical deterioration. Furthermore, it is anticipated that the patient will be medically stable for discharge from the hospital within 2 midnights of admission.   Author: Verdene Lennert, MD 09/28/2022 10:42 PM  For on call review [email or web address].

## 2022-09-28 NOTE — ED Provider Notes (Signed)
[facility] Provider Note   Event Date/Time   First MD Initiated Contact with Patient 09/28/22 1739     (approximate) History  Abdominal Pain  HPI Dustin Estrada is a 77 y.o. male with a recent past medical history of colonoscopy earlier today with large (5 cm) polyp removal as well as history of atrial fibrillation and currently not on anticoagulation who presents with his wife complaining of altered mental status and abdominal pain.  Wife states that patient has been somewhat altered since the procedure today and found patient to be febrile to 101 at home.  Patient is still altered and has nonsensical speech.  Therefore further review of systems is unable to be obtained ROS: Unable to be obtained   Physical Exam  Triage Vital Signs: ED Triage Vitals [09/28/22 1710]  Encounter Vitals Group     BP (!) 171/80     Systolic BP Percentile      Diastolic BP Percentile      Pulse Rate 73     Resp 17     Temp 99 F (37.2 C)     Temp Source Oral     SpO2 100 %     Weight 234 lb (106.1 kg)     Height 5\' 8"  (1.727 m)     Head Circumference      Peak Flow      Pain Score 4     Pain Loc      Pain Education      Exclude from Growth Chart    Most recent vital signs: Vitals:   09/28/22 2227 09/28/22 2230  BP:  (!) 114/56  Pulse:  (!) 119  Resp:  13  Temp: 98.4 F (36.9 C)   SpO2:  92%   General: Awake, operative.  Confused CV:  Good peripheral perfusion.  Resp:  Normal effort.  Abd:  No distention.  Other:  Elderly obese Caucasian male resting comfortably in no acute distress ED Results / Procedures / Treatments  Labs (all labs ordered are listed, but only abnormal results are displayed) Labs Reviewed  COMPREHENSIVE METABOLIC PANEL - Abnormal; Notable for the following components:      Result Value   Glucose, Bld 198 (*)    Creatinine, Ser 1.37 (*)    GFR, Estimated 54 (*)    All other components within normal limits  CBC - Abnormal; Notable for  the following components:   WBC 14.0 (*)    All other components within normal limits  CBG MONITORING, ED - Abnormal; Notable for the following components:   Glucose-Capillary 197 (*)    All other components within normal limits  CULTURE, BLOOD (ROUTINE X 2)  CULTURE, BLOOD (ROUTINE X 2)  LIPASE, BLOOD  CBC  BASIC METABOLIC PANEL  HEMOGLOBIN A1C   RADIOLOGY ED MD interpretation: CT of the abdomen pelvis with IV contrast shows surgical clip in the cecum at the site of polypectomy with adjacent mild pericolonic inflammatory changes that are postprocedural.  There is no drainable fluid collection/abscess or free air to suggest macroscopic perforation.  Left colonic diverticulosis without diverticulitis -Agree with radiology assessment Official radiology report(s): CT ABDOMEN PELVIS W CONTRAST  Result Date: 09/28/2022 CLINICAL DATA:  Colonoscopy, status post large polyp removal, now with abdominal pain EXAM: CT ABDOMEN AND PELVIS WITH CONTRAST TECHNIQUE: Multidetector CT imaging of the abdomen and pelvis was performed using the standard protocol following bolus administration of intravenous contrast. RADIATION DOSE REDUCTION: This exam was performed according  to the departmental dose-optimization program which includes automated exposure control, adjustment of the mA and/or kV according to patient size and/or use of iterative reconstruction technique. CONTRAST:  75mL OMNIPAQUE IOHEXOL 300 MG/ML  SOLN COMPARISON:  04/27/2022 FINDINGS: Lower chest: Lung bases are clear. Hepatobiliary: Liver is within normal limits. Status post cholecystectomy. No intrahepatic or extrahepatic dilatation. Pancreas: Within normal limits. Spleen: Within normal limits. Adrenals/Urinary Tract: Adrenal glands are within normal limits. Kidneys within normal limits.  No hydronephrosis. Bladder is within normal limits. 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 No evidence of  bowel obstruction. Normal appendix (series 2/image 84). Surgical clip in the cecum at the site of polypectomy. Adjacent mild pericolonic inflammatory changes (series 2/image 83) without drainable fluid collection/abscess or free air to suggest macroscopic perforation. Left colonic diverticulosis, without evidence of diverticulitis. Vascular/Lymphatic: No evidence of abdominal aortic aneurysm. No suspicious abdominopelvic lymphadenopathy. Reproductive: Prostatomegaly, with enlargement of the central gland indenting the base of the bladder, suggesting BPH. Other: No abdominopelvic ascites. Musculoskeletal: Degenerative changes of the visualized thoracolumbar spine. Grade 1 spondylolisthesis of L5 on S1. IMPRESSION: Surgical clip in the cecum at the site of polypectomy. Adjacent mild pericolonic inflammatory changes, postprocedural. No drainable fluid collection/abscess or free air to suggest macroscopic perforation. Left colonic diverticulosis, without evidence of diverticulitis. Additional ancillary findings as above. Electronically Signed   By: Charline Bills M.D.   On: 09/28/2022 20:24   CT HEAD CODE STROKE WO CONTRAST  Result Date: 09/28/2022 CLINICAL DATA:  Nausea, vomiting, chills. Dysarthria and left-sided weakness. Code stroke. EXAM: CT ANGIOGRAPHY HEAD AND NECK CT PERFUSION BRAIN TECHNIQUE: Multidetector CT imaging of the head and neck was performed using the standard protocol during bolus administration of intravenous contrast. Multiplanar CT image reconstructions and MIPs were obtained to evaluate the vascular anatomy. Carotid stenosis measurements (when applicable) are obtained utilizing NASCET criteria, using the distal internal carotid diameter as the denominator. Multiphase CT imaging of the brain was performed following IV bolus contrast injection. Subsequent parametric perfusion maps were calculated using RAPID software. RADIATION DOSE REDUCTION: This exam was performed according to the  departmental dose-optimization program which includes automated exposure control, adjustment of the mA and/or kV according to patient size and/or use of iterative reconstruction technique. CONTRAST:  OMNIPAQUE IOHEXOL 350 MG/ML SOLN COMPARISON:  None Available. FINDINGS: CT HEAD FINDINGS Brain: There is no acute intracranial hemorrhage, extra-axial fluid collection, or acute territorial infarct. ASPECTS is 10. Background parenchymal volume is normal for age. The ventricles are normal in size. Gray-white differentiation is preserved. There has an age-indeterminate but remote appearing infarct in the left frontal periventricular white matter. Additional patchy hypodensity in the supratentorial white matter likely reflects sequela of underlying chronic small-vessel ischemic change. The pituitary and suprasellar region are normal. There is no mass lesion. There is no mass effect or midline shift. Vascular: See below. Skull: There is no calvarial fracture. A lucent lesion in the midline occipital calvarium is favored to reflect a benign vascular lesion. Sinuses/Orbits: The paranasal sinuses are clear. Bilateral lens implants are in place. The globes and orbits are otherwise unremarkable. Other: The mastoid air cells and middle ear cavities are clear. Review of the MIP images confirms the above findings CTA NECK FINDINGS Aortic arch: There is a minimal calcified plaque in the imaged aortic arch. The origins of the major branch vessels are patent. The subclavian arteries are patent to the level imaged. Right carotid system: The right common, internal, and external carotid arteries are patent,  without hemodynamically significant stenosis or occlusion. There is no evidence of dissection or aneurysm. Left carotid system: The left common, internal, and external carotid arteries are patent, with minimal plaque at the bifurcation but no hemodynamically significant stenosis or occlusion there is no evidence of dissection or  aneurysm. Vertebral arteries: The vertebral arteries are patent, without hemodynamically significant stenosis or occlusion there is no evidence of dissection or aneurysm. Skeleton: There is no acute osseous abnormality or suspicious osseous lesion. There is no visible canal hematoma. Other neck: The soft tissues of the neck are unremarkable. Upper chest: The imaged lung apices are clear. Review of the MIP images confirms the above findings CTA HEAD FINDINGS Anterior circulation: There is mild calcified plaque in the intracranial ICAs without significant stenosis or occlusion. The bilateral MCAs are patent. There is short-segment high-grade stenosis of a small right M2 branch in the sylvian fissure (7-95, 6-91, 10-20). The bilateral ACAS are patent, without proximal stenosis or occlusion. The anterior communicating artery is not definitely seen. There is no aneurysm or AVM. Posterior circulation: The bilateral V4 segments are patent. The basilar artery is patent. The major cerebellar arteries appear patent. The bilateral PCAs are patent, with mild multifocal narrowing of the left P1 and P2 segments, and focal moderate stenosis of the right P2 segment (7-98). Posterior communicating arteries are not identified. There is no aneurysm or AVM. Venous sinuses: Patent. Anatomic variants: None. Review of the MIP images confirms the above findings CT Brain Perfusion Findings: ASPECTS: 10 CBF (<30%) Volume: 0mL Perfusion (Tmax>6.0s) volume: 0mL Mismatch Volume: 0mL Infarction Location:n/a IMPRESSION: 1. No acute intracranial hemorrhage or large vessel territorial infarct. 2. 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 3.  No emergent vascular finding. 4. Negative CT perfusion. 5. Short segment high-grade stenosis of the small right M2 branch in the sylvian fissure. 6. Atherosclerotic irregularity of both PCAs with up to moderate stenosis of the right P2 segment. 7. No  hemodynamically significant stenosis or occlusion in the neck. These results were called by telephone at the time of interpretation on 09/28/2022 at 7:30 pm to provider   ; [facility] , who verbally acknowledged these results. Electronically Signed   By: Lesia Hausen M.D.   On: 09/28/2022 19:46   CT ANGIO HEAD NECK W WO CM W PERF (CODE STROKE)  Result Date: 09/28/2022 CLINICAL DATA:  Nausea, vomiting, chills. Dysarthria and left-sided weakness. Code stroke. EXAM: CT ANGIOGRAPHY HEAD AND NECK CT PERFUSION BRAIN TECHNIQUE: Multidetector CT imaging of the head and neck was performed using the standard protocol during bolus administration of intravenous contrast. Multiplanar CT image reconstructions and MIPs were obtained to evaluate the vascular anatomy. Carotid stenosis measurements (when applicable) are obtained utilizing NASCET criteria, using the distal internal carotid diameter as the denominator. Multiphase CT imaging of the brain was performed following IV bolus contrast injection. Subsequent parametric perfusion maps were calculated using RAPID software. RADIATION DOSE REDUCTION: This exam was performed according to the departmental dose-optimization program which includes automated exposure control, adjustment of the mA and/or kV according to patient size and/or use of iterative reconstruction technique. CONTRAST:  OMNIPAQUE IOHEXOL 350 MG/ML SOLN COMPARISON:  None Available. FINDINGS: CT HEAD FINDINGS Brain: There is no acute intracranial hemorrhage, extra-axial fluid collection, or acute territorial infarct. ASPECTS is 10. Background parenchymal volume is normal for age. The ventricles are normal in size. Gray-white differentiation is preserved. There has an age-indeterminate but remote appearing infarct in the left frontal  periventricular white matter. Additional patchy hypodensity in the supratentorial white matter likely reflects sequela of underlying chronic small-vessel  ischemic change. The pituitary and suprasellar region are normal. There is no mass lesion. There is no mass effect or midline shift. Vascular: See below. Skull: There is no calvarial fracture. A lucent lesion in the midline occipital calvarium is favored to reflect a benign vascular lesion. Sinuses/Orbits: The paranasal sinuses are clear. Bilateral lens implants are in place. The globes and orbits are otherwise unremarkable. Other: The mastoid air cells and middle ear cavities are clear. Review of the MIP images confirms the above findings CTA NECK FINDINGS Aortic arch: There is a minimal calcified plaque in the imaged aortic arch. The origins of the major branch vessels are patent. The subclavian arteries are patent to the level imaged. Right carotid system: The right common, internal, and external carotid arteries are patent, without hemodynamically significant stenosis or occlusion. There is no evidence of dissection or aneurysm. Left carotid system: The left common, internal, and external carotid arteries are patent, with minimal plaque at the bifurcation but no hemodynamically significant stenosis or occlusion there is no evidence of dissection or aneurysm. Vertebral arteries: The vertebral arteries are patent, without hemodynamically significant stenosis or occlusion there is no evidence of dissection or aneurysm. Skeleton: There is no acute osseous abnormality or suspicious osseous lesion. There is no visible canal hematoma. Other neck: The soft tissues of the neck are unremarkable. Upper chest: The imaged lung apices are clear. Review of the MIP images confirms the above findings CTA HEAD FINDINGS Anterior circulation: There is mild calcified plaque in the intracranial ICAs without significant stenosis or occlusion. The bilateral MCAs are patent. There is short-segment high-grade stenosis of a small right M2 branch in the sylvian fissure (7-95, 6-91, 10-20). The bilateral ACAS are patent, without proximal  stenosis or occlusion. The anterior communicating artery is not definitely seen. There is no aneurysm or AVM. Posterior circulation: The bilateral V4 segments are patent. The basilar artery is patent. The major cerebellar arteries appear patent. The bilateral PCAs are patent, with mild multifocal narrowing of the left P1 and P2 segments, and focal moderate stenosis of the right P2 segment (7-98). Posterior communicating arteries are not identified. There is no aneurysm or AVM. Venous sinuses: Patent. Anatomic variants: None. Review of the MIP images confirms the above findings CT Brain Perfusion Findings: ASPECTS: 10 CBF (<30%) Volume: 0mL Perfusion (Tmax>6.0s) volume: 0mL Mismatch Volume: 0mL Infarction Location:n/a IMPRESSION: 1. No acute intracranial hemorrhage or large vessel territorial infarct. 2. 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 3.  No emergent vascular finding. 4. Negative CT perfusion. 5. Short segment high-grade stenosis of the small right M2 branch in the sylvian fissure. 6. Atherosclerotic irregularity of both PCAs with up to moderate stenosis of the right P2 segment. 7. No hemodynamically significant stenosis or occlusion in the neck. These results were called by telephone at the time of interpretation on 09/28/2022 at 7:30 pm to provider   ; [facility] , who verbally acknowledged these results. Electronically Signed   By: Lesia Hausen M.D.   On: 09/28/2022 19:46   PROCEDURES: Critical Care performed: Yes, see critical care procedure note(s) .1-3 Lead EKG Interpretation  Performed by: Merwyn Katos, MD Authorized by: Merwyn Katos, MD     Interpretation: abnormal     ECG rate:  111   ECG rate assessment: tachycardic     Rhythm: sinus tachycardia  Ectopy: none     Conduction: normal   CRITICAL CARE Performed by: Merwyn Katos  Total critical care time: 33 minutes  Critical care time was  exclusive of separately billable procedures and treating other patients.  Critical care was necessary to treat or prevent imminent or life-threatening deterioration.  Critical care was time spent personally by me on the following activities: development of treatment plan with patient and/or surrogate as well as nursing, discussions with consultants, evaluation of patient's response to treatment, examination of patient, obtaining history from patient or surrogate, ordering and performing treatments and interventions, ordering and review of laboratory studies, ordering and review of radiographic studies, pulse oximetry and re-evaluation of patient's condition.  MEDICATIONS ORDERED IN ED: Medications  lactated ringers infusion ( Intravenous New Bag/Given 09/28/22 2153)  cefTRIAXone (ROCEPHIN) 2 g in sodium chloride 0.9 % 100 mL IVPB (has no administration in time range)  lactated ringers bolus 1,000 mL (has no administration in time range)  sodium chloride flush (NS) 0.9 % injection 3 mL (has no administration in time range)  acetaminophen (TYLENOL) tablet 650 mg (has no administration in time range)    Or  acetaminophen (TYLENOL) suppository 650 mg (has no administration in time range)  ondansetron (ZOFRAN) tablet 4 mg (has no administration in time range)    Or  ondansetron (ZOFRAN) injection 4 mg (has no administration in time range)  HYDROmorphone (DILAUDID) injection 0.5-1 mg (has no administration in time range)  atorvastatin (LIPITOR) tablet 20 mg (has no administration in time range)  insulin aspart (novoLOG) injection 0-9 Units (has no administration in time range)  insulin glargine-yfgn (SEMGLEE) injection 20 Units (has no administration in time range)  tamsulosin (FLOMAX) capsule 0.4 mg (has no administration in time range)  propranolol (INDERAL) tablet 10 mg (has no administration in time range)  losartan (COZAAR) tablet 50 mg (has no administration in time range)  sodium chloride  flush (NS) 0.9 % injection 3 mL (3 mLs Intravenous Given 09/28/22 1925)  iohexol (OMNIPAQUE) 350 MG/ML injection 100 mL (100 mLs Intravenous Contrast Given 09/28/22 1924)  iohexol (OMNIPAQUE) 300 MG/ML solution 75 mL (75 mLs Intravenous Contrast Given 09/28/22 2010)  metroNIDAZOLE (FLAGYL) IVPB 500 mg (500 mg Intravenous New Bag/Given 09/28/22 2154)   IMPRESSION / MDM / ASSESSMENT AND PLAN / ED COURSE  I reviewed the triage vital signs and the nursing notes.                             The patient is on the cardiac monitor to evaluate for evidence of arrhythmia and/or significant heart rate changes. Patient's presentation is most consistent with acute presentation with potential threat to life or bodily function.  This patient presents to the ED for concern of fever, altered mental status, abdominal pain after colonoscopy, this involves an extensive number of treatment options, and is a complaint that carries with it a high risk of complications and morbidity.  The differential diagnosis includes perforated bowel, sepsis, diverticulitis delirium Co morbidities that complicate the patient evaluation  Colonoscopy this morning Additional history obtained:  Additional history obtained from wife at bedside  External records from outside source obtained and reviewed including procedure note from today Lab Tests:  I Ordered, and personally interpreted labs.  The pertinent results include: White blood cell count 14, creatinine 1.37 Imaging Studies ordered:  I ordered imaging studies including CT abdomen and pelvis  I independently visualized and interpreted imaging which showed  inflammation around the postsurgical site with no drainable fluid collection and no signs of perforation  I agree with the radiologist interpretation Cardiac Monitoring: / EKG:  The patient was maintained on a cardiac monitor.  I personally viewed and interpreted the cardiac monitored which showed an underlying rhythm of:  Normal sinus rhythm  Throughout his emergency department course, nursing staff was called by patient's family with concerns of altered mental status.  Patient was evaluated by another provider and was found to meet stroke criteria and therefore stroke alert was activated.  I spoke to Dr. Amada Jupiter in neurology after CT noncontrast as well as CTA of the head and does not feel that he is a candidate for tPA at this time.  I agree with his assessment and discussed the need for MRI as an inpatient. I spoke to patient's wife about these findings who mentioned that the clip that was placed today during the procedure may not be amenable to MRI.  In evaluation of the information regarding this clip, it is "MRI dependent" and is compatible with 1.5-3 Tesla MRIs.  Consultations Obtained:  I requested consultation with the Dr. Amada Jupiter (neurology) and Dr. Servando Snare (gastroenterology),  and discussed lab and imaging findings as well as pertinent plan - they recommend: Admission with IV antibiotics and inpatient MRI Problem List / ED Course / Critical interventions / Medication management  Fever, altered mental status, leukocytosis  I ordered medication including empiric antibiotics for fever and leukocytosis  Reevaluation of the patient after these medicines showed that the patient stayed the same  I have reviewed the patients home medicines and have made adjustments as needed Dispo: Admit to medicine   FINAL CLINICAL IMPRESSION(S) / ED DIAGNOSES   Final diagnoses:  Altered mental status, unspecified altered mental status type   Rx / DC Orders   ED Discharge Orders     None      Note:  This document was prepared using Dragon voice recognition software and may include unintentional dictation errors.   Merwyn Katos, MD 09/28/22 2258

## 2022-09-28 NOTE — Assessment & Plan Note (Deleted)
Patient is presenting with altered mental status after a colonoscopy with polypectomy earlier today.  Initially presented as a code stroke with CT and CTA demonstrating no evidence of acute intracranial abnormality.  Given fluctuating nature of symptoms, primary differential at this time is postprocedural delirium. 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   - Neurology consulted; appreciate their recommendations - Delirium precautions - Avoid centrally acting medications

## 2022-09-28 NOTE — Assessment & Plan Note (Addendum)
Patient tolerated advanced diet.  Can go back on his usual regimen at home.

## 2022-09-28 NOTE — ED Notes (Signed)
Dr. Vicente Males notified of pt status change, Dr. Modesto Charon arrived to evaluate pt due to unavailability. This nurse noted new onset confusion, left sided weakness, and slurred speech. Code stroke called after evaluation.

## 2022-09-29 ENCOUNTER — Encounter: Payer: Self-pay | Admitting: Internal Medicine

## 2022-09-29 DIAGNOSIS — D72829 Elevated white blood cell count, unspecified: Secondary | ICD-10-CM | POA: Diagnosis present

## 2022-09-29 DIAGNOSIS — I959 Hypotension, unspecified: Secondary | ICD-10-CM | POA: Diagnosis present

## 2022-09-29 DIAGNOSIS — Z794 Long term (current) use of insulin: Secondary | ICD-10-CM | POA: Diagnosis not present

## 2022-09-29 DIAGNOSIS — I9589 Other hypotension: Secondary | ICD-10-CM | POA: Diagnosis not present

## 2022-09-29 DIAGNOSIS — K9189 Other postprocedural complications and disorders of digestive system: Secondary | ICD-10-CM | POA: Diagnosis present

## 2022-09-29 DIAGNOSIS — Z7901 Long term (current) use of anticoagulants: Secondary | ICD-10-CM | POA: Diagnosis not present

## 2022-09-29 DIAGNOSIS — Z86010 Personal history of colonic polyps: Secondary | ICD-10-CM | POA: Diagnosis not present

## 2022-09-29 DIAGNOSIS — G9341 Metabolic encephalopathy: Secondary | ICD-10-CM

## 2022-09-29 DIAGNOSIS — G9340 Encephalopathy, unspecified: Secondary | ICD-10-CM | POA: Diagnosis present

## 2022-09-29 DIAGNOSIS — Z6835 Body mass index (BMI) 35.0-35.9, adult: Secondary | ICD-10-CM | POA: Diagnosis not present

## 2022-09-29 DIAGNOSIS — Z833 Family history of diabetes mellitus: Secondary | ICD-10-CM | POA: Diagnosis not present

## 2022-09-29 DIAGNOSIS — Z79899 Other long term (current) drug therapy: Secondary | ICD-10-CM | POA: Diagnosis not present

## 2022-09-29 DIAGNOSIS — K219 Gastro-esophageal reflux disease without esophagitis: Secondary | ICD-10-CM | POA: Diagnosis present

## 2022-09-29 DIAGNOSIS — E669 Obesity, unspecified: Secondary | ICD-10-CM | POA: Insufficient documentation

## 2022-09-29 DIAGNOSIS — E1165 Type 2 diabetes mellitus with hyperglycemia: Secondary | ICD-10-CM

## 2022-09-29 DIAGNOSIS — E1122 Type 2 diabetes mellitus with diabetic chronic kidney disease: Secondary | ICD-10-CM | POA: Diagnosis present

## 2022-09-29 DIAGNOSIS — G4730 Sleep apnea, unspecified: Secondary | ICD-10-CM | POA: Diagnosis present

## 2022-09-29 DIAGNOSIS — Z86718 Personal history of other venous thrombosis and embolism: Secondary | ICD-10-CM | POA: Diagnosis not present

## 2022-09-29 DIAGNOSIS — I480 Paroxysmal atrial fibrillation: Secondary | ICD-10-CM | POA: Diagnosis present

## 2022-09-29 DIAGNOSIS — E785 Hyperlipidemia, unspecified: Secondary | ICD-10-CM | POA: Diagnosis present

## 2022-09-29 DIAGNOSIS — N1831 Chronic kidney disease, stage 3a: Secondary | ICD-10-CM | POA: Diagnosis present

## 2022-09-29 DIAGNOSIS — Z9049 Acquired absence of other specified parts of digestive tract: Secondary | ICD-10-CM | POA: Diagnosis not present

## 2022-09-29 DIAGNOSIS — R4182 Altered mental status, unspecified: Secondary | ICD-10-CM | POA: Diagnosis not present

## 2022-09-29 DIAGNOSIS — Z8249 Family history of ischemic heart disease and other diseases of the circulatory system: Secondary | ICD-10-CM | POA: Diagnosis not present

## 2022-09-29 DIAGNOSIS — Y838 Other surgical procedures as the cause of abnormal reaction of the patient, or of later complication, without mention of misadventure at the time of the procedure: Secondary | ICD-10-CM | POA: Diagnosis present

## 2022-09-29 DIAGNOSIS — N179 Acute kidney failure, unspecified: Secondary | ICD-10-CM | POA: Diagnosis present

## 2022-09-29 DIAGNOSIS — I129 Hypertensive chronic kidney disease with stage 1 through stage 4 chronic kidney disease, or unspecified chronic kidney disease: Secondary | ICD-10-CM | POA: Diagnosis present

## 2022-09-29 DIAGNOSIS — Z9889 Other specified postprocedural states: Secondary | ICD-10-CM | POA: Diagnosis not present

## 2022-09-29 LAB — GLUCOSE, CAPILLARY
Glucose-Capillary: 243 mg/dL — ABNORMAL HIGH (ref 70–99)
Glucose-Capillary: 297 mg/dL — ABNORMAL HIGH (ref 70–99)
Glucose-Capillary: 216 mg/dL — ABNORMAL HIGH (ref 70–99)
Glucose-Capillary: 185 mg/dL — ABNORMAL HIGH (ref 70–99)

## 2022-09-29 MED ORDER — SODIUM CHLORIDE 0.9 % IV SOLN
2.0000 g | INTRAVENOUS | Status: DC
  Administered 2022-09-29: 2 g via INTRAVENOUS
  Filled 2022-09-29: qty 20

## 2022-09-29 MED ORDER — METRONIDAZOLE 500 MG/100ML IV SOLN
500.0000 mg | Freq: Two times a day (BID) | INTRAVENOUS | Status: DC
  Administered 2022-09-29 (×2): 500 mg via INTRAVENOUS
  Filled 2022-09-29 (×3): qty 100

## 2022-09-29 MED ORDER — INSULIN ASPART 100 UNIT/ML IJ SOLN
5.0000 [IU] | Freq: Three times a day (TID) | INTRAMUSCULAR | Status: DC
  Administered 2022-09-29: 5 [IU] via SUBCUTANEOUS
  Filled 2022-09-29: qty 1

## 2022-09-29 MED ORDER — LACTATED RINGERS IV SOLN: INTRAVENOUS | Status: DC

## 2022-09-29 NOTE — Progress Notes (Signed)
   09/28/22 2350  Assess: MEWS Score  Temp 99.8 F (37.7 C)  BP 134/68  MAP (mmHg) 87  Pulse Rate (!) 111  Resp 18  SpO2 97 %  Assess: MEWS Score  MEWS Temp 0  MEWS Systolic 0  MEWS Pulse 2  MEWS RR 0  MEWS LOC 0  MEWS Score 2  MEWS Score Color Yellow  Assess: if the MEWS score is Yellow or Red  Were vital signs accurate and taken at a resting state? Yes  Does the patient meet 2 or more of the SIRS criteria? No  MEWS guidelines implemented  Yes, yellow  Treat  MEWS Interventions Considered administering scheduled or prn medications/treatments as ordered  Take Vital Signs  Increase Vital Sign Frequency  Yellow: Q2hr x1, continue Q4hrs until patient remains green for 12hrs  Escalate  MEWS: Escalate Yellow: Discuss with charge nurse and consider notifying provider and/or RRT  Notify: Charge Nurse/RN  Name of Charge Nurse/RN Notified Elveria Rising  Assess: SIRS CRITERIA  SIRS Temperature  0  SIRS Pulse 1  SIRS Respirations  0  SIRS WBC 0  SIRS Score Sum  1

## 2022-09-29 NOTE — Assessment & Plan Note (Signed)
Improved with IV fluids.  Can go back on propranolol and Flomax.  Hold Cozaar.  Check blood pressure and follow-up appointment.

## 2022-09-29 NOTE — Plan of Care (Signed)
Pt alert and oriented, VSS, wife at bedside. Pt denies c/o pain, just pressure to rt side of abd. Pt is receiving IV fluids and antibiotics IVPB for elevated WBC. Pt agrees with POC, pt ambulates independently to bathroom.  Problem: Education: Goal: Ability to describe self-care measures that may prevent or decrease complications (Diabetes Survival Skills Education) will improve Outcome: Not Progressing Goal: Individualized Educational Video(s) Outcome: Not Progressing   Problem: Coping: Goal: Ability to adjust to condition or change in health will improve Outcome: Not Progressing   Problem: Fluid Volume: Goal: Ability to maintain a balanced intake and output will improve Outcome: Not Progressing   Problem: Health Behavior/Discharge Planning: Goal: Ability to identify and utilize available resources and services will improve Outcome: Not Progressing Goal: Ability to manage health-related needs will improve Outcome: Not Progressing   Problem: Metabolic: Goal: Ability to maintain appropriate glucose levels will improve Outcome: Not Progressing   Problem: Nutritional: Goal: Maintenance of adequate nutrition will improve Outcome: Not Progressing Goal: Progress toward achieving an optimal weight will improve Outcome: Not Progressing   Problem: Skin Integrity: Goal: Risk for impaired skin integrity will decrease Outcome: Not Progressing   Problem: Tissue Perfusion: Goal: Adequacy of tissue perfusion will improve Outcome: Not Progressing   Problem: Education: Goal: Knowledge of General Education information will improve Description: Including pain rating scale, medication(s)/side effects and non-pharmacologic comfort measures Outcome: Not Progressing   Problem: Health Behavior/Discharge Planning: Goal: Ability to manage health-related needs will improve Outcome: Not Progressing   Problem: Clinical Measurements: Goal: Ability to maintain clinical measurements within normal  limits will improve Outcome: Not Progressing Goal: Will remain free from infection Outcome: Not Progressing Goal: Diagnostic test results will improve Outcome: Not Progressing Goal: Respiratory complications will improve Outcome: Not Progressing Goal: Cardiovascular complication will be avoided Outcome: Not Progressing   Problem: Activity: Goal: Risk for activity intolerance will decrease Outcome: Not Progressing   Problem: Nutrition: Goal: Adequate nutrition will be maintained Outcome: Not Progressing   Problem: Coping: Goal: Level of anxiety will decrease Outcome: Not Progressing   Problem: Elimination: Goal: Will not experience complications related to bowel motility Outcome: Not Progressing Goal: Will not experience complications related to urinary retention Outcome: Not Progressing   Problem: Pain Managment: Goal: General experience of comfort will improve Outcome: Not Progressing   Problem: Safety: Goal: Ability to remain free from injury will improve Outcome: Not Progressing   Problem: Skin Integrity: Goal: Risk for impaired skin integrity will decrease Outcome: Not Progressing

## 2022-09-29 NOTE — Assessment & Plan Note (Signed)
BMI 35.58

## 2022-09-29 NOTE — Hospital Course (Signed)
77 y.o. male with medical history significant of paroxysmal A-fib on warfarin, type 2 diabetes, hypertension, hyperlipidemia, who presents to the ED due to altered mental status and abdominal pain.   Dustin Estrada states he had a colonoscopy today, once he arrived home, he was experiencing nausea with nonbilious, nonbloody vomiting.  In addition, he developed a fever up to 102.  He does not recall becoming confused.  His wife at bedside states that he gradually became more and more disoriented as the day went on.  That was why she brought him to the ER in addition to his severe abdominal pain.  Dustin Estrada does not recall having severe abdominal pain, but states he is having some tenderness in the right lower region.  He denies any nausea at this time and states abdominal pain has improved.   ED course: On arrival to the ED, patient was hypertensive at 150/125 with heart rate of 70.  He was saturating at 90% on room air.  He was afebrile at 98.8. Initial workup notable for WBC of 14.0, glucose of 198, creatinine 1.37 with GFR 54.  CT of the head and CTA of the head/neck were obtained that did not show any acute infarct, and no evidence of LVO.  CT of the abdomen was obtained that demonstrated surgical clip in the cecum at the site of polypectomy with adjacent mild pericolonic inflammatory changes but no evidence of perforation or abscess.  Due to continued altered mental status, [facility] contacted for admission.  8/14.  Patient feeling much better.  Still having abdominal soreness.  Will start antibiotics with white count being very elevated.  Case discussed with neurology and no need for MRI.

## 2022-09-29 NOTE — Progress Notes (Signed)
Subjective: Significant improvement overnight  Exam: Vitals:   09/29/22 0310 09/29/22 0851  BP: (!) 103/54 (!) 93/48  Pulse: 89 70  Resp: 18 19  Temp: 98.4 F (36.9 C) 98.9 F (37.2 C)  SpO2: 95% 96%   Gen: In bed, NAD Resp: non-labored breathing, no acute distress Abd: soft, nt  Neuro: MS: Awake, alert, oriented CN: Pupils equal round and reactive, EOMI, face symmetric Motor: 5/5 throughout Sensory: Intact to light touch   Pertinent Labs: WBC 24  Impression: 77 year old male with progressive confusion after procedure consistent with delirium.  With no focal findings when I examined him yesterday, and with his improvement, I think that an MRI is very low yield.  At this point, I would treat his underlying conditions, with further neurological workup only if he were to develop new symptoms.  Recommendations: 1) no further recommendations at this time, please call if further questions or concerns.  Ritta Slot, MD [facility] [phone]  If 7pm- 7am, please page neurology on call as listed in [facility]ION.

## 2022-09-29 NOTE — Inpatient Diabetes Management (Signed)
Inpatient Diabetes Program Recommendations  AACE/ADA: New Consensus Statement on Inpatient Glycemic Control (2015)  Target Ranges:  Prepandial:   less than 140 mg/dL      Peak postprandial:   less than 180 mg/dL (1-2 hours)      Critically ill patients:  140 - 180 mg/dL   Lab Results  Component Value Date   GLUCAP 297 (H) 09/29/2022   HGBA1C 6.8 (H) 06/03/2020    Review of Glycemic Control  Latest Reference Range & Units 09/28/22 19:15 09/28/22 23:53 09/29/22 08:46 09/29/22 10:57  Glucose-Capillary 70 - 99 mg/dL 782 (H) 956 (H) 213 (H) 297 (H)   Diabetes history: DM 2 Outpatient Diabetes medications:  Toujeo 36 units daily Humalog 15 units bid with largest meals of day Current orders for Inpatient glycemic control:  Novolog 0-9 units tid with meals Semglee 20 units daily  Inpatient Diabetes Program Recommendations:    Agree with increase in Semglee. May also consider adding Novolog meal coverage 5 units tid with meals (hold if patient eats less than 50% or NPO).    Thanks,  Beryl Meager, RN, BC-ADM Inpatient Diabetes Coordinator Pager [phone]  (8a-5p)

## 2022-09-29 NOTE — Assessment & Plan Note (Signed)
-  Continue Lipitor °

## 2022-09-29 NOTE — Evaluation (Signed)
Physical Therapy Evaluation Patient Details Name: Dustin Estrada MRN: [id number] DOB: [birth year] Today's Date: 09/29/2022  History of Present Illness  77 y.o. male with medical history significant of paroxysmal A-fib on warfarin, type 2 diabetes, hypertension, hyperlipidemia, who presents to the ED due to altered mental status and abdominal pain.  Clinical Impression  Pt did very well with PT exam, he showed functional strength and balance.  Was independent with mobility, transfers and easily circumambulated the nurses' station at community appropriate speed w/o AD.  Pt with no LOBs or safety concerns, and endorses feeling back to baseline (wife present and confirms as well).  Pt does not require further PT intervention, will sign off.       If plan is discharge home, recommend the following:     Can travel by private vehicle    yes    Equipment Recommendations None recommended by PT  Recommendations for Other Services       Functional Status Assessment Patient has not had a recent decline in their functional status     Precautions / Restrictions Precautions Precautions: Fall Restrictions Weight Bearing Restrictions: No      Mobility  Bed Mobility Overal bed mobility: Modified Independent             General bed mobility comments: easily transitions to sitting EOB, indepdenent but less smooth getting LEs back up into bed    Transfers Overall transfer level: Independent Equipment used: None               General transfer comment: rises to standing w/o assist, no LOBs, good confidence w/o AD    Ambulation/Gait Ambulation/Gait assistance: Independent Gait Distance (Feet): 350 Feet Assistive device: None         General Gait Details: Pt was able to maintain consistent, confident cadence at community appropriate speed.  SpO2 remains in the mid 90s on room air, HR 80s up to 90s the the effort.  No safety concerns or fatigue.  Stairs             Wheelchair Mobility     Tilt Bed    Modified Rankin (Stroke Patients Only)       Balance Overall balance assessment: Independent                                           Pertinent Vitals/Pain Pain Assessment Pain Assessment: 0-10 Pain Score: 2  Pain Location: minimal, improving R abdominal pain    Home Living Family/patient expects to be discharged to:: Private residence Living Arrangements: Spouse/significant other Available Help at Discharge: Available 24 hours/day;Family   Home Access: Stairs to enter Entrance Stairs-Rails: Right Entrance Stairs-Number of Steps: 1.5     Home Equipment: Agricultural consultant (2 wheels);Cane - single point      Prior Function Prior Level of Function : Independent/Modified Independent             Mobility Comments: pt reports he is able to stay very active, drive, etc ADLs Comments: independent     Extremity/Trunk Assessment   Upper Extremity Assessment Upper Extremity Assessment: Overall WFL for tasks assessed    Lower Extremity Assessment Lower Extremity Assessment: Overall WFL for tasks assessed       Communication   Communication Communication: No apparent difficulties  Cognition Arousal: Alert Behavior During Therapy: WFL for tasks assessed/performed Overall Cognitive Status: Within Functional Limits for  tasks assessed                                 General Comments: per wife he appears back to baseline        General Comments General comments (skin integrity, edema, etc.): Pt reports feeling night and day different from yesterday    Exercises     Assessment/Plan    PT Assessment Patient does not need any further PT services  PT Problem List  (back to baseline)       PT Treatment Interventions      PT Goals (Current goals can be found in the Care Plan section)  Acute Rehab PT Goals Patient Stated Goal: go home tomorrow PT Goal Formulation: All assessment and  education complete, DC therapy    Frequency       Co-evaluation               AM-PAC PT "6 Clicks" Mobility  Outcome Measure Help needed turning from your back to your side while in a flat bed without using bedrails?: None Help needed moving from lying on your back to sitting on the side of a flat bed without using bedrails?: None Help needed moving to and from a bed to a chair (including a wheelchair)?: None Help needed standing up from a chair using your arms (e.g., wheelchair or bedside chair)?: None Help needed to walk in hospital room?: None Help needed climbing 3-5 steps with a railing? : None 6 Click Score: 24    End of Session Equipment Utilized During Treatment: Gait belt Activity Tolerance: Patient tolerated treatment well Patient left: in bed;with call bell/phone within reach;with nursing/sitter in room;with family/visitor present Nurse Communication: Mobility status PT Visit Diagnosis: Unsteadiness on feet (R26.81);Muscle weakness (generalized) (M62.81);Difficulty in walking, not elsewhere classified (R26.2)    Time: 4098-1191 PT Time Calculation (min) (ACUTE ONLY): 11 min   Charges:   PT Evaluation $PT Eval Low Complexity: 1 Low   PT General Charges $$ ACUTE PT VISIT: 1 Visit         Malachi Pro, DPT 09/29/2022, 5:54 PM

## 2022-09-29 NOTE — Assessment & Plan Note (Addendum)
Likely secondary to anesthesia and procedure.  Could also be secondary to infection.  Much improved.

## 2022-09-29 NOTE — Progress Notes (Signed)
Progress Note   Patient: Dustin Estrada UJW:[id number] DOB: 07-Mar-1945 DOA: 09/28/2022     0 DOS: the patient was seen and examined on 09/29/2022   Brief hospital course: 77 y.o. male with medical history significant of paroxysmal A-fib on warfarin, type 2 diabetes, hypertension, hyperlipidemia, who presents to the ED due to altered mental status and abdominal pain.   Dustin Estrada states he had a colonoscopy today, once he arrived home, he was experiencing nausea with nonbilious, nonbloody vomiting.  In addition, he developed a fever up to 102.  He does not recall becoming confused.  His wife at bedside states that he gradually became more and more disoriented as the day went on.  That was why she brought him to the ER in addition to his severe abdominal pain.  Dustin Estrada does not recall having severe abdominal pain, but states he is having some tenderness in the right lower region.  He denies any nausea at this time and states abdominal pain has improved.   ED course: On arrival to the ED, patient was hypertensive at 150/125 with heart rate of 70.  He was saturating at 90% on room air.  He was afebrile at 98.8. Initial workup notable for WBC of 14.0, glucose of 198, creatinine 1.37 with GFR 54.  CT of the head and CTA of the head/neck were obtained that did not show any acute infarct, and no evidence of LVO.  CT of the abdomen was obtained that demonstrated surgical clip in the cecum at the site of polypectomy with adjacent mild pericolonic inflammatory changes but no evidence of perforation or abscess.  Due to continued altered mental status, [facility] contacted for admission.  8/14.  Patient feeling much better.  Still having abdominal soreness.  Will start antibiotics with white count being very elevated.  Case discussed with neurology and no need for MRI.  Assessment and Plan: * Hypotension Continue IV fluids today and hold antihypertensive medications.  Acute metabolic encephalopathy Likely  secondary to anesthesia and procedure.  Could also be secondary to infection.  Much improved.  Postpolypectomy electrocoagulation syndrome Restart Rocephin and Flagyl.  Follow-up blood cultures.  Patient is still having some abdominal soreness.  Obesity (BMI 30-39.9) BMI 35.58  PAF (paroxysmal atrial fibrillation) (HCC) Continue to hold Coumadin  Hyperlipidemia, unspecified Continue Lipitor  Uncontrolled type 2 diabetes mellitus with hyperglycemia, with long-term current use of insulin (HCC) Advance diet.  Start NovoLog 5 units prior to meals and continue Semglee insulin.        Subjective: Called by nursing staff secondary to hypotension.  Patient feeling better today.  Had shaking chills yesterday altered mental status and abdominal pain.  Patient has some abdominal soreness but feels better today.  Physical Exam: Vitals:   09/28/22 2350 09/29/22 0049 09/29/22 0310 09/29/22 0851  BP: 134/68 (!) 112/59 (!) 103/54 (!) 93/48  Pulse: (!) 111 (!) 104 89 70  Resp: 18 18 18 19   Temp: 99.8 F (37.7 C) 98.6 F (37 C) 98.4 F (36.9 C) 98.9 F (37.2 C)  TempSrc:   Oral   SpO2: 97% 97% 95% 96%  Weight:      Height:       Physical Exam HENT:     Head: Normocephalic.     Mouth/Throat:     Pharynx: No oropharyngeal exudate.  Eyes:     General: Lids are normal.     Conjunctiva/sclera: Conjunctivae normal.  Cardiovascular:     Rate and Rhythm: Normal rate and regular  rhythm.     Heart sounds: Normal heart sounds, S1 normal and S2 normal.  Pulmonary:     Breath sounds: No decreased breath sounds, wheezing, rhonchi or rales.  Abdominal:     Palpations: Abdomen is soft.     Tenderness: There is abdominal tenderness in the right upper quadrant, right lower quadrant and suprapubic area.  Musculoskeletal:     Right lower leg: No swelling.     Left lower leg: No swelling.  Skin:    General: Skin is warm.     Findings: No rash.  Neurological:     Mental Status: He is alert and  oriented to person, place, and time.     Data Reviewed: CT scan of the abdomen shows surgical clip in the cecum at the site of large polyp removal.  Mild pericolonic inflammatory changes postprocedural.  No drainable fluid collection or abscess or free air to suggest macroscopic perforation.  White blood cell count up at 24.5, hemoglobin 12.8, platelet count 144, creatinine 1.6 Family Communication: Spoke with daughter and wife at the bedside  Disposition: Status is: Inpatient Remains inpatient appropriate because: With hypotension this morning and elevated white count we will start antibiotics and watch another day in the hospital.  Continue IV fluids today.  Planned Discharge Destination: Home    Time spent: 28 minutes  Author: Alford Highland, MD 09/29/2022 1:41 PM  For on call review [email or web address].

## 2022-09-30 DIAGNOSIS — N179 Acute kidney failure, unspecified: Secondary | ICD-10-CM

## 2022-09-30 DIAGNOSIS — G9341 Metabolic encephalopathy: Secondary | ICD-10-CM | POA: Diagnosis not present

## 2022-09-30 DIAGNOSIS — N189 Chronic kidney disease, unspecified: Secondary | ICD-10-CM

## 2022-09-30 DIAGNOSIS — K9189 Other postprocedural complications and disorders of digestive system: Secondary | ICD-10-CM | POA: Diagnosis not present

## 2022-09-30 DIAGNOSIS — I9589 Other hypotension: Secondary | ICD-10-CM | POA: Diagnosis not present

## 2022-09-30 LAB — BASIC METABOLIC PANEL
Anion gap: 10 (ref 5–15)
BUN: 21 mg/dL (ref 8–23)
CO2: 23 mmol/L (ref 22–32)
Calcium: 8.5 mg/dL — ABNORMAL LOW (ref 8.9–10.3)
Chloride: 105 mmol/L (ref 98–111)
Creatinine, Ser: 1.26 mg/dL — ABNORMAL HIGH (ref 0.61–1.24)
GFR, Estimated: 59 mL/min — ABNORMAL LOW (ref >60)
Glucose, Bld: 157 mg/dL — ABNORMAL HIGH (ref 70–99)
Potassium: 3.8 mmol/L (ref 3.5–5.1)
Sodium: 138 mmol/L (ref 135–145)

## 2022-09-30 LAB — CBC
HCT: 35.5 % — ABNORMAL LOW (ref 39.0–52.0)
Hemoglobin: 12 g/dL — ABNORMAL LOW (ref 13.0–17.0)
MCH: 31.9 pg (ref 26.0–34.0)
MCHC: 33.8 g/dL (ref 30.0–36.0)
MCV: 94.4 fL (ref 80.0–100.0)
Platelets: 137 10*3/uL — ABNORMAL LOW (ref 150–400)
RBC: 3.76 MIL/uL — ABNORMAL LOW (ref 4.22–5.81)
RDW: 12.9 % (ref 11.5–15.5)
WBC: 16 10*3/uL — ABNORMAL HIGH (ref 4.0–10.5)
nRBC: 0 % (ref 0.0–0.2)

## 2022-09-30 LAB — GLUCOSE, CAPILLARY: Glucose-Capillary: 148 mg/dL — ABNORMAL HIGH (ref 70–99)

## 2022-09-30 MED ORDER — AMOXICILLIN-POT CLAVULANATE 875-125 MG PO TABS
1.0000 | ORAL_TABLET | Freq: Two times a day (BID) | ORAL | Status: DC
  Administered 2022-09-30: 1 via ORAL
  Filled 2022-09-30: qty 1

## 2022-09-30 MED ORDER — AMOXICILLIN-POT CLAVULANATE 875-125 MG PO TABS: 1.0000 | ORAL_TABLET | Freq: Two times a day (BID) | ORAL | Status: DC

## 2022-09-30 MED ORDER — AMOXICILLIN-POT CLAVULANATE 875-125 MG PO TABS: 1.0000 | ORAL_TABLET | Freq: Two times a day (BID) | ORAL | 0 refills | Status: AC

## 2022-09-30 NOTE — Assessment & Plan Note (Signed)
Acute kidney injury on CKD stage IIIa.  Creatinine did go up to 1.6 and is down to 1.26 upon discharge.  Hold Cozaar recheck creatinine as outpatient.

## 2022-09-30 NOTE — Discharge Instructions (Signed)
Restart coumadin on Saturday

## 2022-09-30 NOTE — Discharge Summary (Signed)
Physician Discharge Summary   Patient: Dustin Estrada MRN: [id number] DOB: 08/21/1945  Admit date:     09/28/2022  Discharge date: 09/30/22  Discharge Physician: Alford Highland   PCP: Gracelyn Nurse, MD   Recommendations at discharge:   Follow-up PCP 5 days  Discharge Diagnoses: Principal Problem:   Hypotension Active Problems:   Acute metabolic encephalopathy   Postpolypectomy electrocoagulation syndrome   Acute kidney injury superimposed on CKD (HCC)   Uncontrolled type 2 diabetes mellitus with hyperglycemia, with long-term current use of insulin (HCC)   PAF (paroxysmal atrial fibrillation) (HCC)   Obesity (BMI 30-39.9)   Hyperlipidemia, unspecified    Hospital Course: 77 y.o. male with medical history significant of paroxysmal A-fib on warfarin, type 2 diabetes, hypertension, hyperlipidemia, who presents to the ED due to altered mental status and abdominal pain.   Dustin Estrada states he had a colonoscopy 8/13, once he arrived home, he was experiencing nausea with nonbilious, nonbloody vomiting.  In addition, he developed a fever up to 102.  He does not recall becoming confused.  His wife at bedside states that he gradually became more and more disoriented as the day went on.  That was why she brought him to the ER in addition to his severe abdominal pain.  Dustin Estrada does not recall having severe abdominal pain, but states he is having some tenderness in the right lower region.  He denies any nausea at this time and states abdominal pain has improved.   ED course: On arrival to the ED, patient was hypertensive at 150/125 with heart rate of 70.  He was saturating at 90% on room air.  He was afebrile at 98.8. Initial workup notable for WBC of 14.0, glucose of 198, creatinine 1.37 with GFR 54.  CT of the head and CTA of the head/neck were obtained that did not show any acute infarct, and no evidence of LVO.  CT of the abdomen was obtained that demonstrated surgical clip in the cecum  at the site of polypectomy with adjacent mild pericolonic inflammatory changes but no evidence of perforation or abscess.  Due to continued altered mental status, [facility] contacted for admission.  8/14.  Patient feeling much better.  Still having abdominal soreness.  Will start antibiotics with white count being very elevated.  Case discussed with neurology and no need for MRI. 8/15.  Patient again feeling better than the day before.  Only slight soreness in his abdomen.  Will complete a course with antibiotics of Augmentin upon discharge.  Assessment and Plan: * Hypotension Improved with IV fluids.  Can go back on propranolol and Flomax.  Hold Cozaar.  Check blood pressure and follow-up appointment.  Acute metabolic encephalopathy Likely secondary to anesthesia and procedure.  Could also be secondary to infection.  Much improved.  Postpolypectomy electrocoagulation syndrome Patient still has some soreness in his abdomen.  No further fever.  Patient was given Rocephin and Flagyl here.  Will give 3 more days of Augmentin upon discharge.  Blood cultures negative for 2 days.  White blood cell count trending better from 24.5 down to 16.  Acute kidney injury superimposed on CKD (HCC) Acute kidney injury on CKD stage IIIa.  Creatinine did go up to 1.6 and is down to 1.26 upon discharge.  Hold Cozaar recheck creatinine as outpatient.  Uncontrolled type 2 diabetes mellitus with hyperglycemia, with long-term current use of insulin (HCC) Patient tolerated advanced diet.  Can go back on his usual regimen at home.  PAF (  paroxysmal atrial fibrillation) [facility]) Patient will restart Coumadin on Saturday.  Patient can go back on propranolol.  Obesity (BMI 30-39.9) BMI 35.58  Hyperlipidemia, unspecified Continue Lipitor         Consultants: none Procedures performed: None Disposition: Home Diet recommendation:  Cardiac and Carb modified diet DISCHARGE MEDICATION: Allergies as of 09/30/2022   No  Known Allergies      Medication List     STOP taking these medications    insulin lispro protamine-lispro (50-50) 100 UNIT/ML Susp injection Commonly known as: HUMALOG 50/50 MIX   losartan 50 MG tablet Commonly known as: COZAAR   nystatin cream Commonly known as: MYCOSTATIN   warfarin 5 MG tablet Commonly known as: COUMADIN       TAKE these medications    amoxicillin-clavulanate 875-125 MG tablet Commonly known as: AUGMENTIN Take 1 tablet by mouth every 12 (twelve) hours for 5 doses.   atorvastatin 20 MG tablet Commonly known as: LIPITOR Take 20 mg by mouth every evening.   b complex vitamins tablet Take 1 tablet by mouth daily.   CALCIUM 600+D3 PO Take 1 tablet by mouth daily.   docusate sodium 100 MG capsule Commonly known as: COLACE Take 100 mg by mouth at bedtime.   insulin lispro 100 UNIT/ML KwikPen Commonly known as: HUMALOG Inject 15 Units into the skin 2 (two) times daily before lunch and supper.   MULTIVITAMIN PO Take 2 tablets by mouth daily.   propranolol 10 MG tablet Commonly known as: INDERAL Take 10 mg by mouth 2 (two) times daily.   tamsulosin 0.4 MG Caps capsule Commonly known as: FLOMAX Take 0.4 mg by mouth.   Toujeo SoloStar 300 UNIT/ML Solostar Pen Generic drug: insulin glargine (1 Unit Dial) Inject 36 Units into the skin every evening.        Follow-up Information     Gracelyn Nurse, MD Follow up in 5 day(s).   Specialty: Internal Medicine Contact information: [address] [place] [address] [phone]                Discharge Exam: Ceasar Mons Weights   09/28/22 1710  Weight: 106.1 kg   Physical Exam HENT:     Head: Normocephalic.     Mouth/Throat:     Pharynx: No oropharyngeal exudate.  Eyes:     General: Lids are normal.     Conjunctiva/sclera: Conjunctivae normal.  Cardiovascular:     Rate and Rhythm: Normal rate and regular rhythm.     Heart sounds: Normal heart sounds, S1 normal and  S2 normal.  Pulmonary:     Breath sounds: No decreased breath sounds, wheezing, rhonchi or rales.  Abdominal:     Palpations: Abdomen is soft.     Tenderness: There is abdominal tenderness in the right lower quadrant.  Musculoskeletal:     Right lower leg: No swelling.     Left lower leg: No swelling.  Skin:    General: Skin is warm.     Findings: No rash.  Neurological:     Mental Status: He is alert and oriented to person, place, and time.      Condition at discharge: stable  The results of significant diagnostics from this hospitalization (including imaging, microbiology, ancillary and laboratory) are listed below for reference.   Imaging Studies: CT ABDOMEN PELVIS W CONTRAST  Result Date: 09/28/2022 CLINICAL DATA:  Colonoscopy, status post large polyp removal, now with abdominal pain EXAM: CT ABDOMEN AND PELVIS WITH CONTRAST TECHNIQUE: Multidetector CT imaging  of the abdomen and pelvis was performed using the standard protocol following bolus administration of intravenous contrast. RADIATION DOSE REDUCTION: This exam was performed according to the departmental dose-optimization program which includes automated exposure control, adjustment of the mA and/or kV according to patient size and/or use of iterative reconstruction technique. CONTRAST:  75mL OMNIPAQUE IOHEXOL 300 MG/ML  SOLN COMPARISON:  04/27/2022 FINDINGS: Lower chest: Lung bases are clear. Hepatobiliary: Liver is within normal limits. Status post cholecystectomy. No intrahepatic or extrahepatic dilatation. Pancreas: Within normal limits. Spleen: Within normal limits. Adrenals/Urinary Tract: Adrenal glands are within normal limits. Kidneys within normal limits.  No hydronephrosis. Bladder is within normal limits. 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 No evidence of bowel obstruction. Normal appendix (series 2/image 84). Surgical clip in the cecum at the site of polypectomy.  Adjacent mild pericolonic inflammatory changes (series 2/image 83) without drainable fluid collection/abscess or free air to suggest macroscopic perforation. Left colonic diverticulosis, without evidence of diverticulitis. Vascular/Lymphatic: No evidence of abdominal aortic aneurysm. No suspicious abdominopelvic lymphadenopathy. Reproductive: Prostatomegaly, with enlargement of the central gland indenting the base of the bladder, suggesting BPH. Other: No abdominopelvic ascites. Musculoskeletal: Degenerative changes of the visualized thoracolumbar spine. Grade 1 spondylolisthesis of L5 on S1. IMPRESSION: Surgical clip in the cecum at the site of polypectomy. Adjacent mild pericolonic inflammatory changes, postprocedural. No drainable fluid collection/abscess or free air to suggest macroscopic perforation. Left colonic diverticulosis, without evidence of diverticulitis. Additional ancillary findings as above. Electronically Signed   By: Charline Bills M.D.   On: 09/28/2022 20:24   CT HEAD CODE STROKE WO CONTRAST  Result Date: 09/28/2022 CLINICAL DATA:  Nausea, vomiting, chills. Dysarthria and left-sided weakness. Code stroke. EXAM: CT ANGIOGRAPHY HEAD AND NECK CT PERFUSION BRAIN TECHNIQUE: Multidetector CT imaging of the head and neck was performed using the standard protocol during bolus administration of intravenous contrast. Multiplanar CT image reconstructions and MIPs were obtained to evaluate the vascular anatomy. Carotid stenosis measurements (when applicable) are obtained utilizing NASCET criteria, using the distal internal carotid diameter as the denominator. Multiphase CT imaging of the brain was performed following IV bolus contrast injection. Subsequent parametric perfusion maps were calculated using RAPID software. RADIATION DOSE REDUCTION: This exam was performed according to the departmental dose-optimization program which includes automated exposure control, adjustment of the mA and/or kV  according to patient size and/or use of iterative reconstruction technique. CONTRAST:  OMNIPAQUE IOHEXOL 350 MG/ML SOLN COMPARISON:  None Available. FINDINGS: CT HEAD FINDINGS Brain: There is no acute intracranial hemorrhage, extra-axial fluid collection, or acute territorial infarct. ASPECTS is 10. Background parenchymal volume is normal for age. The ventricles are normal in size. Gray-white differentiation is preserved. There has an age-indeterminate but remote appearing infarct in the left frontal periventricular white matter. Additional patchy hypodensity in the supratentorial white matter likely reflects sequela of underlying chronic small-vessel ischemic change. The pituitary and suprasellar region are normal. There is no mass lesion. There is no mass effect or midline shift. Vascular: See below. Skull: There is no calvarial fracture. A lucent lesion in the midline occipital calvarium is favored to reflect a benign vascular lesion. Sinuses/Orbits: The paranasal sinuses are clear. Bilateral lens implants are in place. The globes and orbits are otherwise unremarkable. Other: The mastoid air cells and middle ear cavities are clear. Review of the MIP images confirms the above findings CTA NECK FINDINGS Aortic arch: There is a minimal calcified plaque in the imaged aortic arch. The origins of the major branch  vessels are patent. The subclavian arteries are patent to the level imaged. Right carotid system: The right common, internal, and external carotid arteries are patent, without hemodynamically significant stenosis or occlusion. There is no evidence of dissection or aneurysm. Left carotid system: The left common, internal, and external carotid arteries are patent, with minimal plaque at the bifurcation but no hemodynamically significant stenosis or occlusion there is no evidence of dissection or aneurysm. Vertebral arteries: The vertebral arteries are patent, without hemodynamically significant stenosis or  occlusion there is no evidence of dissection or aneurysm. Skeleton: There is no acute osseous abnormality or suspicious osseous lesion. There is no visible canal hematoma. Other neck: The soft tissues of the neck are unremarkable. Upper chest: The imaged lung apices are clear. Review of the MIP images confirms the above findings CTA HEAD FINDINGS Anterior circulation: There is mild calcified plaque in the intracranial ICAs without significant stenosis or occlusion. The bilateral MCAs are patent. There is short-segment high-grade stenosis of a small right M2 branch in the sylvian fissure (7-95, 6-91, 10-20). The bilateral ACAS are patent, without proximal stenosis or occlusion. The anterior communicating artery is not definitely seen. There is no aneurysm or AVM. Posterior circulation: The bilateral V4 segments are patent. The basilar artery is patent. The major cerebellar arteries appear patent. The bilateral PCAs are patent, with mild multifocal narrowing of the left P1 and P2 segments, and focal moderate stenosis of the right P2 segment (7-98). Posterior communicating arteries are not identified. There is no aneurysm or AVM. Venous sinuses: Patent. Anatomic variants: None. Review of the MIP images confirms the above findings CT Brain Perfusion Findings: ASPECTS: 10 CBF (<30%) Volume: 0mL Perfusion (Tmax>6.0s) volume: 0mL Mismatch Volume: 0mL Infarction Location:n/a IMPRESSION: 1. No acute intracranial hemorrhage or large vessel territorial infarct. 2. 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 3.  No emergent vascular finding. 4. Negative CT perfusion. 5. Short segment high-grade stenosis of the small right M2 branch in the sylvian fissure. 6. Atherosclerotic irregularity of both PCAs with up to moderate stenosis of the right P2 segment. 7. No hemodynamically significant stenosis or occlusion in the neck. These results were called by telephone at the time  of interpretation on 09/28/2022 at 7:30 pm to provider EVAN BRADLER ; [facility] , who verbally acknowledged these results. Electronically Signed   By: Lesia Hausen M.D.   On: 09/28/2022 19:46   CT ANGIO HEAD NECK W WO CM W PERF (CODE STROKE)  Result Date: 09/28/2022 CLINICAL DATA:  Nausea, vomiting, chills. Dysarthria and left-sided weakness. Code stroke. EXAM: CT ANGIOGRAPHY HEAD AND NECK CT PERFUSION BRAIN TECHNIQUE: Multidetector CT imaging of the head and neck was performed using the standard protocol during bolus administration of intravenous contrast. Multiplanar CT image reconstructions and MIPs were obtained to evaluate the vascular anatomy. Carotid stenosis measurements (when applicable) are obtained utilizing NASCET criteria, using the distal internal carotid diameter as the denominator. Multiphase CT imaging of the brain was performed following IV bolus contrast injection. Subsequent parametric perfusion maps were calculated using RAPID software. RADIATION DOSE REDUCTION: This exam was performed according to the departmental dose-optimization program which includes automated exposure control, adjustment of the mA and/or kV according to patient size and/or use of iterative reconstruction technique. CONTRAST:  OMNIPAQUE IOHEXOL 350 MG/ML SOLN COMPARISON:  None Available. FINDINGS: CT HEAD FINDINGS Brain: There is no acute intracranial hemorrhage, extra-axial fluid collection, or acute territorial infarct. ASPECTS is 10. Background parenchymal volume is  normal for age. The ventricles are normal in size. Gray-white differentiation is preserved. There has an age-indeterminate but remote appearing infarct in the left frontal periventricular white matter. Additional patchy hypodensity in the supratentorial white matter likely reflects sequela of underlying chronic small-vessel ischemic change. The pituitary and suprasellar region are normal. There is no mass lesion. There is no mass effect or  midline shift. Vascular: See below. Skull: There is no calvarial fracture. A lucent lesion in the midline occipital calvarium is favored to reflect a benign vascular lesion. Sinuses/Orbits: The paranasal sinuses are clear. Bilateral lens implants are in place. The globes and orbits are otherwise unremarkable. Other: The mastoid air cells and middle ear cavities are clear. Review of the MIP images confirms the above findings CTA NECK FINDINGS Aortic arch: There is a minimal calcified plaque in the imaged aortic arch. The origins of the major branch vessels are patent. The subclavian arteries are patent to the level imaged. Right carotid system: The right common, internal, and external carotid arteries are patent, without hemodynamically significant stenosis or occlusion. There is no evidence of dissection or aneurysm. Left carotid system: The left common, internal, and external carotid arteries are patent, with minimal plaque at the bifurcation but no hemodynamically significant stenosis or occlusion there is no evidence of dissection or aneurysm. Vertebral arteries: The vertebral arteries are patent, without hemodynamically significant stenosis or occlusion there is no evidence of dissection or aneurysm. Skeleton: There is no acute osseous abnormality or suspicious osseous lesion. There is no visible canal hematoma. Other neck: The soft tissues of the neck are unremarkable. Upper chest: The imaged lung apices are clear. Review of the MIP images confirms the above findings CTA HEAD FINDINGS Anterior circulation: There is mild calcified plaque in the intracranial ICAs without significant stenosis or occlusion. The bilateral MCAs are patent. There is short-segment high-grade stenosis of a small right M2 branch in the sylvian fissure (7-95, 6-91, 10-20). The bilateral ACAS are patent, without proximal stenosis or occlusion. The anterior communicating artery is not definitely seen. There is no aneurysm or AVM. Posterior  circulation: The bilateral V4 segments are patent. The basilar artery is patent. The major cerebellar arteries appear patent. The bilateral PCAs are patent, with mild multifocal narrowing of the left P1 and P2 segments, and focal moderate stenosis of the right P2 segment (7-98). Posterior communicating arteries are not identified. There is no aneurysm or AVM. Venous sinuses: Patent. Anatomic variants: None. Review of the MIP images confirms the above findings CT Brain Perfusion Findings: ASPECTS: 10 CBF (<30%) Volume: 0mL Perfusion (Tmax>6.0s) volume: 0mL Mismatch Volume: 0mL Infarction Location:n/a IMPRESSION: 1. No acute intracranial hemorrhage or large vessel territorial infarct. 2. 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 3.  No emergent vascular finding. 4. Negative CT perfusion. 5. Short segment high-grade stenosis of the small right M2 branch in the sylvian fissure. 6. Atherosclerotic irregularity of both PCAs with up to moderate stenosis of the right P2 segment. 7. No hemodynamically significant stenosis or occlusion in the neck. These results were called by telephone at the time of interpretation on 09/28/2022 at 7:30 pm to provider EVAN BRADLER ; [facility] , who verbally acknowledged these results. Electronically Signed   By: Lesia Hausen M.D.   On: 09/28/2022 19:46    Microbiology: Results for orders placed or performed during the hospital encounter of 09/28/22  Blood culture (routine x 2)     Status: None (Preliminary result)   Collection  Time: 09/28/22  9:52 PM   Specimen: BLOOD  Result Value Ref Range Status   Specimen Description BLOOD BLOOD RIGHT ARM  Final   Special Requests   Final    BOTTLES DRAWN AEROBIC AND ANAEROBIC Blood Culture adequate volume   Culture   Final    NO GROWTH 2 DAYS Performed at [facility], [address] [place], [place] [address]    Report Status PENDING  Incomplete  Blood  culture (routine x 2)     Status: None (Preliminary result)   Collection Time: 09/28/22  9:52 PM   Specimen: BLOOD  Result Value Ref Range Status   Specimen Description BLOOD BLOOD RIGHT HAND  Final   Special Requests   Final    BOTTLES DRAWN AEROBIC AND ANAEROBIC Blood Culture results may not be optimal due to an excessive volume of blood received in culture bottles   Culture   Final    NO GROWTH 2 DAYS Performed at [facility], [address] [place], [place] [address]    Report Status PENDING  Incomplete    Labs: CBC: Recent Labs  Lab 09/28/22 1800 09/29/22 0327 09/30/22 0330  WBC 14.0* 24.5* 16.0*  HGB 14.8 12.8* 12.0*  HCT 44.2 38.8* 35.5*  MCV 94.6 95.8 94.4  PLT 178 144* 137*   Basic Metabolic Panel: Recent Labs  Lab 09/28/22 1800 09/29/22 0327 09/30/22 0330  NA 138 137 138  K 4.7 4.0 3.8  CL 104 104 105  CO2 23 22 23   GLUCOSE 198* 242* 157*  BUN 19 20 21   CREATININE 1.37* 1.60* 1.26*  CALCIUM 9.5 8.6* 8.5*   Liver Function Tests: Recent Labs  Lab 09/28/22 1800  AST 29  ALT 30  ALKPHOS 82  BILITOT 1.2  PROT 7.5  ALBUMIN 4.2   CBG: Recent Labs  Lab 09/29/22 0846 09/29/22 1057 09/29/22 1554 09/29/22 2117 09/30/22 0741  GLUCAP 243* 297* 216* 185* 148*    Discharge time spent: greater than 30 minutes.  Signed: Alford Highland, MD [facility] 09/30/2022

## 2022-09-30 NOTE — Plan of Care (Signed)

## 2022-11-25 ENCOUNTER — Telehealth: Payer: Self-pay | Admitting: Urology

## 2022-11-25 NOTE — Telephone Encounter (Signed)
PT saw [facility] 10/2020.  He has been going to Dr Aundria Rud at [facility] who left the practice.  He wants to come back to [facility], so I scheduled appt for 11/1.  Pt will run out of Tamsulosin 0.4 before then and wants to know if we can send 1 week worth to [facility] on [address] til he gets in w/[facility] on 11/1.

## 2022-11-25 NOTE — Telephone Encounter (Signed)
He has to be a patient before we can send any medication in .

## 2022-12-17 ENCOUNTER — Ambulatory Visit: Payer: Medicare Other | Admitting: Urology

## 2023-01-10 ENCOUNTER — Encounter: Payer: Self-pay | Admitting: Urology

## 2023-01-10 ENCOUNTER — Ambulatory Visit: Payer: Medicare Other | Admitting: Urology

## 2023-01-10 VITALS — BP 164/106 | HR 53 | Ht 68.0 in | Wt 238.0 lb

## 2023-01-10 DIAGNOSIS — N529 Male erectile dysfunction, unspecified: Secondary | ICD-10-CM | POA: Diagnosis not present

## 2023-01-10 DIAGNOSIS — N401 Enlarged prostate with lower urinary tract symptoms: Secondary | ICD-10-CM | POA: Diagnosis not present

## 2023-01-10 NOTE — Progress Notes (Signed)
I, Maysun Anabel Bene, acting as a scribe for Riki Altes, MD., have documented all relevant documentation on the behalf of Riki Altes, MD, as directed by Riki Altes, MD while in the presence of Riki Altes, MD.  01/10/2023 9:52 AM   Dustin Estrada 05/17/1945 [id number]  Referring provider: Gracelyn Nurse, MD [address] [facility] [place],  [place] [address]  Chief Complaint  Patient presents with   Other   Urologic history: 1. 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 s/p exploration with removal of a penile implant.  2. Erectile dysfunction s/p replacement penile implant by Dr. Aundria Rud at [facility] 01/2021.   3. BPH with LUTS Tamsulosin 0.4 mg daily.  HPI: Dustin Estrada is a 77 y.o. male presents to re-establish local urologic care.  He continued to see Dr. Aundria Rud after replacement of his penile implant for BPH on tamsulosin.  He states Dr. Aundria Rud has left [facility] and he presents here to reestablish local urologic care.  He has occasional urinary hesitancy, but otherwise no bothersome lower urinary tract symptoms on tamsulosin.  IPSS today 7/35.    PMH: Past Medical History:  Diagnosis Date   Anxiety    Arthritis    Diabetes mellitus without complication (HCC)    type 2   Dysrhythmia    GERD (gastroesophageal reflux disease)    Hx of blood clots 12/2014   lung   Hypertension    PAF (paroxysmal atrial fibrillation) (HCC)    Shortness of breath dyspnea    on exertion   Sleep apnea    cpap    Surgical History: Past Surgical History:  Procedure Laterality Date   CARDIAC CATHETERIZATION     CHOLECYSTECTOMY     COLONOSCOPY N/A 04/05/2022   Procedure: COLONOSCOPY;  Surgeon: Regis Bill, MD;  Location: [facility];  Service: Endoscopy;  Laterality: N/A;   COLONOSCOPY WITH PROPOFOL N/A 07/28/2015    Procedure: COLONOSCOPY WITH PROPOFOL;  Surgeon: Midge Minium, MD;  Location: [facility];  Service: Endoscopy;  Laterality: N/A;  Insulin dependent diabetic and CPAP   CYSTOSCOPY  06/03/2020   Procedure: CYSTOSCOPY FLEXIBLE;  Surgeon: Riki Altes, MD;  Location: [facility];  Service: Urology;;   ESOPHAGOGASTRODUODENOSCOPY (EGD) WITH PROPOFOL N/A 07/23/2015   Procedure: ESOPHAGOGASTRODUODENOSCOPY (EGD) WITH PROPOFOL;  Surgeon: Midge Minium, MD;  Location: [facility];  Service: Endoscopy;  Laterality: N/A;   KNEE SURGERY Left    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 SWITCH)     MASS EXCISION N/A 02/27/2018   Procedure: EXCISION MASS LEFT LEG;  Surgeon: Carolan Shiver, MD;  Location: [facility];  Service: General;  Laterality: N/A;   PENILE PROSTHESIS IMPLANT     new implant put in 01/19/2021 no implant card available   POLYPECTOMY  07/28/2015   Procedure: POLYPECTOMY;  Surgeon: Midge Minium, MD;  Location: [facility];  Service: Endoscopy;;   REMOVAL OF PENILE PROSTHESIS N/A 06/03/2020   Procedure: REMOVAL OF PENILE PROSTHESIS;  Surgeon: Riki Altes, MD;  Location: [facility];  Service: Urology;  Laterality: N/A;   ROTATOR CUFF REPAIR Right     Home Medications:  Allergies as of 01/10/2023   No Known Allergies      Medication List        Accurate as of January 10, 2023  9:52 AM. If you have any questions, ask your nurse or  doctor.          atorvastatin 20 MG tablet Commonly known as: LIPITOR Take 20 mg by mouth every evening.   b complex vitamins tablet Take 1 tablet by mouth daily.   CALCIUM 600+D3 PO Take 1 tablet by mouth daily.   docusate sodium 100 MG capsule Commonly known as: COLACE Take 100 mg by mouth at bedtime.   insulin lispro 100 UNIT/ML KwikPen Commonly known as: HUMALOG Inject 15 Units into the skin 2 (two) times daily before lunch and supper.   MULTIVITAMIN PO Take 2 tablets by mouth daily.    propranolol 10 MG tablet Commonly known as: INDERAL Take 10 mg by mouth 2 (two) times daily.   tamsulosin 0.4 MG Caps capsule Commonly known as: FLOMAX Take 0.4 mg by mouth.   Toujeo SoloStar 300 UNIT/ML Solostar Pen Generic drug: insulin glargine (1 Unit Dial) Inject 36 Units into the skin every evening.   warfarin 2.5 MG tablet Commonly known as: COUMADIN Take by mouth.        Allergies: No Known Allergies  Family History: Family History  Problem Relation Age of Onset   Diabetes Father    Heart attack Maternal Uncle    Heart attack Maternal Grandfather     Social History:  reports that he has never smoked. He has never used smokeless tobacco. He reports that he does not drink alcohol and does not use drugs.   Physical Exam: BP (!) 164/106   Pulse (!) 53   Ht 5\' 8"  (1.727 m)   Wt 238 lb (108 kg)   BMI 36.19 kg/m   Constitutional:  Alert and oriented, No acute distress. HEENT: [place] AT Respiratory: Normal respiratory effort, no increased work of breathing. Psychiatric: Normal mood and affect.   Assessment & Plan:    1. BPH with LUTS Stable on tamsulosin.  He did not need refills at this time. Schedule annual follow-up.   2. Erectile dysfunction Status post replacement penile prosthesis after prior removal for infection.   I have reviewed the above documentation for accuracy and completeness, and I agree with the above.   Riki Altes, MD  [facility] [address], [address] [place], [place] [address] [phone]

## 2023-08-29 ENCOUNTER — Other Ambulatory Visit: Payer: Self-pay | Admitting: *Deleted

## 2023-08-29 MED ORDER — TAMSULOSIN HCL 0.4 MG PO CAPS: 0.4000 mg | ORAL_CAPSULE | Freq: Every day | ORAL | 3 refills | Status: DC

## 2024-01-03 ENCOUNTER — Ambulatory Visit (INDEPENDENT_AMBULATORY_CARE_PROVIDER_SITE_OTHER): Admitting: Urology

## 2024-01-03 ENCOUNTER — Encounter: Payer: Self-pay | Admitting: Urology

## 2024-01-03 VITALS — BP 157/82 | HR 68 | Ht 67.0 in | Wt 238.0 lb

## 2024-01-03 DIAGNOSIS — N401 Enlarged prostate with lower urinary tract symptoms: Secondary | ICD-10-CM

## 2024-01-03 MED ORDER — TAMSULOSIN HCL 0.4 MG PO CAPS: 0.8000 mg | ORAL_CAPSULE | Freq: Every day | ORAL | 0 refills | Status: DC

## 2024-01-03 NOTE — Progress Notes (Signed)
 01/03/2024 8:26 AM   [name] 24-Oct-1945 [id number]  Referring provider: Rudolpho Norleen BIRCH, MD [address] [facility] [address] [facility] [place],  [place] [address]  Chief Complaint  Patient presents with   Medication Refill   Urologic history:  1. 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 s/p exploration with removal of a penile implant.   2. Erectile dysfunction s/p replacement penile implant by Dr. Sharl at [facility] 01/2021.    3. BPH with LUTS Tamsulosin  0.4 mg daily.  HPI: Dustin Estrada is a 78 y.o. male presents for annual follow-up.  Since last year's visit notes occasional increased urinary frequency and occasional decreased force and caliber of his urinary stream Remains on tamsulosin  0.4 mg daily Denies dysuria, gross hematuria No flank, abdominal or pelvic pain   PMH: Past Medical History:  Diagnosis Date   Anxiety    Arthritis    Diabetes mellitus without complication (HCC)    type 2   Dysrhythmia    GERD (gastroesophageal reflux disease)    Hx of blood clots 12/2014   lung   Hypertension    PAF (paroxysmal atrial fibrillation) (HCC)    Shortness of breath dyspnea    on exertion   Sleep apnea    cpap    Surgical History: Past Surgical History:  Procedure Laterality Date   CARDIAC CATHETERIZATION     CHOLECYSTECTOMY     COLONOSCOPY N/A 04/05/2022   Procedure: COLONOSCOPY;  Surgeon: Maryruth Ole DASEN, MD;  Location: [facility];  Service: Endoscopy;  Laterality: N/A;   COLONOSCOPY WITH PROPOFOL  N/A 07/28/2015   Procedure: COLONOSCOPY WITH PROPOFOL ;  Surgeon: Rogelia Copping, MD;  Location[facility];  Service: Endoscopy;  Laterality: N/A;  Insulin  dependent diabetic and CPAP   CYSTOSCOPY  06/03/2020   Procedure: CYSTOSCOPY FLEXIBLE;  Surgeon: Twylla Glendia BROCKS, MD;  Location: [facility];  Service: Urology;;    ESOPHAGOGASTRODUODENOSCOPY (EGD) WITH PROPOFOL  N/A 07/23/2015   Procedure: ESOPHAGOGASTRODUODENOSCOPY (EGD) WITH PROPOFOL ;  Surgeon: Rogelia Copping, MD;  Location: [facility];  Service: Endoscopy;  Laterality: N/A;   KNEE SURGERY Left    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 SWITCH)     MASS EXCISION N/A 02/27/2018   Procedure: EXCISION MASS LEFT LEG;  Surgeon: Rodolph Romano, MD;  Location: [facility];  Service: General;  Laterality: N/A;   PENILE PROSTHESIS IMPLANT     new implant put in 01/19/2021 no implant card available   POLYPECTOMY  07/28/2015   Procedure: POLYPECTOMY;  Surgeon: Rogelia Copping, MD;  Location: [facility];  Service: Endoscopy;;   REMOVAL OF PENILE PROSTHESIS N/A 06/03/2020   Procedure: REMOVAL OF PENILE PROSTHESIS;  Surgeon: Twylla Glendia BROCKS, MD;  Location: [facility];  Service: Urology;  Laterality: N/A;   ROTATOR CUFF REPAIR Right     Home Medications:  Allergies as of 01/03/2024   No Known Allergies      Medication List        Accurate as of January 03, 2024  8:26 AM. If you have any questions, ask your nurse or doctor.          atorvastatin  20 MG tablet Commonly known as: LIPITOR Take 20 mg by mouth every evening.   b complex vitamins tablet Take 1 tablet by mouth daily.   CALCIUM  600+D3 PO Take 1 tablet by mouth daily.   docusate sodium 100 MG capsule Commonly known as: COLACE Take 100  mg by mouth at bedtime.   insulin  lispro 100 UNIT/ML KwikPen Commonly known as: HUMALOG Inject 15 Units into the skin 2 (two) times daily before lunch and supper.   MULTIVITAMIN PO Take 2 tablets by mouth daily.   propranolol  10 MG tablet Commonly known as: INDERAL  Take 10 mg by mouth 2 (two) times daily.   tamsulosin  0.4 MG Caps capsule Commonly known as: FLOMAX  Take 1 capsule (0.4 mg total) by mouth daily.   Toujeo  SoloStar 300 UNIT/ML Solostar Pen Generic drug: insulin  glargine (1 Unit Dial) Inject 36 Units  into the skin every evening.   warfarin 2.5 MG tablet Commonly known as: COUMADIN  Take 2 mg by mouth.        Allergies: No Known Allergies  Family History: Family History  Problem Relation Age of Onset   Diabetes Father    Heart attack Maternal Uncle    Heart attack Maternal Grandfather     Social History:  reports that he has never smoked. He has never used smokeless tobacco. He reports that he does not drink alcohol and does not use drugs.   Physical Exam: BP (!) 157/82   Pulse 68   Ht 5' 7 (1.702 m)   Wt 238 lb (108 kg)   BMI 37.28 kg/m   Constitutional:  Alert, No acute distress. HEENT: [place] AT Respiratory: Normal respiratory effort, no increased work of breathing. Psychiatric: Normal mood and affect.   Assessment & Plan:    1.  BPH with LUTS As noted some worsening of his voiding symptoms though presently not bothersome enough that he desires to pursue an outlet procedure.  We discussed recommendation of cystoscopy if he elected to pursue in the future He was interested in titrating his tamsulosin  to 0.8 mg and 30-day Rx was sent to pharmacy.  If he notes significant improvement he will call back for Rx Continue annual follow-up   [name], MD  [facility] [address] [place], [place] [address] [phone]

## 2024-01-11 ENCOUNTER — Ambulatory Visit: Payer: Self-pay | Admitting: Urology

## 2024-02-02 ENCOUNTER — Other Ambulatory Visit: Payer: Self-pay | Admitting: *Deleted

## 2024-02-02 MED ORDER — TAMSULOSIN HCL 0.4 MG PO CAPS: 0.8000 mg | ORAL_CAPSULE | Freq: Every day | ORAL | 3 refills | Status: AC

## 2024-03-16 ENCOUNTER — Other Ambulatory Visit: Payer: Self-pay | Admitting: Neurology

## 2024-03-16 DIAGNOSIS — R251 Tremor, unspecified: Secondary | ICD-10-CM

## 2024-03-16 DIAGNOSIS — E559 Vitamin D deficiency, unspecified: Secondary | ICD-10-CM

## 2024-03-16 DIAGNOSIS — E538 Deficiency of other specified B group vitamins: Secondary | ICD-10-CM

## 2024-03-19 ENCOUNTER — Ambulatory Visit
Admission: RE | Admit: 2024-03-19 | Discharge: 2024-03-19 | Disposition: A | Source: Ambulatory Visit | Attending: Neurology | Admitting: Neurology

## 2024-03-19 DIAGNOSIS — E559 Vitamin D deficiency, unspecified: Secondary | ICD-10-CM

## 2024-03-19 DIAGNOSIS — R251 Tremor, unspecified: Secondary | ICD-10-CM

## 2024-03-19 DIAGNOSIS — E538 Deficiency of other specified B group vitamins: Secondary | ICD-10-CM

## 2025-01-02 ENCOUNTER — Ambulatory Visit: Admitting: Urology
